# Patient Record
Sex: Male | Born: 1960 | Race: White | Hispanic: No | Marital: Married | Smoking: Current every day smoker
Health system: Southern US, Community
[De-identification: ages and names within clinical notes are randomized; demographics above are authoritative.]

## PROBLEM LIST (undated history)

## (undated) ENCOUNTER — Telehealth

## (undated) ENCOUNTER — Ambulatory Visit: Attending: Gastroenterology | Primary: Gastroenterology

## (undated) DIAGNOSIS — F419 Anxiety disorder, unspecified: Secondary | ICD-10-CM

## (undated) DIAGNOSIS — I10 Essential (primary) hypertension: Secondary | ICD-10-CM

## (undated) DIAGNOSIS — B1910 Unspecified viral hepatitis B without hepatic coma: Secondary | ICD-10-CM

## (undated) DIAGNOSIS — G2581 Restless legs syndrome: Secondary | ICD-10-CM

## (undated) DIAGNOSIS — T8859XA Other complications of anesthesia, initial encounter: Secondary | ICD-10-CM

## (undated) DIAGNOSIS — R0602 Shortness of breath: Secondary | ICD-10-CM

## (undated) DIAGNOSIS — R55 Syncope and collapse: Secondary | ICD-10-CM

## (undated) DIAGNOSIS — J189 Pneumonia, unspecified organism: Secondary | ICD-10-CM

## (undated) DIAGNOSIS — K219 Gastro-esophageal reflux disease without esophagitis: Secondary | ICD-10-CM

## (undated) DIAGNOSIS — B1920 Unspecified viral hepatitis C without hepatic coma: Secondary | ICD-10-CM

## (undated) DIAGNOSIS — T4145XA Adverse effect of unspecified anesthetic, initial encounter: Secondary | ICD-10-CM

## (undated) DIAGNOSIS — T148XXD Other injury of unspecified body region, subsequent encounter: Secondary | ICD-10-CM

## (undated) DIAGNOSIS — R51 Headache: Secondary | ICD-10-CM

## (undated) DIAGNOSIS — M1990 Unspecified osteoarthritis, unspecified site: Secondary | ICD-10-CM

## (undated) DIAGNOSIS — S0571XA Avulsion of right eye, initial encounter: Secondary | ICD-10-CM

## (undated) DIAGNOSIS — K7460 Unspecified cirrhosis of liver: Secondary | ICD-10-CM

## (undated) HISTORY — PX: PERIPHERALLY INSERTED CENTRAL CATHETER INSERTION: SHX2221

## (undated) HISTORY — PX: OTHER SURGICAL HISTORY: SHX169

## (undated) HISTORY — PX: ANKLE SURGERY: SHX546

## (undated) HISTORY — PX: APPENDECTOMY: SHX54

## (undated) HISTORY — PX: EYE SURGERY: SHX253

---

## 1999-08-30 ENCOUNTER — Encounter: Payer: Self-pay | Admitting: Emergency Medicine

## 1999-08-30 ENCOUNTER — Emergency Department (HOSPITAL_COMMUNITY): Admission: EM | Admit: 1999-08-30 | Discharge: 1999-08-30 | Payer: Self-pay | Admitting: Emergency Medicine

## 2008-08-04 ENCOUNTER — Emergency Department (HOSPITAL_COMMUNITY): Admission: EM | Admit: 2008-08-04 | Discharge: 2008-08-04 | Payer: Self-pay | Admitting: Emergency Medicine

## 2008-08-09 ENCOUNTER — Emergency Department (HOSPITAL_COMMUNITY): Admission: EM | Admit: 2008-08-09 | Discharge: 2008-08-09 | Payer: Self-pay | Admitting: Emergency Medicine

## 2008-08-26 ENCOUNTER — Emergency Department (HOSPITAL_COMMUNITY): Admission: EM | Admit: 2008-08-26 | Discharge: 2008-08-26 | Payer: Self-pay | Admitting: Emergency Medicine

## 2008-09-03 ENCOUNTER — Emergency Department (HOSPITAL_COMMUNITY): Admission: EM | Admit: 2008-09-03 | Discharge: 2008-09-03 | Payer: Self-pay | Admitting: Emergency Medicine

## 2008-09-15 ENCOUNTER — Emergency Department (HOSPITAL_COMMUNITY): Admission: EM | Admit: 2008-09-15 | Discharge: 2008-09-15 | Payer: Self-pay | Admitting: Emergency Medicine

## 2008-09-20 ENCOUNTER — Emergency Department (HOSPITAL_COMMUNITY): Admission: EM | Admit: 2008-09-20 | Discharge: 2008-09-20 | Payer: Self-pay | Admitting: Emergency Medicine

## 2008-09-27 ENCOUNTER — Emergency Department (HOSPITAL_COMMUNITY): Admission: EM | Admit: 2008-09-27 | Discharge: 2008-09-28 | Payer: Self-pay | Admitting: Emergency Medicine

## 2008-09-29 ENCOUNTER — Encounter: Payer: Self-pay | Admitting: Internal Medicine

## 2008-10-23 ENCOUNTER — Emergency Department (HOSPITAL_COMMUNITY): Admission: EM | Admit: 2008-10-23 | Discharge: 2008-10-23 | Payer: Self-pay | Admitting: Emergency Medicine

## 2008-11-19 ENCOUNTER — Emergency Department (HOSPITAL_COMMUNITY): Admission: EM | Admit: 2008-11-19 | Discharge: 2008-11-19 | Payer: Self-pay | Admitting: Emergency Medicine

## 2008-12-12 ENCOUNTER — Emergency Department (HOSPITAL_COMMUNITY): Admission: EM | Admit: 2008-12-12 | Discharge: 2008-12-12 | Payer: Self-pay | Admitting: Emergency Medicine

## 2010-06-04 ENCOUNTER — Inpatient Hospital Stay (HOSPITAL_COMMUNITY)
Admission: EM | Admit: 2010-06-04 | Discharge: 2010-06-10 | Payer: Self-pay | Source: Home / Self Care | Admitting: Emergency Medicine

## 2010-06-18 ENCOUNTER — Emergency Department (HOSPITAL_COMMUNITY): Admission: EM | Admit: 2010-06-18 | Discharge: 2010-06-18 | Payer: Self-pay | Admitting: Emergency Medicine

## 2010-07-10 ENCOUNTER — Emergency Department (HOSPITAL_COMMUNITY): Admission: EM | Admit: 2010-07-10 | Discharge: 2010-07-10 | Payer: Self-pay | Admitting: Emergency Medicine

## 2010-07-29 ENCOUNTER — Emergency Department (HOSPITAL_COMMUNITY)
Admission: EM | Admit: 2010-07-29 | Discharge: 2010-07-29 | Payer: Self-pay | Source: Home / Self Care | Admitting: Emergency Medicine

## 2010-10-13 ENCOUNTER — Emergency Department (HOSPITAL_COMMUNITY)
Admission: EM | Admit: 2010-10-13 | Discharge: 2010-10-13 | Payer: Self-pay | Source: Home / Self Care | Admitting: Emergency Medicine

## 2010-10-16 LAB — COMPREHENSIVE METABOLIC PANEL
ALT: 89 U/L — ABNORMAL HIGH (ref 0–53)
AST: 104 U/L — ABNORMAL HIGH (ref 0–37)
Alkaline Phosphatase: 144 U/L — ABNORMAL HIGH (ref 39–117)
CO2: 21 mEq/L (ref 19–32)
Calcium: 9.1 mg/dL (ref 8.4–10.5)
GFR calc Af Amer: >60 mL/min (ref >60)
Glucose, Bld: 95 mg/dL (ref 70–99)
Potassium: 4 mEq/L (ref 3.5–5.1)
Sodium: 136 mEq/L (ref 135–145)
Total Protein: 8 g/dL (ref 6.0–8.3)

## 2010-10-16 LAB — DIFFERENTIAL
Eosinophils Relative: 1 % (ref 0–5)
Lymphocytes Relative: 5 % — ABNORMAL LOW (ref 12–46)
Lymphs Abs: 0.4 10*3/uL — ABNORMAL LOW (ref 0.7–4.0)
Monocytes Absolute: 0.4 10*3/uL (ref 0.1–1.0)
Neutro Abs: 6.4 10*3/uL (ref 1.7–7.7)

## 2010-10-16 LAB — CBC
HCT: 44.1 % (ref 39.0–52.0)
Hemoglobin: 15.4 g/dL (ref 13.0–17.0)
MCHC: 34.9 g/dL (ref 30.0–36.0)
MCV: 89.5 fL (ref 78.0–100.0)
RDW: 14.7 % (ref 11.5–15.5)

## 2010-10-16 LAB — RAPID URINE DRUG SCREEN, HOSP PERFORMED
Amphetamines: NOT DETECTED
Barbiturates: NOT DETECTED
Benzodiazepines: NOT DETECTED
Cocaine: POSITIVE — AB
Opiates: POSITIVE — AB
Tetrahydrocannabinol: NOT DETECTED

## 2010-10-16 LAB — URINALYSIS, ROUTINE W REFLEX MICROSCOPIC
Bilirubin Urine: NEGATIVE
Hgb urine dipstick: NEGATIVE
Specific Gravity, Urine: 1.02 (ref 1.005–1.030)
Urine Glucose, Fasting: NEGATIVE mg/dL

## 2010-10-16 LAB — PROTIME-INR: INR: 1.02 (ref 0.00–1.49)

## 2010-10-16 LAB — AMMONIA: Ammonia: 44 umol/L — ABNORMAL HIGH (ref 11–35)

## 2010-10-16 LAB — ETHANOL: Alcohol, Ethyl (B): <5 mg/dL (ref 0–10)

## 2010-10-20 LAB — CULTURE, BLOOD (ROUTINE X 2)
Culture  Setup Time: 201201210239
Culture: NO GROWTH

## 2010-11-13 ENCOUNTER — Inpatient Hospital Stay (HOSPITAL_COMMUNITY)
Admission: EM | Admit: 2010-11-13 | Discharge: 2010-11-17 | DRG: 513 | Disposition: A | Discharge status: Home Health | Payer: Medicaid Other | Attending: Plastic Surgery | Admitting: Plastic Surgery

## 2010-11-13 ENCOUNTER — Emergency Department (HOSPITAL_COMMUNITY): Payer: Medicaid Other

## 2010-11-13 DIAGNOSIS — F3289 Other specified depressive episodes: Secondary | ICD-10-CM | POA: Diagnosis present

## 2010-11-13 DIAGNOSIS — M65839 Other synovitis and tenosynovitis, unspecified forearm: Principal | ICD-10-CM | POA: Diagnosis present

## 2010-11-13 DIAGNOSIS — Z8619 Personal history of other infectious and parasitic diseases: Secondary | ICD-10-CM

## 2010-11-13 DIAGNOSIS — R112 Nausea with vomiting, unspecified: Secondary | ICD-10-CM | POA: Diagnosis not present

## 2010-11-13 DIAGNOSIS — W268XXA Contact with other sharp object(s), not elsewhere classified, initial encounter: Secondary | ICD-10-CM | POA: Diagnosis present

## 2010-11-13 DIAGNOSIS — G2581 Restless legs syndrome: Secondary | ICD-10-CM | POA: Diagnosis present

## 2010-11-13 DIAGNOSIS — R197 Diarrhea, unspecified: Secondary | ICD-10-CM | POA: Diagnosis not present

## 2010-11-13 DIAGNOSIS — I10 Essential (primary) hypertension: Secondary | ICD-10-CM | POA: Diagnosis present

## 2010-11-13 DIAGNOSIS — K7460 Unspecified cirrhosis of liver: Secondary | ICD-10-CM | POA: Diagnosis present

## 2010-11-13 DIAGNOSIS — F329 Major depressive disorder, single episode, unspecified: Secondary | ICD-10-CM | POA: Diagnosis present

## 2010-11-13 DIAGNOSIS — S61209A Unspecified open wound of unspecified finger without damage to nail, initial encounter: Secondary | ICD-10-CM | POA: Diagnosis present

## 2010-11-13 DIAGNOSIS — M65849 Other synovitis and tenosynovitis, unspecified hand: Principal | ICD-10-CM | POA: Diagnosis present

## 2010-11-13 LAB — BASIC METABOLIC PANEL
BUN: 20 mg/dL (ref 6–23)
Creatinine, Ser: 0.99 mg/dL (ref 0.4–1.5)
GFR calc Af Amer: >60 mL/min (ref >60)
GFR calc non Af Amer: >60 mL/min (ref >60)
Potassium: 3.6 mEq/L (ref 3.5–5.1)

## 2010-11-13 LAB — CBC
MCV: 89.4 fL (ref 78.0–100.0)
Platelets: 124 10*3/uL — ABNORMAL LOW (ref 150–400)
RDW: 14.1 % (ref 11.5–15.5)
WBC: 8.5 10*3/uL (ref 4.0–10.5)

## 2010-11-13 LAB — DIFFERENTIAL
Basophils Absolute: 0 10*3/uL (ref 0.0–0.1)
Basophils Relative: 0 % (ref 0–1)
Eosinophils Absolute: 0.2 10*3/uL (ref 0.0–0.7)
Eosinophils Relative: 2 % (ref 0–5)
Lymphs Abs: 2 10*3/uL (ref 0.7–4.0)
Neutrophils Relative %: 68 % (ref 43–77)

## 2010-11-14 LAB — CBC
HCT: 40.8 % (ref 39.0–52.0)
Platelets: 98 10*3/uL — ABNORMAL LOW (ref 150–400)
RBC: 4.54 MIL/uL (ref 4.22–5.81)
RDW: 14.2 % (ref 11.5–15.5)
WBC: 6.4 10*3/uL (ref 4.0–10.5)

## 2010-11-14 LAB — RAPID URINE DRUG SCREEN, HOSP PERFORMED: Benzodiazepines: POSITIVE — AB

## 2010-11-14 LAB — MRSA PCR SCREENING: MRSA by PCR: NEGATIVE

## 2010-11-15 LAB — CBC
Hemoglobin: 14.3 g/dL (ref 13.0–17.0)
MCV: 90.1 fL (ref 78.0–100.0)
Platelets: 117 10*3/uL — ABNORMAL LOW (ref 150–400)
RBC: 4.74 MIL/uL (ref 4.22–5.81)
WBC: 6 10*3/uL (ref 4.0–10.5)

## 2010-11-17 LAB — CBC
HCT: 45.3 % (ref 39.0–52.0)
MCHC: 33.8 g/dL (ref 30.0–36.0)
MCV: 90.4 fL (ref 78.0–100.0)
Platelets: 142 10*3/uL — ABNORMAL LOW (ref 150–400)
RDW: 13.8 % (ref 11.5–15.5)
WBC: 5.4 10*3/uL (ref 4.0–10.5)

## 2010-11-17 LAB — WOUND CULTURE

## 2010-11-17 NOTE — Consult Note (Signed)
NAMEJEOVANNI, HEURING NO.:  [id number]  MEDICAL RECORD NO.:  [id number]           PATIENT TYPE:  I  LOCATION:  1305                         FACILITY:  [facility]  PHYSICIAN:  Hillery Aldo, M.D.   DATE OF BIRTH:  07-18-1961  DATE OF CONSULTATION:  11/14/2010 DATE OF DISCHARGE:                                CONSULTATION   PERSON REQUESTING CONSULTATION:  Dr. Noelle Penner.  REASON FOR CONSULTATION:  Medical management.  HISTORY OF PRESENT ILLNESS:  The patient is a 50 year old male, with past medical history of cirrhosis and necrotizing fasciitis involving the right forearm, who was admitted by the orthopedic service yesterday secondary to cellulitis and right index finger trauma.  The patient was apparently working at a job site and reached down to pick something up and sustained a small cut, which subsequently began to swell and drained pus.  Subsequently was admitted by Dr. Noelle Penner of the orthopedic service and diagnosed with right index finger flexor tenosynovitis with foreign body.  He is now status post incision and drainage and is stable postoperatively, receiving wound care with whirlpool therapy.  The orthopedic service did ask for Internal Medicine input regarding the management of his chronic medical problems, including cirrhosis.  PAST MEDICAL HISTORY: 1. Necrotizing fasciitis/cellulitis/abscess of the right forearm     requiring three separate incision and drainage procedures.     Cultures grew microaerophilic strep. 2. 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 3. History of hypertension. 4. Gastroesophageal reflux disease. 5. 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 6. 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 7. Appendectomy.  SOCIAL HISTORY:  The patient is married and has worked as a Pensions consultant.  He states that he is attempting to apply for disability.  He smokes a half- a-pack of tobacco  daily.  He denies current alcohol and drug use, but has a history of heavy drug use including cocaine.  Of note, the patient was admitted back in November and had a urine drug screen positive for cocaine at that time.  FAMILY HISTORY:  The patient's mother is alive at 31 and is healthy. The patient's father died at 74 from pneumonia.  He was also alcoholic. He has three siblings who died of a crib death early in life.  He has two sisters who are alive and healthy.  CURRENT MEDICATIONS: 1. Amitriptyline 25 mg p.o. daily. 2. Hydroxyzine 25 mg p.o. q.h.s. 3. Inderal 60 mg p.o. daily. 4. Lactulose 10 g 1 to 2 teaspoons t.i.d. 5. Nexium 40 mg p.o. b.i.d. 6. Ropinirole 3 mg p.o. q.h.s. 7. Spironolactone 50 mg p.o. daily.  PHYSICAL EXAM:  VITAL SIGNS:  Temperature 97.8, blood pressure 140/88, pulse 87, respirations 20, O2 saturation 95% on room air. GENERAL:  Well-developed, well-nourished male who is in no acute distress. HEENT:  Normocephalic, atraumatic.  EOMI.  Right cornea is opaque.  Left pupil reacts to light.  Oropharynx is clear. NECK:  Supple, no thyromegaly, no lymphadenopathy, no jugular venous distention. CHEST:  Lungs clear to auscultation bilaterally with good air movement. HEART:  Regular rate, rhythm.  No murmurs, rubs, gallops. ABDOMEN:  Soft, nontender, nondistended.  Normoactive bowel sounds. EXTREMITIES:  Trace edema bilaterally.  He does have two incisions in the right hand, one to the right index finger and the other in the palm of the hand, that are clean and are being actively addressed by the Wound Care staff. SKIN:  Warm and dry.  No rashes.  Multiple tattoos. NEUROLOGIC:  The patient is alert and oriented x3.  Cranial nerves 2-12 grossly intact.  Nonfocal.  ASSESSMENT/PLAN: 1. Flexor tenosynovitis of the right index finger status post incision     and drainage:  The patient will be continued on antibiotics and     wound care per the orthopedic  service. 2. 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 3. Gastroesophageal reflux disease:  We will resume the patient's     proton pump inhibitor therapy. 4. History of hypertension:  The patient will be resumed on his     spironolactone. 5. History of drug abuse:  Check a urine drug screen, as the patient     is currently denying active use but he does have a cocaine-positive     urine documented back in November.  If positive, would refer to an     outpatient chemical dependency program.  Thank you for this consultation.  We will follow the patient with you.  Time spent on consultation including face-to-face time:  Approximately 1 hour.     Hillery Aldo, M.D.     CR/MEDQ  D:  11/14/2010  T:  11/14/2010  Job:  [id number]  Electronically Signed by Hillery Aldo M.D. on 11/17/2010 03:13:12 PM

## 2010-11-19 LAB — ANAEROBIC CULTURE

## 2010-11-22 NOTE — Discharge Summary (Signed)
NAMEKADIN, CANIPE NO.:  [id number]  MEDICAL RECORD NO.:  [id number]           PATIENT TYPE:  I  LOCATION:  1305                         FACILITY:  [facility]  PHYSICIAN:  Loreta Ave, MD DATE OF BIRTH:  [birth year]  DATE OF ADMISSION:  11/13/2010 DATE OF DISCHARGE:  11/17/2010                              DISCHARGE SUMMARY   PRIMARY ADMITTING DIAGNOSES: 1. Right index finger flexor tenosynovitis. 2. Right index finger foreign body.  ADDITIONAL DISCHARGE DIAGNOSES: 1. Cirrhosis. 2. GERD. 3. Hypertension. 4. Depression.  LABORATORY DATA:  CBC on October 13, 2010; white blood count was 8.5, hemoglobin of 14.9, hematocrit was 43.4, platelets 142,000.  CBC on November 17, 2010; white blood count of 5.4, hemoglobin of 15.3, hematocrit of 45.3, platelets 142,000.  On November 16, 2010, C diff negative.  RADIOLOGY:  Right hand 2-view, right index finger metallic foreign body. Micro wound cultures, right index finger; few staph aureus, blood cultures negative, anaerobic cultures negative.  SURGERY:  On October 14, 2010, I&D, right index finger and palm, and removal of foreign body.  CONSULTS:  Internal medicine consult on October 14, 2010 for medical management.  BRIEF HISTORY:  Mr. Espin is a 50 year old right-hand dominant male who cut his index finger on the volar aspect of the middle phalanx a few days prior to admission on a septic pipe.  He had increasing swelling, pain, and redness of the index finger with symptoms that were suggestive of flexor tenosynovitis of the index finger into the palm.  At that time, he presented to the emergency room and was evaluated by Dr. Noelle Penner for recommendations regarding his right hand due to the suggestive symptoms of the flexor tenosynovitis.  He was taken to the operating room for a debridement of the right hand and removal of foreign body in his right index finger and cultures were also obtained at that  time.  IV antibiotics were initiated with clindamycin and Unasyn.  He tolerated procedure well, was transferred to the recovery room in stable condition and later transferred to the medical-surgical unit.  He was also seen by internal medicine for medical management on November 14, 2010, he remained on clindamycin and Unasyn as well as PCA Dilaudid.  He was also started on whirlpool by physical therapist, which was continued throughout his hospitalization.  On postoperative day #1, his PCA and IV antibiotics were changed to p.o. clindamycin, Augmentin, and oxycodone. His right hand wounds were cleaned, with persistent erythema noted in his right index finger.  This continued to be packed with Iodoform gauze daily.  On postoperative day #2, he was complaining of nausea, vomiting, and diarrhea and stool samples were obtained which revealed no C difficile.  Augmentin was discontinued.  On postoperative day #3, he denied nausea, vomiting, diarrhea, was tolerating a regular diet, was afebrile, and voiding spontaneously, and managing all medications by mouth.  His right hand wound had decreased erythema and decreased swelling and no pus and minimal exudate noted from the wounds.  On postoperative day #3, he was deemed appropriate for discharge home.  DISCHARGE INSTRUCTIONS: 1. DIET:  As tolerated. 2. ACTIVITY:  As tolerated.  He may shower.  No driving while taking     narcotics. 3. FOLLOWUP:  He is to follow up with Dr. Noelle Penner on November 23, 2010 and     call to make an appointment at [phone].  He is also to follow up     with his primary physicians [facility] for followup     after hospitalization. 4. HOME HEALTH:  [facility] will follow for wound care.  DISCHARGE MEDICATIONS: 1. Clindamycin 150 mg p.o. q.8h. 2. Oxycodone 5 to 10 mg immediate release q.3h. p.r.n. pain. 3. Ropinirole 5 mg q.h.s. 4. Hydroxyzine 50 mg 2 tablets q.h.s. 5. Spironolactone 50 mg 1 tablet  b.i.d. 6. Nexium 1 tablet b.i.d. 7. Amitriptyline 25 mg 1 tablet q.h.s. 8. Fluoxetine 20 mg 1 tablet p.o. daily. 9. Lactulose 3 tablespoons p.o. b.i.d.  WOUND CARE INSTRUCTIONS:  He is to pack the right hand wounds with Iodoform and cover with dry gauze once daily. He is instructed to call Dr. Noelle Penner if he experiences any nausea, vomiting, uncontrollable pain, or any concerns he has about his right hand.  Mr. Overholser has met maximum benefit from inpatient therapy and is medically stable to be discharged home.  CONDITION:  At the time of discharge, is stable.     Tonye Becket, NP   ______________________________ Loreta Ave, MD    AC/MEDQ  D:  11/20/2010  T:  11/20/2010  Job:  [id number]  Electronically Signed by Loreta Ave MD on 11/22/2010 08:22:59 PM

## 2010-11-22 NOTE — Op Note (Signed)
NAMEGIORDAN, FORDHAM NO.:  [id number]  MEDICAL RECORD NO.:  [id number]           PATIENT TYPE:  E  LOCATION:  WLED                         FACILITY:  [facility]  PHYSICIAN:  Loreta Ave, MD DATE OF BIRTH:  Aug 15, 1961  DATE OF PROCEDURE:  11/14/2010 DATE OF DISCHARGE:                              OPERATIVE REPORT   PREOPERATIVE DIAGNOSES: 1. Right index finger flexor tenosynovitis. 2. Right index finger foreign body.  POSTOPERATIVE DIAGNOSES: 1. Right index finger flexor tenosynovitis. 2. Right index finger foreign body.  PROCEDURE PERFORMED: 1. Incision and drainage of tendon sheath right index finger and palm. 2. Removal of foreign body right index finger tendon sheath.  TOURNIQUET TIME:  20 minutes at 275 mmHg.  ESTIMATED BLOOD LOSS:  Minimal.  IV FLUIDS:  Per Anesthesia.  URINE OUTPUT:  Not recorded.  SPECIMENS:  Cultures were taken from the index finger and sent for microbiology. CLINICAL INDICATION:  Mark Christensen is a 50 year old right-hand-dominant male who cut his right index finger on the volar aspect of the middle phalanx a few days ago.  He has had increasing pain, swelling, and erythema of the index finger and symptoms suggestive of flexor tenosynovitis of the index finger into the palm.  He understands the risks of the surgery to include but not be limited to bleeding, infection, damage to the nearby structures, stiffness, scarring, and the need for more surgery including potentially amputations.  He desires to proceed.  DESCRIPTION OF OPERATION:  The patient was brought to the operating room, placed in supine position on the operating room table.  After smooth and routine induction of general anesthesia, a surgical time-out was performed.  The right upper extremity had a well-padded pneumatic tourniquet placed on the arm.  The right upper extremity was then prepped with Betadine scrub and paint, draped into a sterile field.   It was elevated for 1 minute with compression of the brachial artery and the antecubital fossa and the tourniquet was then inflated.  Attention was first turned to the level of the palm to evaluate the proximal spread of infection.  A 1.5-cm oblique incision was made with a 15 blade overlying the A1 pulley.  Blunt dissection was carried down to the A1 pulley and this was incised in its entirety with tenotomy scissors. There is no return of fluid at this location.  Next, attention was turned to the index finger.  The wound overlying the middle phalanx which was approximately 2 x 7 mm of dry eschar was excised with a 15 blade.  This was extended towards the radial border of the finger with a 15 blade.  Next,  blunt dissection was carried down to the level of the tendon sheath and a small metallic foreign body was encountered and imbedded in the tendon sheath.  This was removed.  Next, the tendon sheath was incised and there was a return of turbid brown fluid.  This was cultured and sent for microbiology for Gram stain and aerobic and anaerobic cultures.  Next, the index finger and palmar wounds were irrigated with 1 liter of normal saline and a syringe was used  to irrigate the tendon sheath from both directions.  Next,  the wounds were packed with quarter inch Iodoform gauze, and the wounds were covered with 4x4s and Kerlix wrap and the patient was placed in the volar resting splint in the position of function.  Sponge and needle counts reported as correct x2.  The tourniquet was then deflated with prompt return of capillary refill to all fingertips of the right hand.     Loreta Ave, MD     CF/MEDQ  D:  11/14/2010  T:  11/14/2010  Job:  [id number]  Electronically Signed by Loreta Ave MD on 11/22/2010 08:22:56 PM

## 2010-12-05 LAB — DIFFERENTIAL
Basophils Absolute: 0 10*3/uL (ref 0.0–0.1)
Basophils Relative: 0 % (ref 0–1)
Eosinophils Absolute: 0.2 K/uL (ref 0.0–0.7)
Eosinophils Relative: 3 % (ref 0–5)
Lymphocytes Relative: 37 % (ref 12–46)
Lymphs Abs: 1.9 K/uL (ref 0.7–4.0)
Monocytes Absolute: 0.5 10*3/uL (ref 0.1–1.0)
Monocytes Relative: 9 % (ref 3–12)
Neutro Abs: 2.6 10*3/uL (ref 1.7–7.7)
Neutrophils Relative %: 51 % (ref 43–77)

## 2010-12-05 LAB — BASIC METABOLIC PANEL WITH GFR
CO2: 25 meq/L (ref 19–32)
Chloride: 107 meq/L (ref 96–112)
GFR calc Af Amer: >60 mL/min (ref >60)
Sodium: 141 meq/L (ref 135–145)

## 2010-12-05 LAB — CBC
HCT: 45.7 % (ref 39.0–52.0)
Hemoglobin: 15.6 g/dL (ref 13.0–17.0)
MCH: 31.3 pg (ref 26.0–34.0)
MCHC: 34.2 g/dL (ref 30.0–36.0)
MCV: 91.5 fL (ref 78.0–100.0)
Platelets: 147 10*3/uL — ABNORMAL LOW (ref 150–400)
RBC: 4.99 MIL/uL (ref 4.22–5.81)
RDW: 14.1 % (ref 11.5–15.5)
WBC: 5.2 10*3/uL (ref 4.0–10.5)

## 2010-12-05 LAB — BASIC METABOLIC PANEL
BUN: 13 mg/dL (ref 6–23)
Calcium: 9.7 mg/dL (ref 8.4–10.5)
Creatinine, Ser: 0.9 mg/dL (ref 0.4–1.5)
GFR calc non Af Amer: >60 mL/min (ref >60)
Glucose, Bld: 92 mg/dL (ref 70–99)
Potassium: 4.4 mEq/L (ref 3.5–5.1)

## 2010-12-05 LAB — SEDIMENTATION RATE: Sed Rate: 18 mm/hr — ABNORMAL HIGH (ref 0–16)

## 2010-12-06 LAB — DIFFERENTIAL
Basophils Absolute: 0 10*3/uL (ref 0.0–0.1)
Basophils Relative: 1 % (ref 0–1)
Eosinophils Relative: 3 % (ref 0–5)
Monocytes Absolute: 0.4 10*3/uL (ref 0.1–1.0)

## 2010-12-06 LAB — GLUCOSE, CAPILLARY: Glucose-Capillary: 134 mg/dL — ABNORMAL HIGH (ref 70–99)

## 2010-12-06 LAB — URINALYSIS, ROUTINE W REFLEX MICROSCOPIC
Hgb urine dipstick: NEGATIVE
Nitrite: NEGATIVE
Protein, ur: NEGATIVE mg/dL
Specific Gravity, Urine: 1.02 (ref 1.005–1.030)
Urobilinogen, UA: 1 mg/dL (ref 0.0–1.0)

## 2010-12-06 LAB — CBC
HCT: 42.5 % (ref 39.0–52.0)
MCHC: 34.9 g/dL (ref 30.0–36.0)
MCV: 91.5 fL (ref 78.0–100.0)
Platelets: 140 10*3/uL — ABNORMAL LOW (ref 150–400)
RDW: 13.1 % (ref 11.5–15.5)

## 2010-12-06 LAB — AMMONIA: Ammonia: 38 umol/L — ABNORMAL HIGH (ref 11–35)

## 2010-12-06 LAB — BASIC METABOLIC PANEL
BUN: 12 mg/dL (ref 6–23)
Chloride: 112 mEq/L (ref 96–112)
Creatinine, Ser: 0.7 mg/dL (ref 0.4–1.5)
GFR calc non Af Amer: >60 mL/min (ref >60)
Glucose, Bld: 111 mg/dL — ABNORMAL HIGH (ref 70–99)
Potassium: 4.9 mEq/L (ref 3.5–5.1)

## 2010-12-06 LAB — HEPATIC FUNCTION PANEL
Bilirubin, Direct: 0.3 mg/dL (ref 0.0–0.3)
Indirect Bilirubin: 0.6 mg/dL (ref 0.3–0.9)
Total Protein: 8.4 g/dL — ABNORMAL HIGH (ref 6.0–8.3)

## 2010-12-06 LAB — LIPASE, BLOOD: Lipase: 28 U/L (ref 11–59)

## 2010-12-07 LAB — PROTIME-INR
INR: 1.08 (ref 0.00–1.49)
Prothrombin Time: 14.2 seconds (ref 11.6–15.2)

## 2010-12-07 LAB — DIFFERENTIAL
Basophils Relative: 0 % (ref 0–1)
Basophils Relative: 0 % (ref 0–1)
Basophils Relative: 0 % (ref 0–1)
Basophils Relative: 1 % (ref 0–1)
Eosinophils Absolute: 0.1 10*3/uL (ref 0.0–0.7)
Eosinophils Absolute: 0.1 10*3/uL (ref 0.0–0.7)
Eosinophils Absolute: 0.1 10*3/uL (ref 0.0–0.7)
Eosinophils Absolute: 0.2 10*3/uL (ref 0.0–0.7)
Eosinophils Relative: 5 % (ref 0–5)
Lymphocytes Relative: 45 % (ref 12–46)
Lymphs Abs: 2.9 10*3/uL (ref 0.7–4.0)
Monocytes Absolute: 0.6 10*3/uL (ref 0.1–1.0)
Monocytes Absolute: 0.6 10*3/uL (ref 0.1–1.0)
Monocytes Absolute: 0.9 10*3/uL (ref 0.1–1.0)
Monocytes Absolute: 1 10*3/uL (ref 0.1–1.0)
Monocytes Relative: 8 % (ref 3–12)
Monocytes Relative: 9 % (ref 3–12)
Monocytes Relative: 11 % (ref 3–12)
Monocytes Relative: 8 % (ref 3–12)
Neutro Abs: 7.1 10*3/uL (ref 1.7–7.7)
Neutro Abs: 7.3 10*3/uL (ref 1.7–7.7)
Neutrophils Relative %: 41 % — ABNORMAL LOW (ref 43–77)
Neutrophils Relative %: 63 % (ref 43–77)

## 2010-12-07 LAB — CULTURE, BLOOD (ROUTINE X 2): Culture: NO GROWTH

## 2010-12-07 LAB — CBC
HCT: 43.8 % (ref 39.0–52.0)
HCT: 44.2 % (ref 39.0–52.0)
HCT: 45.6 % (ref 39.0–52.0)
Hemoglobin: 13.3 g/dL (ref 13.0–17.0)
Hemoglobin: 15.5 g/dL (ref 13.0–17.0)
Hemoglobin: 15.5 g/dL (ref 13.0–17.0)
MCH: 32.2 pg (ref 26.0–34.0)
MCH: 32.2 pg (ref 26.0–34.0)
MCH: 32.5 pg (ref 26.0–34.0)
MCH: 32.9 pg (ref 26.0–34.0)
MCHC: 34.1 g/dL (ref 30.0–36.0)
MCHC: 34.6 g/dL (ref 30.0–36.0)
MCHC: 35 g/dL (ref 30.0–36.0)
MCV: 93.9 fL (ref 78.0–100.0)
MCV: 94 fL (ref 78.0–100.0)
RBC: 4.2 MIL/uL — ABNORMAL LOW (ref 4.22–5.81)
RBC: 4.67 MIL/uL (ref 4.22–5.81)
RDW: 13.3 % (ref 11.5–15.5)
RDW: 13.4 % (ref 11.5–15.5)
WBC: 6.4 10*3/uL (ref 4.0–10.5)
WBC: 8.1 10*3/uL (ref 4.0–10.5)

## 2010-12-07 LAB — COMPREHENSIVE METABOLIC PANEL
ALT: 36 U/L (ref 0–53)
ALT: 40 U/L (ref 0–53)
AST: 35 U/L (ref 0–37)
Albumin: 3.6 g/dL (ref 3.5–5.2)
Alkaline Phosphatase: 106 U/L (ref 39–117)
BUN: 7 mg/dL (ref 6–23)
BUN: 8 mg/dL (ref 6–23)
CO2: 22 mEq/L (ref 19–32)
CO2: 24 mEq/L (ref 19–32)
Calcium: 8.4 mg/dL (ref 8.4–10.5)
Calcium: 8.7 mg/dL (ref 8.4–10.5)
Creatinine, Ser: 0.74 mg/dL (ref 0.4–1.5)
GFR calc Af Amer: >60 mL/min (ref >60)
GFR calc non Af Amer: >60 mL/min (ref >60)
GFR calc non Af Amer: >60 mL/min (ref >60)
Glucose, Bld: 106 mg/dL — ABNORMAL HIGH (ref 70–99)
Glucose, Bld: 105 mg/dL — ABNORMAL HIGH (ref 70–99)
Potassium: 4.3 mEq/L (ref 3.5–5.1)
Sodium: 135 mEq/L (ref 135–145)
Sodium: 132 mEq/L — ABNORMAL LOW (ref 135–145)
Total Protein: 8.6 g/dL — ABNORMAL HIGH (ref 6.0–8.3)
Total Protein: 8 g/dL (ref 6.0–8.3)

## 2010-12-07 LAB — BENZODIAZEPINE, QUANTITATIVE, URINE
Alprazolam (GC/LC/MS), ur confirm: NEGATIVE NG/ML
Nordiazepam GC/MS Conf: NEGATIVE NG/ML
Temazepam GC/MS Conf: NEGATIVE NG/ML

## 2010-12-07 LAB — URINE DRUGS OF ABUSE SCREEN W ALC, ROUTINE (REF LAB)
Barbiturate Quant, Ur: NEGATIVE
Creatinine,U: 220.2 mg/dL
Marijuana Metabolite: NEGATIVE
Opiate Screen, Urine: POSITIVE — AB

## 2010-12-07 LAB — BASIC METABOLIC PANEL
CO2: 24 mEq/L (ref 19–32)
Calcium: 9 mg/dL (ref 8.4–10.5)
Creatinine, Ser: 0.89 mg/dL (ref 0.4–1.5)
GFR calc Af Amer: >60 mL/min (ref >60)
GFR calc non Af Amer: >60 mL/min (ref >60)
Glucose, Bld: 99 mg/dL (ref 70–99)
Potassium: 4.5 mEq/L (ref 3.5–5.1)
Sodium: 138 mEq/L (ref 135–145)
Sodium: 135 mEq/L (ref 135–145)

## 2010-12-07 LAB — URINALYSIS, ROUTINE W REFLEX MICROSCOPIC
Glucose, UA: NEGATIVE mg/dL
Hgb urine dipstick: NEGATIVE
pH: 7 (ref 5.0–8.0)

## 2010-12-07 LAB — TISSUE CULTURE

## 2010-12-07 LAB — ANAEROBIC CULTURE

## 2010-12-07 LAB — OPIATE, QUANTITATIVE, URINE
Hydrocodone: NEGATIVE NG/ML
Morphine, Confirm: 1679 NG/ML — ABNORMAL HIGH

## 2010-12-07 LAB — WOUND CULTURE

## 2010-12-07 LAB — CULTURE, ROUTINE-ABSCESS

## 2010-12-07 LAB — GRAM STAIN

## 2010-12-07 LAB — LIPASE, BLOOD: Lipase: 24 U/L (ref 11–59)

## 2011-01-09 LAB — COMPREHENSIVE METABOLIC PANEL
BUN: 17 mg/dL (ref 6–23)
CO2: 23 mEq/L (ref 19–32)
Chloride: 108 mEq/L (ref 96–112)
Creatinine, Ser: 0.96 mg/dL (ref 0.4–1.5)
GFR calc non Af Amer: >60 mL/min (ref >60)
Glucose, Bld: 102 mg/dL — ABNORMAL HIGH (ref 70–99)
Total Bilirubin: 0.2 mg/dL — ABNORMAL LOW (ref 0.3–1.2)

## 2011-01-09 LAB — LIPASE, BLOOD: Lipase: 24 U/L (ref 11–59)

## 2011-01-09 LAB — POCT I-STAT, CHEM 8
BUN: 18 mg/dL (ref 6–23)
Calcium, Ion: 1.28 mmol/L (ref 1.12–1.32)
Chloride: 110 meq/L (ref 96–112)
Creatinine, Ser: 0.9 mg/dL (ref 0.4–1.5)
Glucose, Bld: 114 mg/dL — ABNORMAL HIGH (ref 70–99)
HCT: 40 % (ref 39.0–52.0)
Hemoglobin: 13.6 g/dL (ref 13.0–17.0)
Potassium: 3.8 mEq/L (ref 3.5–5.1)
Sodium: 143 mEq/L (ref 135–145)
TCO2: 24 mmol/L (ref 0–100)

## 2011-02-11 ENCOUNTER — Emergency Department (HOSPITAL_COMMUNITY)
Admission: EM | Admit: 2011-02-11 | Discharge: 2011-02-11 | Disposition: A | Discharge status: Home / Self Care | Payer: Medicaid Other | Attending: Emergency Medicine | Admitting: Emergency Medicine

## 2011-02-11 ENCOUNTER — Emergency Department (HOSPITAL_COMMUNITY): Payer: Medicaid Other

## 2011-02-11 DIAGNOSIS — I10 Essential (primary) hypertension: Secondary | ICD-10-CM | POA: Insufficient documentation

## 2011-02-11 DIAGNOSIS — K5289 Other specified noninfective gastroenteritis and colitis: Secondary | ICD-10-CM | POA: Insufficient documentation

## 2011-02-11 DIAGNOSIS — H5440 Blindness, one eye, unspecified eye: Secondary | ICD-10-CM | POA: Insufficient documentation

## 2011-02-11 DIAGNOSIS — R079 Chest pain, unspecified: Secondary | ICD-10-CM | POA: Insufficient documentation

## 2011-02-11 DIAGNOSIS — Z8619 Personal history of other infectious and parasitic diseases: Secondary | ICD-10-CM | POA: Insufficient documentation

## 2011-02-11 LAB — DIFFERENTIAL
Basophils Absolute: 0 10*3/uL (ref 0.0–0.1)
Eosinophils Relative: 0 % (ref 0–5)
Lymphocytes Relative: 22 % (ref 12–46)

## 2011-02-11 LAB — CBC
HCT: 42.8 % (ref 39.0–52.0)
Platelets: 68 10*3/uL — ABNORMAL LOW (ref 150–400)
RDW: 14.2 % (ref 11.5–15.5)
WBC: 3.7 10*3/uL — ABNORMAL LOW (ref 4.0–10.5)

## 2011-02-11 LAB — COMPREHENSIVE METABOLIC PANEL
Alkaline Phosphatase: 121 U/L — ABNORMAL HIGH (ref 39–117)
BUN: 14 mg/dL (ref 6–23)
CO2: 22 mEq/L (ref 19–32)
Chloride: 98 mEq/L (ref 96–112)
Glucose, Bld: 106 mg/dL — ABNORMAL HIGH (ref 70–99)
Potassium: 3.7 mEq/L (ref 3.5–5.1)
Total Bilirubin: 0.6 mg/dL (ref 0.3–1.2)

## 2011-02-11 LAB — POCT CARDIAC MARKERS
CKMB, poc: <1 ng/mL — ABNORMAL LOW (ref 1.0–8.0)
Myoglobin, poc: 54.4 ng/mL (ref 12–200)

## 2011-02-11 LAB — LIPASE, BLOOD: Lipase: 18 U/L (ref 11–59)

## 2011-02-11 LAB — URINALYSIS, ROUTINE W REFLEX MICROSCOPIC
Hgb urine dipstick: NEGATIVE
Protein, ur: NEGATIVE mg/dL
Urobilinogen, UA: 2 mg/dL — ABNORMAL HIGH (ref 0.0–1.0)

## 2011-06-29 LAB — URINE MICROSCOPIC-ADD ON

## 2011-06-29 LAB — URINALYSIS, ROUTINE W REFLEX MICROSCOPIC
Glucose, UA: NEGATIVE mg/dL
Hgb urine dipstick: NEGATIVE
Ketones, ur: 15 mg/dL — AB
Nitrite: NEGATIVE
Protein, ur: NEGATIVE mg/dL
Specific Gravity, Urine: >1.046 — ABNORMAL HIGH (ref 1.005–1.030)
pH: 6 (ref 5.0–8.0)

## 2011-06-29 LAB — DIFFERENTIAL
Basophils Relative: 0 % (ref 0–1)
Basophils Relative: 1 % (ref 0–1)
Lymphocytes Relative: 29 % (ref 12–46)
Lymphs Abs: 1.5 10*3/uL (ref 0.7–4.0)
Lymphs Abs: 1.9 10*3/uL (ref 0.7–4.0)
Monocytes Absolute: 0.7 10*3/uL (ref 0.1–1.0)
Monocytes Absolute: 0.7 10*3/uL (ref 0.1–1.0)
Monocytes Relative: 12 % (ref 3–12)
Monocytes Relative: 14 % — ABNORMAL HIGH (ref 3–12)
Neutro Abs: 2.7 10*3/uL (ref 1.7–7.7)
Neutro Abs: 3.5 10*3/uL (ref 1.7–7.7)

## 2011-06-29 LAB — COMPREHENSIVE METABOLIC PANEL
ALT: 67 U/L — ABNORMAL HIGH (ref 0–53)
AST: 44 U/L — ABNORMAL HIGH (ref 0–37)
Albumin: 3.4 g/dL — ABNORMAL LOW (ref 3.5–5.2)
Alkaline Phosphatase: 108 U/L (ref 39–117)
BUN: 16 mg/dL (ref 6–23)
CO2: 20 mEq/L (ref 19–32)
Calcium: 8.8 mg/dL (ref 8.4–10.5)
Calcium: 9.4 mg/dL (ref 8.4–10.5)
Chloride: 108 mEq/L (ref 96–112)
Creatinine, Ser: 0.78 mg/dL (ref 0.4–1.5)
GFR calc Af Amer: >60 mL/min (ref >60)
GFR calc non Af Amer: >60 mL/min (ref >60)
Glucose, Bld: 99 mg/dL (ref 70–99)
Potassium: 3.7 mEq/L (ref 3.5–5.1)
Sodium: 136 mEq/L (ref 135–145)
Total Bilirubin: 0.8 mg/dL (ref 0.3–1.2)
Total Protein: 6.5 g/dL (ref 6.0–8.3)

## 2011-06-29 LAB — CBC
HCT: 38.4 % — ABNORMAL LOW (ref 39.0–52.0)
Hemoglobin: 14.1 g/dL (ref 13.0–17.0)
MCHC: 33.5 g/dL (ref 30.0–36.0)
MCHC: 34.4 g/dL (ref 30.0–36.0)
MCV: 93.9 fL (ref 78.0–100.0)
Platelets: 105 10*3/uL — ABNORMAL LOW (ref 150–400)
RBC: 4.47 MIL/uL (ref 4.22–5.81)
RDW: 13.1 % (ref 11.5–15.5)
WBC: 5.1 10*3/uL (ref 4.0–10.5)

## 2011-06-29 LAB — APTT: aPTT: 36 seconds (ref 24–37)

## 2011-06-29 LAB — URINE CULTURE

## 2011-06-29 LAB — PROTIME-INR: INR: 1.1 (ref 0.00–1.49)

## 2011-11-08 ENCOUNTER — Emergency Department (HOSPITAL_COMMUNITY)
Admission: EM | Admit: 2011-11-08 | Discharge: 2011-11-09 | Disposition: A | Discharge status: Home / Self Care | Payer: Self-pay | Attending: Emergency Medicine | Admitting: Emergency Medicine

## 2011-11-08 ENCOUNTER — Encounter (HOSPITAL_COMMUNITY): Payer: Self-pay | Admitting: Emergency Medicine

## 2011-11-08 DIAGNOSIS — R609 Edema, unspecified: Secondary | ICD-10-CM | POA: Insufficient documentation

## 2011-11-08 DIAGNOSIS — R109 Unspecified abdominal pain: Secondary | ICD-10-CM | POA: Insufficient documentation

## 2011-11-08 DIAGNOSIS — R0602 Shortness of breath: Secondary | ICD-10-CM | POA: Insufficient documentation

## 2011-11-08 DIAGNOSIS — K7460 Unspecified cirrhosis of liver: Secondary | ICD-10-CM | POA: Insufficient documentation

## 2011-11-08 DIAGNOSIS — L738 Other specified follicular disorders: Secondary | ICD-10-CM | POA: Insufficient documentation

## 2011-11-08 DIAGNOSIS — F17200 Nicotine dependence, unspecified, uncomplicated: Secondary | ICD-10-CM | POA: Insufficient documentation

## 2011-11-08 DIAGNOSIS — Z8619 Personal history of other infectious and parasitic diseases: Secondary | ICD-10-CM | POA: Insufficient documentation

## 2011-11-08 DIAGNOSIS — L739 Follicular disorder, unspecified: Secondary | ICD-10-CM

## 2011-11-08 DIAGNOSIS — I10 Essential (primary) hypertension: Secondary | ICD-10-CM | POA: Insufficient documentation

## 2011-11-08 HISTORY — DX: Unspecified viral hepatitis B without hepatic coma: B19.10

## 2011-11-08 HISTORY — DX: Essential (primary) hypertension: I10

## 2011-11-08 HISTORY — DX: Unspecified cirrhosis of liver: K74.60

## 2011-11-08 NOTE — ED Notes (Signed)
Pt alert, nad, c/o swelling to lower ext, ascites, onset chronic in nature, hx hep b/c, resp even unlabored, skin pwd

## 2011-11-09 ENCOUNTER — Emergency Department (HOSPITAL_COMMUNITY): Payer: Self-pay

## 2011-11-09 LAB — BASIC METABOLIC PANEL
BUN: 20 mg/dL (ref 6–23)
CO2: 23 mEq/L (ref 19–32)
Chloride: 103 mEq/L (ref 96–112)
Glucose, Bld: 97 mg/dL (ref 70–99)
Potassium: 4 mEq/L (ref 3.5–5.1)
Sodium: 137 mEq/L (ref 135–145)

## 2011-11-09 LAB — HEPATIC FUNCTION PANEL
ALT: 23 U/L (ref 0–53)
AST: 28 U/L (ref 0–37)
Albumin: 3.6 g/dL (ref 3.5–5.2)
Bilirubin, Direct: 0.1 mg/dL (ref 0.0–0.3)
Total Bilirubin: 0.4 mg/dL (ref 0.3–1.2)

## 2011-11-09 LAB — PROTIME-INR: INR: 1.09 (ref 0.00–1.49)

## 2011-11-09 LAB — URINALYSIS, ROUTINE W REFLEX MICROSCOPIC
Glucose, UA: NEGATIVE mg/dL
Hgb urine dipstick: NEGATIVE
Ketones, ur: NEGATIVE mg/dL
Leukocytes, UA: NEGATIVE
Protein, ur: NEGATIVE mg/dL
pH: 6.5 (ref 5.0–8.0)

## 2011-11-09 LAB — DIFFERENTIAL
Lymphocytes Relative: 34 % (ref 12–46)
Lymphs Abs: 2.2 10*3/uL (ref 0.7–4.0)
Monocytes Relative: 11 % (ref 3–12)
Neutro Abs: 3.3 10*3/uL (ref 1.7–7.7)
Neutrophils Relative %: 50 % (ref 43–77)

## 2011-11-09 LAB — CBC
Hemoglobin: 15 g/dL (ref 13.0–17.0)
MCH: 31.1 pg (ref 26.0–34.0)
RBC: 4.82 MIL/uL (ref 4.22–5.81)
WBC: 6.7 10*3/uL (ref 4.0–10.5)

## 2011-11-09 LAB — APTT: aPTT: 39 seconds — ABNORMAL HIGH (ref 24–37)

## 2011-11-09 MED ORDER — FUROSEMIDE 20 MG PO TABS: 20.0000 mg | ORAL_TABLET | Freq: Two times a day (BID) | ORAL | Status: DC

## 2011-11-09 MED ORDER — SULFAMETHOXAZOLE-TRIMETHOPRIM 800-160 MG PO TABS: 1.0000 | ORAL_TABLET | Freq: Two times a day (BID) | ORAL | Status: AC

## 2011-11-09 MED ORDER — HYDROMORPHONE HCL PF 1 MG/ML IJ SOLN
1.0000 mg | Freq: Once | INTRAMUSCULAR | Status: AC
  Administered 2011-11-09: 1 mg via INTRAVENOUS
  Filled 2011-11-09: qty 1

## 2011-11-09 MED ORDER — ONDANSETRON HCL 4 MG/2ML IJ SOLN
4.0000 mg | Freq: Once | INTRAMUSCULAR | Status: AC
  Administered 2011-11-09: 4 mg via INTRAVENOUS
  Filled 2011-11-09: qty 2

## 2011-11-09 MED ORDER — SODIUM CHLORIDE 0.9 % IV SOLN
Freq: Once | INTRAVENOUS | Status: AC
  Administered 2011-11-09: 01:00:00 via INTRAVENOUS

## 2011-11-09 MED ORDER — IOHEXOL 300 MG/ML  SOLN
100.0000 mL | Freq: Once | INTRAMUSCULAR | Status: AC | PRN
  Administered 2011-11-09: 100 mL via INTRAVENOUS

## 2011-11-09 MED ORDER — OXYCODONE-ACETAMINOPHEN 5-325 MG PO TABS
1.0000 | ORAL_TABLET | Freq: Four times a day (QID) | ORAL | Status: AC | PRN
Start: 2011-11-09 — End: 2011-11-19

## 2011-11-09 NOTE — Discharge Instructions (Signed)

## 2011-11-09 NOTE — ED Provider Notes (Signed)
History     CSN: [id number]  Arrival date & time 11/08/11  2211   First MD Initiated Contact with Patient 11/09/11 0000      Chief Complaint  Patient presents with  . Abdominal Pain  . Edema    (Consider location/radiation/quality/duration/timing/severity/associated sxs/prior treatment) HPI Comments: Patient has history of cirrhosis due to alcohol (quit).  Also has esophageal varices.  Presents tonight with abd pain, having difficulty eating, vomiting.    Patient is a 51 y.o. male presenting with abdominal pain. The history is provided by the patient.  Abdominal Pain The primary symptoms of the illness include abdominal pain, fatigue and shortness of breath. The primary symptoms of the illness do not include fever, nausea, vomiting or diarrhea. The current episode started yesterday. The problem has been rapidly worsening.  Symptoms associated with the illness do not include chills or constipation.    Past Medical History  Diagnosis Date  . Hepatitis B   . Hepatic cirrhosis   . Hypertension     Past Surgical History  Procedure Date  . Appendectomy   . Eye surgery     No family history on file.  History  Substance Use Topics  . Smoking status: Current Everyday Smoker -- 1.0 packs/day    Types: Cigarettes  . Smokeless tobacco: Not on file  . Alcohol Use: No      Review of Systems  Constitutional: Positive for activity change, appetite change, fatigue and unexpected weight change. Negative for fever and chills.  HENT: Negative for neck pain.   Respiratory: Positive for shortness of breath.   Cardiovascular: Positive for leg swelling. Negative for chest pain.       Distension  Gastrointestinal: Positive for abdominal pain. Negative for nausea, vomiting, diarrhea and constipation.  All other systems reviewed and are negative.    Allergies  Review of patient's allergies indicates no known allergies.  Home Medications  No current outpatient prescriptions on  file.  BP 146/77  Pulse 93  Temp(Src) 98.8 F (37.1 C) (Oral)  Resp 20  Wt 260 lb (117.935 kg)  SpO2 98%  Physical Exam  Nursing note and vitals reviewed. Constitutional: He is oriented to person, place, and time. He appears well-developed and well-nourished. No distress.  HENT:  Head: Normocephalic and atraumatic.  Neck: Normal range of motion. Neck supple.  Cardiovascular: Normal rate and regular rhythm.   Pulmonary/Chest: Effort normal and breath sounds normal.  Abdominal:       Firm abdomen with distension, ttp in the right upper quadrant.  No rebound or guarding.  Positive fluid wave.  Musculoskeletal: Normal range of motion. He exhibits edema.       Trace ble edema.  Neurological: He is alert and oriented to person, place, and time.  Skin: Skin is warm and dry. He is not diaphoretic.    ED Course  Procedures (including critical care time)   Labs Reviewed  CBC  DIFFERENTIAL  BASIC METABOLIC PANEL  HEPATIC FUNCTION PANEL  LIPASE, BLOOD  URINALYSIS, ROUTINE W REFLEX MICROSCOPIC  APTT  PROTIME-INR   No results found.   No diagnosis found.    MDM  The workup is unremarkable.  The labs and ct scan show no surgical emergencies.  He feels swollen and does have some slight ble edema.  I will prescribe a low dose of lasix, percocet.  He also has several boils under the left axilla which I will treat with warm soaks and bactrim.  Geoffery Lyons, MD 11/09/11 [phone]

## 2011-12-06 ENCOUNTER — Encounter (HOSPITAL_COMMUNITY): Payer: Self-pay | Admitting: Emergency Medicine

## 2011-12-06 ENCOUNTER — Emergency Department (HOSPITAL_COMMUNITY)
Admission: EM | Admit: 2011-12-06 | Discharge: 2011-12-07 | Disposition: A | Discharge status: Home / Self Care | Payer: Medicaid Other | Attending: Emergency Medicine | Admitting: Emergency Medicine

## 2011-12-06 DIAGNOSIS — F1020 Alcohol dependence, uncomplicated: Secondary | ICD-10-CM | POA: Insufficient documentation

## 2011-12-06 DIAGNOSIS — F17200 Nicotine dependence, unspecified, uncomplicated: Secondary | ICD-10-CM | POA: Insufficient documentation

## 2011-12-06 DIAGNOSIS — I10 Essential (primary) hypertension: Secondary | ICD-10-CM | POA: Insufficient documentation

## 2011-12-06 DIAGNOSIS — B1920 Unspecified viral hepatitis C without hepatic coma: Secondary | ICD-10-CM | POA: Insufficient documentation

## 2011-12-06 DIAGNOSIS — M25519 Pain in unspecified shoulder: Secondary | ICD-10-CM | POA: Insufficient documentation

## 2011-12-06 DIAGNOSIS — K7030 Alcoholic cirrhosis of liver without ascites: Secondary | ICD-10-CM | POA: Insufficient documentation

## 2011-12-06 DIAGNOSIS — Z8619 Personal history of other infectious and parasitic diseases: Secondary | ICD-10-CM | POA: Insufficient documentation

## 2011-12-06 DIAGNOSIS — R109 Unspecified abdominal pain: Secondary | ICD-10-CM | POA: Insufficient documentation

## 2011-12-06 HISTORY — DX: Unspecified viral hepatitis C without hepatic coma: B19.20

## 2011-12-06 LAB — COMPREHENSIVE METABOLIC PANEL
Albumin: 4 g/dL (ref 3.5–5.2)
BUN: 16 mg/dL (ref 6–23)
Calcium: 9.7 mg/dL (ref 8.4–10.5)
Creatinine, Ser: 0.97 mg/dL (ref 0.50–1.35)
Total Bilirubin: 0.3 mg/dL (ref 0.3–1.2)
Total Protein: 8.4 g/dL — ABNORMAL HIGH (ref 6.0–8.3)

## 2011-12-06 LAB — CBC
HCT: 46.6 % (ref 39.0–52.0)
RBC: 5.16 MIL/uL (ref 4.22–5.81)
RDW: 13.9 % (ref 11.5–15.5)
WBC: 4.5 10*3/uL (ref 4.0–10.5)

## 2011-12-06 MED ORDER — FENTANYL CITRATE 0.05 MG/ML IJ SOLN
50.0000 ug | Freq: Once | INTRAMUSCULAR | Status: AC
Start: 2011-12-06 — End: 2011-12-06
  Administered 2011-12-06: 50 ug via INTRAVENOUS
  Filled 2011-12-06: qty 2

## 2011-12-06 MED ORDER — SODIUM CHLORIDE 0.9 % IV SOLN
INTRAVENOUS | Status: DC
  Administered 2011-12-06: via INTRAVENOUS

## 2011-12-06 MED ORDER — ONDANSETRON HCL 4 MG/2ML IJ SOLN
4.0000 mg | Freq: Once | INTRAMUSCULAR | Status: AC
  Administered 2011-12-06: 4 mg via INTRAVENOUS
  Filled 2011-12-06: qty 2

## 2011-12-06 NOTE — ED Provider Notes (Signed)
History     CSN: [id number]  Arrival date & time 12/06/11  2139   First MD Initiated Contact with Patient 12/06/11 2301      Chief Complaint  Patient presents with  . Abdominal Pain    (Consider location/radiation/quality/duration/timing/severity/associated sxs/prior treatment) HPI This is a 51 year old white male with a long-standing history of alcoholic cirrhosis complicated by hepatitis B and hepatitis C infection. He has been out of his regular medications for the past month to insurance problems. He is here with right flank pain, nausea and vomiting but started this morning. He has not been able to keep anything on his stomach. The pain is moderate to severe. It is worse with movement or palpation. He has not had diarrhea and in fact his stools have been harder than usual. He also complains of increasing distention of his abdomen over the past 6 months. This has caused him to be short of breath particularly with exertion. He is also complaining of pain in his right shoulder upper arm when he moves his arm. The arm had multiple surgeries on it for a bacterial infection about a year ago.  Past Medical History  Diagnosis Date  . Hepatitis B   . Hepatic cirrhosis   . Hypertension   . Hepatitis C     Past Surgical History  Procedure Date  . Appendectomy   . Eye surgery     No family history on file.  History  Substance Use Topics  . Smoking status: Current Everyday Smoker -- 1.0 packs/day    Types: Cigarettes  . Smokeless tobacco: Not on file  . Alcohol Use: No      Review of Systems  All other systems reviewed and are negative.    Allergies  Morphine and related and Tylenol  Home Medications   Current Outpatient Rx  Name Route Sig Dispense Refill  . AMITRIPTYLINE HCL 25 MG PO TABS Oral Take 25 mg by mouth at bedtime.    . FLUOXETINE HCL 40 MG PO CAPS Oral Take 40 mg by mouth daily.    . FUROSEMIDE 20 MG PO TABS Oral Take 1 tablet (20 mg total) by mouth 2  (two) times daily. 15 tablet 0  . LACTULOSE 10 GM/15ML PO SOLN Oral Take 20 g by mouth 2 (two) times daily.    Marland Kitchen OMEPRAZOLE 40 MG PO CPDR Oral Take 40 mg by mouth daily.    . OXYCODONE HCL 5 MG PO TABS Oral Take 5 mg by mouth every 4 (four) hours as needed. For pain.    Marland Kitchen ROPINIROLE HCL 2 MG PO TABS Oral Take 2 mg by mouth at bedtime.    . SPIRONOLACTONE 50 MG PO TABS Oral Take 50 mg by mouth daily.    . VENLAFAXINE HCL 75 MG PO TABS Oral Take 75 mg by mouth 2 (two) times daily.      BP 132/78  Pulse 67  Temp(Src) 97.8 F (36.6 C) (Oral)  Resp 20  SpO2 97%  Physical Exam General: Well-developed, well-nourished male in no acute distress; appearance consistent with age of record HENT: normocephalic, atraumatic Eyes: Left pupil round reactive to light; right pupil absent with blindness of the right eye; extraocular muscles intact Neck: supple Heart: regular rate and rhythm Lungs: Very faint expiratory wheezes Abdomen: soft; obese; no fluid wave palpated; mild to moderate right upper quadrant tenderness with hepatomegaly; bowel sounds present GU: Right CVA tenderness Extremities: No deformity; full range of motion; pulses normal Neurologic: Awake, alert and  oriented; motor function intact in all extremities and symmetric; no facial droop Skin: Warm and dry; no jaundice    ED Course  Procedures (including critical care time)     MDM   Nursing notes and vitals signs, including pulse oximetry, reviewed.  Summary of this visit's results, reviewed by myself:  Labs:  Results for orders placed during the hospital encounter of 12/06/11  COMPREHENSIVE METABOLIC PANEL      Component Value Range   Sodium 138  135 - 145 (mEq/L)   Potassium 4.1  3.5 - 5.1 (mEq/L)   Chloride 104  96 - 112 (mEq/L)   CO2 26  19 - 32 (mEq/L)   Glucose, Bld 92  70 - 99 (mg/dL)   BUN 16  6 - 23 (mg/dL)   Creatinine, Ser 9.60  0.50 - 1.35 (mg/dL)   Calcium 9.7  8.4 - 45.4 (mg/dL)   Total Protein 8.4  (*) 6.0 - 8.3 (g/dL)   Albumin 4.0  3.5 - 5.2 (g/dL)   AST 28  0 - 37 (U/L)   ALT 24  0 - 53 (U/L)   Alkaline Phosphatase 77  39 - 117 (U/L)   Total Bilirubin 0.3  0.3 - 1.2 (mg/dL)   GFR calc non Af Amer >90  >90 (mL/min)   GFR calc Af Amer >90  >90 (mL/min)  URINALYSIS, ROUTINE W REFLEX MICROSCOPIC      Component Value Range   Color, Urine YELLOW  YELLOW    APPearance CLEAR  CLEAR    Specific Gravity, Urine 1.019  1.005 - 1.030    pH 6.0  5.0 - 8.0    Glucose, UA NEGATIVE  NEGATIVE (mg/dL)   Hgb urine dipstick NEGATIVE  NEGATIVE    Bilirubin Urine NEGATIVE  NEGATIVE    Ketones, ur NEGATIVE  NEGATIVE (mg/dL)   Protein, ur NEGATIVE  NEGATIVE (mg/dL)   Urobilinogen, UA 0.2  0.0 - 1.0 (mg/dL)   Nitrite NEGATIVE  NEGATIVE    Leukocytes, UA NEGATIVE  NEGATIVE   CBC      Component Value Range   WBC 4.5  4.0 - 10.5 (K/uL)   RBC 5.16  4.22 - 5.81 (MIL/uL)   Hemoglobin 16.2  13.0 - 17.0 (g/dL)   HCT 09.8  11.9 - 14.7 (%)   MCV 90.3  78.0 - 100.0 (fL)   MCH 31.4  26.0 - 34.0 (pg)   MCHC 34.8  30.0 - 36.0 (g/dL)   RDW 82.9  56.2 - 13.0 (%)   Platelets 92 (*) 150 - 400 (K/uL)  DIFFERENTIAL      Component Value Range   Neutrophils Relative 46  43 - 77 (%)   Lymphocytes Relative 38  12 - 46 (%)   Monocytes Relative 12  3 - 12 (%)   Eosinophils Relative 4  0 - 5 (%)   Basophils Relative 0  0 - 1 (%)   Neutro Abs 2.1  1.7 - 7.7 (K/uL)   Lymphs Abs 1.7  0.7 - 4.0 (K/uL)   Monocytes Absolute 0.5  0.1 - 1.0 (K/uL)   Eosinophils Absolute 0.2  0.0 - 0.7 (K/uL)   Basophils Absolute 0.0  0.0 - 0.1 (K/uL)   Smear Review PLATELET COUNT CONFIRMED BY SMEAR    AMMONIA      Component Value Range   Ammonia 48  11 - 60 (umol/L)    Imaging Studies: Ct Abdomen Pelvis W Contrast  12/07/2011  *RADIOLOGY REPORT*  Clinical Data: Right-sided abdominal pain  CT ABDOMEN AND PELVIS WITH CONTRAST  Technique:  Multidetector CT imaging of the abdomen and pelvis was performed following the standard protocol  during bolus administration of intravenous contrast.  Contrast: OMNIPAQUE IOHEXOL 300 MG/ML IJ SOLN  Comparison: 11/09/2011  Findings: Limited images through the lung bases demonstrate no significant appreciable abnormality. The heart size is within normal limits. No pleural or pericardial effusion. Small para esophageal varices.  Cirrhotic liver morphology.  Cannot exclude hepatic cellular carcinoma given the timing of contrast bolus however no lesion is identified.  Splenomegaly.  Unremarkable pancreas and biliary system.  Unremarkable adrenal glands.  Nonspecific perinephric fat stranding is similar to prior.  Too small to further characterize hypodensities within each kidney.  No hydronephrosis or hydroureter.  No bowel obstruction.  No CT evidence for colitis.  Appendix not confidently identified however no right lower quadrant inflammation.  Thin-walled bladder. Small right hydrocele is partially imaged.  No lymphadenopathy.  There is scattered atherosclerotic calcification of the aorta and its branches. No aneurysmal dilatation.  No acute osseous abnormality identified.  IMPRESSION: Cirrhotic liver morphology.  Splenomegaly. Small paraesophageal varices.  Too small further characterize hypodensity within each kidney.  No acute process identified.  Original Report Authenticated By: Waneta Martins, M.D.   5:06 AM Patient advised of CT findings. He states that he usually takes oxycodone for pain but is currently out. He requests a refill.          Hanley Seamen, MD 12/07/11 [phone]

## 2011-12-06 NOTE — ED Notes (Signed)
Pt c/o n/v for several days, worse now. Pt also c/o R flank pain. States he has been having sharp pains to his R arm. Pt BIB EMS. States he last vomited in ambulance.

## 2011-12-06 NOTE — ED Notes (Signed)
Patient brought in from home by EMS with c/o progressive RUQ ABD pain that became worse this AM accompanied with N/V/SOB/DOE. Hx of cirrhosis.

## 2011-12-07 ENCOUNTER — Emergency Department (HOSPITAL_COMMUNITY): Payer: Medicaid Other

## 2011-12-07 LAB — DIFFERENTIAL
Basophils Absolute: 0 10*3/uL (ref 0.0–0.1)
Lymphocytes Relative: 38 % (ref 12–46)
Monocytes Relative: 12 % (ref 3–12)
Neutro Abs: 2.1 10*3/uL (ref 1.7–7.7)

## 2011-12-07 LAB — URINALYSIS, ROUTINE W REFLEX MICROSCOPIC
Glucose, UA: NEGATIVE mg/dL
Leukocytes, UA: NEGATIVE
Protein, ur: NEGATIVE mg/dL
Specific Gravity, Urine: 1.019 (ref 1.005–1.030)
pH: 6 (ref 5.0–8.0)

## 2011-12-07 MED ORDER — LIDOCAINE HCL 2 % IJ SOLN
INTRAMUSCULAR | Status: AC
  Filled 2011-12-07: qty 1

## 2011-12-07 MED ORDER — HYDROMORPHONE HCL PF 1 MG/ML IJ SOLN
1.0000 mg | Freq: Once | INTRAMUSCULAR | Status: AC
  Administered 2011-12-07: 1 mg via INTRAVENOUS
  Filled 2011-12-07: qty 1

## 2011-12-07 MED ORDER — IOHEXOL 300 MG/ML  SOLN
100.0000 mL | Freq: Once | INTRAMUSCULAR | Status: AC | PRN
  Administered 2011-12-07: 100 mL via INTRAVENOUS

## 2011-12-07 MED ORDER — OXYCODONE HCL 5 MG PO TABS: 5.0000 mg | ORAL_TABLET | ORAL | Status: DC | PRN

## 2011-12-07 MED ORDER — ONDANSETRON HCL 4 MG/2ML IJ SOLN
4.0000 mg | Freq: Once | INTRAMUSCULAR | Status: AC
  Administered 2011-12-07: 4 mg via INTRAVENOUS
  Filled 2011-12-07: qty 2

## 2011-12-07 MED ORDER — DIPHENHYDRAMINE HCL 50 MG/ML IJ SOLN
25.0000 mg | Freq: Once | INTRAMUSCULAR | Status: AC
  Administered 2011-12-07: 25 mg via INTRAVENOUS
  Filled 2011-12-07: qty 1

## 2011-12-07 MED ORDER — PROMETHAZINE HCL 25 MG PO TABS: 25.0000 mg | ORAL_TABLET | Freq: Four times a day (QID) | ORAL | Status: DC | PRN

## 2011-12-07 MED ORDER — NAPROXEN 500 MG PO TABS
500.0000 mg | ORAL_TABLET | Freq: Once | ORAL | Status: AC
  Administered 2011-12-07: 500 mg via ORAL
  Filled 2011-12-07: qty 1

## 2011-12-07 NOTE — ED Notes (Signed)
Patient reports abdominal pain decreased, but now has "bad headache". Dr. Read Drivers notified; orders received.

## 2011-12-07 NOTE — ED Notes (Signed)
Patient transported to CT 

## 2012-01-17 ENCOUNTER — Encounter (HOSPITAL_COMMUNITY): Payer: Self-pay | Admitting: *Deleted

## 2012-01-17 ENCOUNTER — Emergency Department (HOSPITAL_COMMUNITY)
Admission: EM | Admit: 2012-01-17 | Discharge: 2012-01-17 | Disposition: A | Discharge status: Home / Self Care | Payer: Medicaid Other | Attending: Emergency Medicine | Admitting: Emergency Medicine

## 2012-01-17 DIAGNOSIS — L03211 Cellulitis of face: Secondary | ICD-10-CM | POA: Insufficient documentation

## 2012-01-17 DIAGNOSIS — Z79899 Other long term (current) drug therapy: Secondary | ICD-10-CM | POA: Insufficient documentation

## 2012-01-17 DIAGNOSIS — L0201 Cutaneous abscess of face: Secondary | ICD-10-CM | POA: Insufficient documentation

## 2012-01-17 DIAGNOSIS — I10 Essential (primary) hypertension: Secondary | ICD-10-CM | POA: Insufficient documentation

## 2012-01-17 MED ORDER — CEPHALEXIN 500 MG PO CAPS
500.0000 mg | ORAL_CAPSULE | Freq: Once | ORAL | Status: AC
  Administered 2012-01-17: 500 mg via ORAL
  Filled 2012-01-17: qty 1

## 2012-01-17 MED ORDER — SULFAMETHOXAZOLE-TRIMETHOPRIM 800-160 MG PO TABS: 1.0000 | ORAL_TABLET | Freq: Three times a day (TID) | ORAL | Status: AC

## 2012-01-17 MED ORDER — OXYCODONE HCL 5 MG PO TABS: 5.0000 mg | ORAL_TABLET | ORAL | Status: DC | PRN

## 2012-01-17 MED ORDER — CEPHALEXIN 500 MG PO CAPS: 500.0000 mg | ORAL_CAPSULE | Freq: Four times a day (QID) | ORAL | Status: AC

## 2012-01-17 MED ORDER — SULFAMETHOXAZOLE-TMP DS 800-160 MG PO TABS
1.0000 | ORAL_TABLET | Freq: Once | ORAL | Status: AC
  Administered 2012-01-17: 1 via ORAL
  Filled 2012-01-17: qty 1

## 2012-01-17 MED ORDER — LIDOCAINE-EPINEPHRINE 2 %-1:100000 IJ SOLN
1.7000 mL | Freq: Once | INTRAMUSCULAR | Status: AC
  Administered 2012-01-17: 1.7 mL

## 2012-01-17 MED ORDER — OXYCODONE HCL 5 MG PO TABS
5.0000 mg | ORAL_TABLET | Freq: Once | ORAL | Status: AC
  Administered 2012-01-17: 5 mg via ORAL
  Filled 2012-01-17: qty 1

## 2012-01-17 NOTE — ED Notes (Signed)
Pt states he has an abscess on his right eye. 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 Pt states they keep coming

## 2012-01-17 NOTE — Discharge Instructions (Signed)

## 2012-01-17 NOTE — ED Provider Notes (Signed)
History     CSN: [id number]  Arrival date & time 01/17/12  0944   First MD Initiated Contact with Patient 01/17/12 [phone]      Chief Complaint  Patient presents with  . Abscess    (Consider location/radiation/quality/duration/timing/severity/associated sxs/prior treatment) Patient is a 51 y.o. male presenting with abscess. The history is provided by the patient.  Abscess  This is a recurrent problem. Pertinent negatives include no fever.   patient states he has an abscess above his right eye. His previous abscesses in other parts of his body. His history of MRSA. This one began a few days ago. He has history of cirrhosis and hepatitis B and C. No fevers. No trauma. He has not had an abscess in the site previously. No eye pain. No trouble seeing.  Past Medical History  Diagnosis Date  . Hepatitis B   . Hepatic cirrhosis   . Hypertension   . Hepatitis C     Past Surgical History  Procedure Date  . Appendectomy   . Eye surgery     No family history on file.  History  Substance Use Topics  . Smoking status: Current Everyday Smoker -- 1.0 packs/day    Types: Cigarettes  . Smokeless tobacco: Not on file  . Alcohol Use: No      Review of Systems  Constitutional: Negative for fever and chills.  Eyes: Positive for pain. Negative for redness.  Skin: Positive for wound.    Allergies  Morphine and related and Tylenol  Home Medications   Current Outpatient Rx  Name Route Sig Dispense Refill  . AMITRIPTYLINE HCL 25 MG PO TABS Oral Take 25 mg by mouth at bedtime.    . FLUOXETINE HCL 40 MG PO CAPS Oral Take 40 mg by mouth daily.    . FUROSEMIDE 20 MG PO TABS Oral Take 1 tablet (20 mg total) by mouth 2 (two) times daily. 15 tablet 0  . LACTULOSE 10 GM/15ML PO SOLN Oral Take 20 g by mouth 2 (two) times daily.    Marland Kitchen OMEPRAZOLE 40 MG PO CPDR Oral Take 40 mg by mouth daily.    . OXYCODONE HCL 5 MG PO TABS Oral Take 1 tablet (5 mg total) by mouth every 4 (four) hours as needed.  For pain. 30 tablet 0  . ROPINIROLE HCL 2 MG PO TABS Oral Take 2 mg by mouth at bedtime.    . SPIRONOLACTONE 50 MG PO TABS Oral Take 50 mg by mouth daily.    . VENLAFAXINE HCL 75 MG PO TABS Oral Take 75 mg by mouth 2 (two) times daily.    . CEPHALEXIN 500 MG PO CAPS Oral Take 1 capsule (500 mg total) by mouth 4 (four) times daily. 20 capsule 0  . OXYCODONE HCL 5 MG PO TABS Oral Take 1 tablet (5 mg total) by mouth every 4 (four) hours as needed for pain. 6 tablet 0  . SULFAMETHOXAZOLE-TRIMETHOPRIM 800-160 MG PO TABS Oral Take 1 tablet by mouth 3 (three) times daily. 15 tablet 0    BP 159/109  Pulse 82  Temp(Src) 97.6 F (36.4 C) (Oral)  Resp 22  Ht 6\' 4"  (1.93 m)  Wt 250 lb (113.399 kg)  BMI 30.43 kg/m2  SpO2 100%  Physical Exam  Constitutional: He appears well-developed.  Eyes: EOM are normal. Pupils are equal, round, and reactive to light.  Skin:       Approximately 1.5 cm tender indurated area to right eyebrow. There is a central  scab. The mass is mobile.    ED Course  INCISION AND DRAINAGE Date/Time: 01/17/2012 11:30 AM Performed by: Benjiman Core R. Authorized by: Billee Cashing Consent: Verbal consent obtained. Risks and benefits: risks, benefits and alternatives were discussed Consent given by: patient Patient understanding: patient does not state understanding of the procedure being performed Patient consent: the patient's understanding of the procedure does not match consent given Procedure consent: procedure consent does not match procedure scheduled Relevant documents: relevant documents not present or verified Test results: test results not available Site marked: the operative site was not marked Imaging studies: imaging studies not available Required items: required blood products, implants, devices, and special equipment available Patient identity confirmed: verbally with patient and arm band Time out: Immediately prior to procedure a "time out" was  called to verify the correct patient, procedure, equipment, support staff and site/side marked as required. Type: abscess Body area: head/neck Location details: face Anesthesia: local infiltration Local anesthetic: lidocaine 2% with epinephrine Anesthetic total: 1 ml Risk factor: underlying major nerve Scalpel size: 11 Incision type: single straight Complexity: simple Drainage: purulent Drainage amount: moderate Wound treatment: wound left open Packing material: none Patient tolerance: Patient tolerated the procedure well with no immediate complications.   (including critical care time)  Labs Reviewed - No data to display No results found.   1. Facial abscess       MDM  Abscess to right face. No apparent ocular involvement. Wound is opened was some drainage. Antibiotics were also given. He'll followup in a couple days for reevaluation        Harrold Donath R. Rubin Payor, MD 01/17/12 [phone]

## 2012-05-06 ENCOUNTER — Ambulatory Visit
Admission: RE | Admit: 2012-05-06 | Discharge: 2012-05-06 | Disposition: A | Discharge status: Home / Self Care | Payer: Medicaid Other | Source: Ambulatory Visit | Attending: Nephrology | Admitting: Nephrology

## 2012-05-06 ENCOUNTER — Other Ambulatory Visit: Payer: Self-pay | Admitting: Nephrology

## 2012-05-06 DIAGNOSIS — R05 Cough: Secondary | ICD-10-CM

## 2012-06-09 ENCOUNTER — Encounter (HOSPITAL_COMMUNITY): Payer: Self-pay | Admitting: *Deleted

## 2012-06-09 ENCOUNTER — Emergency Department (HOSPITAL_COMMUNITY)
Admission: EM | Admit: 2012-06-09 | Discharge: 2012-06-09 | Disposition: A | Discharge status: Home / Self Care | Payer: Medicaid Other | Attending: Emergency Medicine | Admitting: Emergency Medicine

## 2012-06-09 DIAGNOSIS — R109 Unspecified abdominal pain: Secondary | ICD-10-CM

## 2012-06-09 DIAGNOSIS — B1920 Unspecified viral hepatitis C without hepatic coma: Secondary | ICD-10-CM | POA: Insufficient documentation

## 2012-06-09 DIAGNOSIS — B1910 Unspecified viral hepatitis B without hepatic coma: Secondary | ICD-10-CM | POA: Insufficient documentation

## 2012-06-09 DIAGNOSIS — F17200 Nicotine dependence, unspecified, uncomplicated: Secondary | ICD-10-CM | POA: Insufficient documentation

## 2012-06-09 DIAGNOSIS — R197 Diarrhea, unspecified: Secondary | ICD-10-CM | POA: Insufficient documentation

## 2012-06-09 DIAGNOSIS — F1410 Cocaine abuse, uncomplicated: Secondary | ICD-10-CM | POA: Insufficient documentation

## 2012-06-09 DIAGNOSIS — Z79899 Other long term (current) drug therapy: Secondary | ICD-10-CM | POA: Insufficient documentation

## 2012-06-09 DIAGNOSIS — I10 Essential (primary) hypertension: Secondary | ICD-10-CM | POA: Insufficient documentation

## 2012-06-09 DIAGNOSIS — R1011 Right upper quadrant pain: Secondary | ICD-10-CM | POA: Insufficient documentation

## 2012-06-09 LAB — URINALYSIS, ROUTINE W REFLEX MICROSCOPIC
Hgb urine dipstick: NEGATIVE
Nitrite: NEGATIVE
Urobilinogen, UA: 4 mg/dL — ABNORMAL HIGH (ref 0.0–1.0)

## 2012-06-09 LAB — COMPREHENSIVE METABOLIC PANEL
ALT: 83 U/L — ABNORMAL HIGH (ref 0–53)
AST: 78 U/L — ABNORMAL HIGH (ref 0–37)
Albumin: 3.5 g/dL (ref 3.5–5.2)
Alkaline Phosphatase: 97 U/L (ref 39–117)
Chloride: 101 mEq/L (ref 96–112)
Potassium: 3.9 mEq/L (ref 3.5–5.1)
Total Bilirubin: 0.7 mg/dL (ref 0.3–1.2)

## 2012-06-09 LAB — URINALYSIS, MICROSCOPIC ONLY
Glucose, UA: NEGATIVE mg/dL
Ketones, ur: NEGATIVE mg/dL
pH: 6.5 (ref 5.0–8.0)

## 2012-06-09 LAB — CBC WITH DIFFERENTIAL/PLATELET
Basophils Absolute: 0 10*3/uL (ref 0.0–0.1)
Basophils Relative: 1 % (ref 0–1)
Eosinophils Absolute: 0.1 10*3/uL (ref 0.0–0.7)
HCT: 44.4 % (ref 39.0–52.0)
Hemoglobin: 15.5 g/dL (ref 13.0–17.0)
Lymphocytes Relative: 27 % (ref 12–46)
MCH: 31.3 pg (ref 26.0–34.0)
MCHC: 34.9 g/dL (ref 30.0–36.0)
Monocytes Absolute: 0.6 10*3/uL (ref 0.1–1.0)
Neutro Abs: 2 10*3/uL (ref 1.7–7.7)
Neutrophils Relative %: 52 % (ref 43–77)
Platelets: 60 10*3/uL — ABNORMAL LOW (ref 150–400)
RBC: 5.05 MIL/uL (ref 4.22–5.81)
RDW: 13.6 % (ref 11.5–15.5)
WBC: 3.7 10*3/uL — ABNORMAL LOW (ref 4.0–10.5)

## 2012-06-09 LAB — RAPID URINE DRUG SCREEN, HOSP PERFORMED
Barbiturates: NOT DETECTED
Cocaine: POSITIVE — AB
Tetrahydrocannabinol: NOT DETECTED

## 2012-06-09 LAB — URINE MICROSCOPIC-ADD ON

## 2012-06-09 MED ORDER — FENTANYL CITRATE 0.05 MG/ML IJ SOLN
50.0000 ug | Freq: Once | INTRAMUSCULAR | Status: AC
  Administered 2012-06-09: 50 ug via INTRAVENOUS
  Filled 2012-06-09: qty 2

## 2012-06-09 MED ORDER — HYDROCODONE-ACETAMINOPHEN 5-325 MG PO TABS: 1.0000 | ORAL_TABLET | ORAL | Status: DC | PRN

## 2012-06-09 MED ORDER — BACITRACIN ZINC 500 UNIT/GM EX OINT
TOPICAL_OINTMENT | CUTANEOUS | Status: AC
  Administered 2012-06-09: 16:00:00
  Filled 2012-06-09: qty 0.9

## 2012-06-09 MED ORDER — OXYCODONE HCL 5 MG PO CAPS: 5.0000 mg | ORAL_CAPSULE | ORAL | Status: DC | PRN

## 2012-06-09 MED ORDER — HYDROMORPHONE HCL PF 1 MG/ML IJ SOLN
1.0000 mg | Freq: Once | INTRAMUSCULAR | Status: AC
  Administered 2012-06-09: 1 mg via INTRAVENOUS
  Filled 2012-06-09: qty 1

## 2012-06-09 MED ORDER — ONDANSETRON HCL 4 MG/2ML IJ SOLN
4.0000 mg | Freq: Once | INTRAMUSCULAR | Status: AC
  Administered 2012-06-09: 4 mg via INTRAVENOUS
  Filled 2012-06-09: qty 2
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
## 2012-06-09 NOTE — ED Provider Notes (Signed)
History     CSN: [id number]  Arrival date & time 06/09/12  1017   First MD Initiated Contact with Patient 06/09/12 1226      Chief Complaint  Patient presents with  . Emesis  . Hand Burn    (Consider location/radiation/quality/duration/timing/severity/associated sxs/prior treatment) HPI Comments: Mark Christensen is a 51 y.o. Male with abdominal pain, chest pain, leg pain, nausea, vomiting, and diarrhea for one week. He has been out of his oxycodone for 6 months by report. He sees a primary care doctor, who is reportedly referring him for care at a hepatitis clinic, and a pain management clinic. He denies fever, chills, cough, back, pain, or dizziness.  Patient is a 51 y.o. male presenting with vomiting. The history is provided by the patient.  Emesis     Past Medical History  Diagnosis Date  . Hepatitis B   . Hepatic cirrhosis   . Hypertension   . Hepatitis C     Past Surgical History  Procedure Date  . Appendectomy   . Eye surgery     No family history on file.  History  Substance Use Topics  . Smoking status: Current Every Day Smoker -- 1.0 packs/day    Types: Cigarettes  . Smokeless tobacco: Not on file  . Alcohol Use: No      Review of Systems  Gastrointestinal: Positive for vomiting.  All other systems reviewed and are negative.    Allergies  Morphine and related and Tylenol  Home Medications   Current Outpatient Rx  Name Route Sig Dispense Refill  . AMITRIPTYLINE HCL 25 MG PO TABS Oral Take 25 mg by mouth at bedtime.    . FLUOXETINE HCL 40 MG PO CAPS Oral Take 40 mg by mouth daily.    . FUROSEMIDE 20 MG PO TABS Oral Take 1 tablet (20 mg total) by mouth 2 (two) times daily. 15 tablet 0  . LACTULOSE 10 GM/15ML PO SOLN Oral Take 20 g by mouth 2 (two) times daily.    Marland Kitchen OMEPRAZOLE 40 MG PO CPDR Oral Take 40 mg by mouth daily.    . OXYCODONE HCL 5 MG PO TABS Oral Take 1 tablet (5 mg total) by mouth every 4 (four) hours as needed. For pain. 30 tablet 0    . ROPINIROLE HCL 2 MG PO TABS Oral Take 2 mg by mouth at bedtime.    . SPIRONOLACTONE 50 MG PO TABS Oral Take 50 mg by mouth daily.    . VENLAFAXINE HCL 75 MG PO TABS Oral Take 75 mg by mouth 2 (two) times daily.    Marland Kitchen HYDROCODONE-ACETAMINOPHEN 5-325 MG PO TABS Oral Take 1 tablet by mouth every 4 (four) hours as needed for pain. 10 tablet 0  . OXYCODONE HCL 5 MG PO CAPS Oral Take 1 capsule (5 mg total) by mouth every 4 (four) hours as needed. 10 capsule 0    BP 133/97  Pulse 82  Temp 97.9 F (36.6 C) (Oral)  Resp 16  SpO2 97%  Physical Exam  Nursing note and vitals reviewed. Constitutional: He is oriented to person, place, and time. He appears well-developed and well-nourished.  HENT:  Head: Normocephalic and atraumatic.  Right Ear: External ear normal.  Left Ear: External ear normal.  Eyes: Conjunctivae normal and EOM are normal. Pupils are equal, round, and reactive to light.  Neck: Normal range of motion and phonation normal. Neck supple.  Cardiovascular: Normal rate, regular rhythm, normal heart sounds and intact distal pulses.  Pulmonary/Chest: Effort normal and breath sounds normal. He exhibits no bony tenderness.  Abdominal: Soft. Normal appearance. There is tenderness (Mild right upper quadrant).  Musculoskeletal: Normal range of motion. He exhibits no edema and no tenderness.  Neurological: He is alert and oriented to person, place, and time. He has normal strength. No cranial nerve deficit or sensory deficit. He exhibits normal muscle tone. Coordination normal.  Skin: Skin is warm, dry and intact.  Psychiatric: He has a normal mood and affect. His behavior is normal. Judgment and thought content normal.    ED Course  Procedures (including critical care time)  Labs Reviewed  CBC WITH DIFFERENTIAL - Abnormal; Notable for the following:    WBC 3.7 (*)  SPECIMEN CLOTTED   Platelets 60 (*)     All other components within normal limits  COMPREHENSIVE METABOLIC PANEL -  Abnormal; Notable for the following:    Sodium 134 (*)     AST 78 (*)     ALT 83 (*)     All other components within normal limits  URINALYSIS, WITH MICROSCOPIC - Abnormal; Notable for the following:    Color, Urine AMBER (*)  BIOCHEMICALS MAY BE AFFECTED BY COLOR   Bilirubin Urine SMALL (*)     Urobilinogen, UA 4.0 (*)     Leukocytes, UA TRACE (*)     Bacteria, UA FEW (*)     Squamous Epithelial / LPF FEW (*)     All other components within normal limits  URINALYSIS, ROUTINE W REFLEX MICROSCOPIC - Abnormal; Notable for the following:    Color, Urine AMBER (*)  BIOCHEMICALS MAY BE AFFECTED BY COLOR   Bilirubin Urine SMALL (*)     Ketones, ur TRACE (*)     Urobilinogen, UA 4.0 (*)     Leukocytes, UA TRACE (*)     All other components within normal limits  URINE RAPID DRUG SCREEN (HOSP PERFORMED) - Abnormal; Notable for the following:    Cocaine POSITIVE (*)     All other components within normal limits  URINE MICROSCOPIC-ADD ON - Abnormal; Notable for the following:    Squamous Epithelial / LPF FEW (*)     Bacteria, UA FEW (*)     All other components within normal limits  CBC WITH DIFFERENTIAL - Abnormal; Notable for the following:    WBC 3.7 (*)     Platelets 74 (*)     Monocytes Relative 17 (*)     All other components within normal limits  LIPASE, BLOOD   No results found.   1. Cocaine abuse   2. Abdominal pain of unknown etiology   3. Diarrhea       MDM  Nonspecific abdominal pain, with diarrhea. Doubt colitis, obstruction, ischemic bowel disease, bacterial infection. He stable for discharge.   Plan: Home Medications- Oxycodone 5 mg #10; Home Treatments- STOP cocaine; Recommended follow up- PCP of choice prn        Flint Melter, MD 06/09/12 1606

## 2012-06-09 NOTE — ED Notes (Signed)
Pt reports pain has come back. Will notify MD

## 2012-06-28 ENCOUNTER — Encounter (HOSPITAL_COMMUNITY): Payer: Self-pay | Admitting: *Deleted

## 2012-06-28 DIAGNOSIS — Z79899 Other long term (current) drug therapy: Secondary | ICD-10-CM | POA: Insufficient documentation

## 2012-06-28 DIAGNOSIS — IMO0002 Reserved for concepts with insufficient information to code with codable children: Secondary | ICD-10-CM | POA: Insufficient documentation

## 2012-06-28 DIAGNOSIS — I10 Essential (primary) hypertension: Secondary | ICD-10-CM | POA: Insufficient documentation

## 2012-06-28 DIAGNOSIS — Z9089 Acquired absence of other organs: Secondary | ICD-10-CM | POA: Insufficient documentation

## 2012-06-28 DIAGNOSIS — L02219 Cutaneous abscess of trunk, unspecified: Secondary | ICD-10-CM | POA: Insufficient documentation

## 2012-06-28 NOTE — ED Notes (Signed)
The pt is complaining of pain in his rt forearm and his rt lower abd.  He was seen and admitted at [facility] and had the abcesses in both those areas opened and drained.  He was diagnosed with mersa.  He is c/o pain and he thinks he needs further treatment

## 2012-06-29 ENCOUNTER — Emergency Department (HOSPITAL_COMMUNITY): Payer: Medicaid Other

## 2012-06-29 ENCOUNTER — Emergency Department (HOSPITAL_COMMUNITY)
Admission: EM | Admit: 2012-06-29 | Discharge: 2012-06-29 | Disposition: A | Discharge status: Home / Self Care | Payer: Medicaid Other | Attending: Orthopedic Surgery | Admitting: Orthopedic Surgery

## 2012-06-29 ENCOUNTER — Encounter (HOSPITAL_COMMUNITY): Payer: Self-pay | Admitting: Emergency Medicine

## 2012-06-29 DIAGNOSIS — L0390 Cellulitis, unspecified: Secondary | ICD-10-CM

## 2012-06-29 DIAGNOSIS — T148XXA Other injury of unspecified body region, initial encounter: Secondary | ICD-10-CM

## 2012-06-29 LAB — POCT I-STAT, CHEM 8
Chloride: 106 mEq/L (ref 96–112)
Creatinine, Ser: 1.1 mg/dL (ref 0.50–1.35)
HCT: 44 % (ref 39.0–52.0)
Hemoglobin: 15 g/dL (ref 13.0–17.0)
Potassium: 3.8 mEq/L (ref 3.5–5.1)
Sodium: 141 mEq/L (ref 135–145)

## 2012-06-29 LAB — CBC WITH DIFFERENTIAL/PLATELET
Basophils Absolute: 0 10*3/uL (ref 0.0–0.1)
Basophils Relative: 1 % (ref 0–1)
Eosinophils Absolute: 0.2 10*3/uL (ref 0.0–0.7)
Eosinophils Relative: 4 % (ref 0–5)
MCH: 31.8 pg (ref 26.0–34.0)
MCV: 88.6 fL (ref 78.0–100.0)
Neutrophils Relative %: 49 % (ref 43–77)
Platelets: 133 10*3/uL — ABNORMAL LOW (ref 150–400)
RDW: 13.9 % (ref 11.5–15.5)

## 2012-06-29 LAB — LACTIC ACID, PLASMA: Lactic Acid, Venous: 1.2 mmol/L (ref 0.5–2.2)

## 2012-06-29 LAB — URINALYSIS, ROUTINE W REFLEX MICROSCOPIC
Glucose, UA: NEGATIVE mg/dL
Ketones, ur: NEGATIVE mg/dL
Leukocytes, UA: NEGATIVE
Protein, ur: NEGATIVE mg/dL
Urobilinogen, UA: 1 mg/dL (ref 0.0–1.0)

## 2012-06-29 MED ORDER — OXYCODONE HCL 5 MG PO TABS: 5.0000 mg | ORAL_TABLET | ORAL | Status: DC | PRN

## 2012-06-29 MED ORDER — FENTANYL CITRATE 0.05 MG/ML IJ SOLN
100.0000 ug | Freq: Once | INTRAMUSCULAR | Status: AC
  Administered 2012-06-29: 100 ug via INTRAVENOUS
  Filled 2012-06-29: qty 2

## 2012-06-29 MED ORDER — ONDANSETRON HCL 4 MG/2ML IJ SOLN
4.0000 mg | Freq: Once | INTRAMUSCULAR | Status: AC
  Administered 2012-06-29: 4 mg via INTRAVENOUS
  Filled 2012-06-29: qty 2

## 2012-06-29 MED ORDER — FENTANYL CITRATE 0.05 MG/ML IJ SOLN
50.0000 ug | Freq: Once | INTRAMUSCULAR | Status: AC
  Administered 2012-06-29: 50 ug via INTRAVENOUS
  Filled 2012-06-29: qty 2

## 2012-06-29 MED ORDER — FENTANYL CITRATE 0.05 MG/ML IJ SOLN
50.0000 ug | Freq: Once | INTRAMUSCULAR | Status: DC
  Filled 2012-06-29: qty 2

## 2012-06-29 MED ORDER — VANCOMYCIN HCL IN DEXTROSE 1-5 GM/200ML-% IV SOLN
1000.0000 mg | Freq: Once | INTRAVENOUS | Status: AC
  Administered 2012-06-29: 1000 mg via INTRAVENOUS
  Filled 2012-06-29: qty 200

## 2012-06-29 MED ORDER — PIPERACILLIN-TAZOBACTAM 3.375 G IVPB 30 MIN
3.3750 g | Freq: Once | INTRAVENOUS | Status: AC
  Administered 2012-06-29: 3.375 g via INTRAVENOUS
  Filled 2012-06-29: qty 50

## 2012-06-29 MED ORDER — FENTANYL CITRATE 0.05 MG/ML IJ SOLN: 50.0000 ug | Freq: Once | INTRAMUSCULAR | Status: DC

## 2012-06-29 MED ORDER — HYDROMORPHONE HCL PF 1 MG/ML IJ SOLN
1.0000 mg | Freq: Once | INTRAMUSCULAR | Status: AC
  Administered 2012-06-29: 1 mg via INTRAVENOUS
  Filled 2012-06-29: qty 1

## 2012-06-29 MED ORDER — FENTANYL CITRATE 0.05 MG/ML IJ SOLN
100.0000 ug | Freq: Once | INTRAMUSCULAR | Status: AC
  Administered 2012-06-29: 100 ug via INTRAVENOUS

## 2012-06-29 NOTE — ED Provider Notes (Addendum)
History     CSN: [id number]  Arrival date & time 06/28/12  2148   First MD Initiated Contact with Patient 06/29/12 0102      Chief Complaint  Patient presents with  . Laveda Norman infection     (Consider location/radiation/quality/duration/timing/severity/associated sxs/prior treatment) Patient is a 51 y.o. male presenting with abscess. The history is provided by the patient.  Abscess  This is a new problem. The current episode started more than one week ago. The onset is undetermined. The problem occurs continuously. The problem has been gradually worsening. Affected Location: right volar foream and left lower abdomen. The problem is severe. The abscess is characterized by painfulness, draining and swelling. It is unknown what he was exposed to. Pertinent negatives include no anorexia and no fever. His past medical history does not include skin abscesses in family. Recently, medical care has been given at another facility (had OR I/D by Dr. Adolphus Birchwood 9 days ago). Services received include medications given.  Now worsening drainage since d/c.  Has not followed up with the surgeon.  Streaking up into the right antecubital fossa  Past Medical History  Diagnosis Date  . Hepatitis B   . Hepatic cirrhosis   . Hypertension   . Hepatitis C     Past Surgical History  Procedure Date  . Appendectomy   . Eye surgery     History reviewed. No pertinent family history.  History  Substance Use Topics  . Smoking status: Current Every Day Smoker -- 1.0 packs/day    Types: Cigarettes  . Smokeless tobacco: Not on file  . Alcohol Use: No      Review of Systems  Constitutional: Negative for fever.  Respiratory: Negative for shortness of breath.   Cardiovascular: Negative for chest pain.  Gastrointestinal: Negative for anorexia.  Skin: Positive for color change and wound.  All other systems reviewed and are negative.    Allergies  Morphine and related; Tramadol; and Tylenol  Home  Medications   Current Outpatient Rx  Name Route Sig Dispense Refill  . AMITRIPTYLINE HCL 25 MG PO TABS Oral Take 25 mg by mouth at bedtime.    . FLUOXETINE HCL 40 MG PO CAPS Oral Take 40 mg by mouth daily.    . FUROSEMIDE 20 MG PO TABS Oral Take 1 tablet (20 mg total) by mouth 2 (two) times daily. 15 tablet 0  . LACTULOSE 10 GM/15ML PO SOLN Oral Take 20 g by mouth 2 (two) times daily.    Marland Kitchen OMEPRAZOLE 40 MG PO CPDR Oral Take 40 mg by mouth daily.    . OXYCODONE HCL 5 MG PO TABS Oral Take 1 tablet (5 mg total) by mouth every 4 (four) hours as needed. For pain. 30 tablet 0  . ROPINIROLE HCL 2 MG PO TABS Oral Take 2 mg by mouth at bedtime.    . SPIRONOLACTONE 50 MG PO TABS Oral Take 50 mg by mouth daily.    . VENLAFAXINE HCL 75 MG PO TABS Oral Take 75 mg by mouth 2 (two) times daily.      BP 139/95  Pulse 92  Temp 97.9 F (36.6 C) (Oral)  Resp 24  SpO2 98%  Physical Exam  Constitutional: He is oriented to person, place, and time. He appears well-developed and well-nourished. No distress.  HENT:  Head: Normocephalic and atraumatic.  Mouth/Throat: Oropharynx is clear and moist.  Eyes: Conjunctivae normal are normal. Pupils are equal, round, and reactive to light.  Neck: Normal range of motion.  Neck supple.  Cardiovascular: Normal rate and regular rhythm.   Pulmonary/Chest: Effort normal and breath sounds normal. He has no wheezes. He has no rales.  Abdominal: Soft. Bowel sounds are normal. There is no tenderness. There is no rebound and no guarding.       1 inch LLQ open wound with scant surrounding redness  Musculoskeletal: Normal range of motion.       Arms: Neurological: He is alert and oriented to person, place, and time.  Skin: Skin is warm and dry. There is erythema.  Psychiatric: He has a normal mood and affect.    ED Course  Procedures (including critical care time)  Labs Reviewed  CBC WITH DIFFERENTIAL - Abnormal; Notable for the following:    Platelets 133 (*)      All other components within normal limits  POCT I-STAT, CHEM 8  CULTURE, BLOOD (ROUTINE X 2)  CULTURE, BLOOD (ROUTINE X 2)  LACTIC ACID, PLASMA  URINALYSIS, ROUTINE W REFLEX MICROSCOPIC  URINE CULTURE   Dg Forearm Right  06/29/2012  *RADIOLOGY REPORT*  Clinical Data:  wound infection.  Remote fracture.  RIGHT FOREARM - 2 VIEW  Comparison: 01/19/2011  Findings: There is a soft tissue defect in the anteromedial forearm.  No radiodense foreign body.  Old healed distal ulnar shaft fracture.  No acute bony abnormality.  IMPRESSION:  1.  Soft tissue defect without other acute abnormality.   Original Report Authenticated By: Osa Craver, M.D.      No diagnosis found.    MDM  Dr. Donnald Garre of [facility] on call for Dr. Adolphus Birchwood refused transfer of patient to [facility] stating he did not feel it was a post op complication    Case d/w Dr. Melvyn Novas. Npo will see in am for debridement, he will see the patient in the am    Pinkie Manger K Sanda Dejoy-Rasch, MD 06/29/12 1610  Adisyn Ruscitti K Lavergne Hiltunen-Rasch, MD 06/29/12 [id number]  Kang Ishida K Karthik Whittinghill-Rasch, MD 06/29/12 [phone]

## 2012-06-29 NOTE — ED Notes (Signed)
The patient advised he refuses to go back to [facility].  MD advised.

## 2012-06-29 NOTE — Progress Notes (Signed)
10:00 AM Pt was seen by Dr. Melvyn Novas, who advised that pt would need to do wet-to-dry dressings to his wounds twice a day, continue to take his antibiotics, and see him in his office on Tuesday, July 01, 2012, 2 days from now.

## 2012-06-29 NOTE — ED Notes (Signed)
Patient discharged home with instructions using the teach back method. Patient verbalizes an understanding.

## 2012-06-29 NOTE — Consult Note (Signed)
Full note dictated Right arm with open draining wound approx 6 cm No fluctuance does not need to be opened and debrided Wound is healing by seconday intention and drainage normal Needs wound care Will ask PT to see for hydrotherapy for arm and abdomen Wet to dry dressings twice a day Oral pain meds per ED Can f/u with me in office on Tuesday

## 2012-06-30 LAB — URINE CULTURE: Colony Count: 2000

## 2012-06-30 NOTE — Consult Note (Signed)
NAMEMarland Kitchen  NADIA, TORR NO.:  [id number]  MEDICAL RECORD NO.:  [id number]  LOCATION:  CD11C                        FACILITY:  [facility]  PHYSICIAN:  Madelynn Done, MD  DATE OF BIRTH:  April 08, 1961  DATE OF CONSULTATION:  06/29/2012 DATE OF DISCHARGE:  06/29/2012                                CONSULTATION   REQUESTING PHYSICIAN:  April Palumbo-Rasch, MD  REASON FOR CONSULTATION: Concern for right arm infection.  BRIEF HISTORY:  The patient's history is documented in the ER note.  The patient had incision and drainage of right forearm abscess and left abdominal abscess done at [facility] within the last 10 days.  The patient presented to the ER with pain and discomfort to the right arm. I was asked to see the right arm.  The emergency department urged to contact the staff at [facility] and they declined transfer.  That is documented in the chart.  Past medical history, past surgical history, medications, allergies, review of systems as documented in the ER chart.  PHYSICAL EXAMINATION:  On examination, he is a somewhat unhealthy- appearing gentleman.  Height and weight are listed in the computer. Good hand coordination in his right hand.  Normal mood.  He is alert and oriented to person, place, and time, in no acute distress.  On examination of right upper extremity, the patient does have __________ approximately 6 cm x 3 cm __________ proximal third region of the forearm.  He does have granulation tissue.  He does not have any gross purulence with fluctuance.  __________ abscess or worsening infection.  He has a previously healed incision over his antecubital region.  He was able to extend his thumb, extend his digits.  His fingertips are warm, well perfused.  Good capillary refill.  Wound on his abdomen is circular, approximately 2 x 3 cm open wound draining no fluctuance.  IMPRESSION:  Right forearm abscess, abdominal abscess status post incision and  drainage.  PLAN:  Today, the plan is __________ has 2 surgical wounds that do not appear to be infected and appeared to be __________ appearance of what surgical wounds that are left open __________ assistance with wound care, try to have the physical therapy see him today in the emergency department for hydrotherapy and wound care, open dry dressing twice a day.  He need to do this over the next 2 days __________ seen in my office for a one-time visit to make sure he is at the appropriate level of wound care.  He will need to follow up with the surgeons that made the incisions on him with appropriate postoperative care __________ initially directing him so that he has the plan __________ going into the ER with pain but __________ to administer pain medication or continually follow him for the wounds that were created by another surgeon.  This can be done and they can direct this care but then unfortunately with his health insurance, wound care is difficult thing to find in the [facility].  All questions were answered and encouraged today.  Again, I will see him for the 1 time visit to make sure that his care __________ in the right direction  __________ the incisions on his __________.     Madelynn Done, MD     FWO/MEDQ  D:  06/29/2012  T:  06/29/2012  Job:  [id number]

## 2012-07-05 LAB — CULTURE, BLOOD (ROUTINE X 2): Culture: NO GROWTH

## 2012-07-31 ENCOUNTER — Other Ambulatory Visit: Payer: Self-pay | Admitting: Gastroenterology

## 2012-07-31 DIAGNOSIS — R109 Unspecified abdominal pain: Secondary | ICD-10-CM

## 2012-08-04 ENCOUNTER — Encounter (HOSPITAL_COMMUNITY): Payer: Self-pay | Admitting: *Deleted

## 2012-08-05 ENCOUNTER — Other Ambulatory Visit: Payer: Self-pay | Admitting: Gastroenterology

## 2012-08-05 DIAGNOSIS — Z139 Encounter for screening, unspecified: Secondary | ICD-10-CM

## 2012-08-07 ENCOUNTER — Ambulatory Visit
Admission: RE | Admit: 2012-08-07 | Discharge: 2012-08-07 | Disposition: A | Discharge status: Home / Self Care | Payer: Medicaid Other | Source: Ambulatory Visit | Attending: Gastroenterology | Admitting: Gastroenterology

## 2012-08-07 DIAGNOSIS — Z139 Encounter for screening, unspecified: Secondary | ICD-10-CM

## 2012-08-07 DIAGNOSIS — R109 Unspecified abdominal pain: Secondary | ICD-10-CM

## 2012-08-07 MED ORDER — GADOBENATE DIMEGLUMINE 529 MG/ML IV SOLN
20.0000 mL | Freq: Once | INTRAVENOUS | Status: AC | PRN
  Administered 2012-08-07: 20 mL via INTRAVENOUS

## 2012-08-11 ENCOUNTER — Encounter (HOSPITAL_COMMUNITY): Payer: Self-pay | Admitting: *Deleted

## 2012-08-11 HISTORY — PX: ARM DEBRIDEMENT: SHX890

## 2012-08-11 NOTE — Progress Notes (Signed)
08-11-12 Instructed.Teach back method used. Spoke with spouse, voices under standing. W. Kennon Portela

## 2012-08-12 ENCOUNTER — Encounter (HOSPITAL_COMMUNITY): Payer: Self-pay | Admitting: Pharmacy Technician

## 2012-08-12 HISTORY — PX: MULTIPLE TOOTH EXTRACTIONS: SHX2053

## 2012-08-13 ENCOUNTER — Encounter (HOSPITAL_COMMUNITY)
Admission: RE | Disposition: A | Discharge status: Home / Self Care | Payer: Self-pay | Source: Ambulatory Visit | Attending: Gastroenterology

## 2012-08-13 ENCOUNTER — Ambulatory Visit (HOSPITAL_COMMUNITY)
Admission: RE | Admit: 2012-08-13 | Discharge: 2012-08-13 | Disposition: A | Discharge status: Home / Self Care | Payer: Medicaid Other | Source: Ambulatory Visit | Attending: Gastroenterology | Admitting: Gastroenterology

## 2012-08-13 ENCOUNTER — Encounter (HOSPITAL_COMMUNITY): Payer: Self-pay | Admitting: Anesthesiology

## 2012-08-13 ENCOUNTER — Encounter (HOSPITAL_COMMUNITY): Payer: Self-pay

## 2012-08-13 ENCOUNTER — Ambulatory Visit (HOSPITAL_COMMUNITY): Payer: Medicaid Other | Admitting: Anesthesiology

## 2012-08-13 DIAGNOSIS — Z1211 Encounter for screening for malignant neoplasm of colon: Secondary | ICD-10-CM | POA: Insufficient documentation

## 2012-08-13 DIAGNOSIS — R1011 Right upper quadrant pain: Secondary | ICD-10-CM | POA: Insufficient documentation

## 2012-08-13 DIAGNOSIS — I10 Essential (primary) hypertension: Secondary | ICD-10-CM | POA: Insufficient documentation

## 2012-08-13 DIAGNOSIS — D126 Benign neoplasm of colon, unspecified: Secondary | ICD-10-CM | POA: Insufficient documentation

## 2012-08-13 DIAGNOSIS — F17200 Nicotine dependence, unspecified, uncomplicated: Secondary | ICD-10-CM | POA: Insufficient documentation

## 2012-08-13 DIAGNOSIS — K620 Anal polyp: Secondary | ICD-10-CM | POA: Insufficient documentation

## 2012-08-13 DIAGNOSIS — B1920 Unspecified viral hepatitis C without hepatic coma: Secondary | ICD-10-CM | POA: Insufficient documentation

## 2012-08-13 DIAGNOSIS — K7460 Unspecified cirrhosis of liver: Secondary | ICD-10-CM | POA: Insufficient documentation

## 2012-08-13 DIAGNOSIS — B1910 Unspecified viral hepatitis B without hepatic coma: Secondary | ICD-10-CM | POA: Insufficient documentation

## 2012-08-13 DIAGNOSIS — K219 Gastro-esophageal reflux disease without esophagitis: Secondary | ICD-10-CM | POA: Insufficient documentation

## 2012-08-13 DIAGNOSIS — I8500 Esophageal varices without bleeding: Secondary | ICD-10-CM | POA: Insufficient documentation

## 2012-08-13 HISTORY — DX: Headache: R51

## 2012-08-13 HISTORY — DX: Adverse effect of unspecified anesthetic, initial encounter: T41.45XA

## 2012-08-13 HISTORY — DX: Anxiety disorder, unspecified: F41.9

## 2012-08-13 HISTORY — DX: Restless legs syndrome: G25.81

## 2012-08-13 HISTORY — DX: Shortness of breath: R06.02

## 2012-08-13 HISTORY — DX: Unspecified osteoarthritis, unspecified site: M19.90

## 2012-08-13 HISTORY — DX: Other complications of anesthesia, initial encounter: T88.59XA

## 2012-08-13 HISTORY — PX: COLONOSCOPY WITH PROPOFOL: SHX5780

## 2012-08-13 HISTORY — DX: Gastro-esophageal reflux disease without esophagitis: K21.9

## 2012-08-13 SURGERY — COLONOSCOPY WITH PROPOFOL
Anesthesia: Monitor Anesthesia Care

## 2012-08-13 MED ORDER — PROPOFOL INFUSION 10 MG/ML OPTIME
INTRAVENOUS | Status: DC | PRN
  Administered 2012-08-13: 200 ug/kg/min via INTRAVENOUS

## 2012-08-13 MED ORDER — LACTATED RINGERS IV SOLN
INTRAVENOUS | Status: DC | PRN
  Administered 2012-08-13: 12:00:00 via INTRAVENOUS

## 2012-08-13 MED ORDER — SODIUM CHLORIDE 0.9 % IV SOLN: INTRAVENOUS | Status: DC

## 2012-08-13 MED ORDER — MIDAZOLAM HCL 5 MG/5ML IJ SOLN
INTRAMUSCULAR | Status: DC | PRN
  Administered 2012-08-13 (×2): 1 mg via INTRAVENOUS

## 2012-08-13 SURGICAL SUPPLY — 24 items

## 2012-08-13 NOTE — Preoperative (Signed)
Beta Blockers   Reason not to administer Beta Blockers:Not Applicable 

## 2012-08-13 NOTE — Anesthesia Preprocedure Evaluation (Signed)
Anesthesia Evaluation    Airway Mallampati: II TM Distance: >3 FB Neck ROM: Full   Comment: Dental work done yesterday. Pt shows modereate to severe facial swelling, primarily over upper cheeckbones. Mouth opening is adequate and intraoral swelling is abscent Dental No notable dental hx.    Pulmonary Current Smoker,  breath sounds clear to auscultation  Pulmonary exam normal       Cardiovascular hypertension, Pt. on medications Rhythm:Regular Rate:Normal     Neuro/Psych    GI/Hepatic GERD-  ,(+) Cirrhosis -  Esophageal Varices     , Hepatitis -, B and C  Endo/Other    Renal/GU      Musculoskeletal   Abdominal   Peds  Hematology   Anesthesia Other Findings   Reproductive/Obstetrics                           Anesthesia Physical Anesthesia Plan  ASA: III  Anesthesia Plan: MAC   Post-op Pain Management:    Induction: Intravenous  Airway Management Planned: Simple Face Mask  Additional Equipment:   Intra-op Plan:   Post-operative Plan:   Informed Consent: I have reviewed the patients History and Physical, chart, labs and discussed the procedure including the risks, benefits and alternatives for the proposed anesthesia with the patient or authorized representative who has indicated his/her understanding and acceptance.   Dental advisory given  Plan Discussed with:   Anesthesia Plan Comments:         Anesthesia Quick Evaluation

## 2012-08-13 NOTE — Transfer of Care (Signed)
Immediate Anesthesia Transfer of Care Note  Patient: Mark Christensen  Procedure(s) Performed: Procedure(s) (LRB) with comments: COLONOSCOPY WITH PROPOFOL (N/A)  Patient Location: PACU and Endoscopy Unit  Anesthesia Type:MAC  Level of Consciousness: patient cooperative  Airway & Oxygen Therapy: Patient Spontanous Breathing and Patient connected to face mask oxygen  Post-op Assessment: Report given to PACU RN and Post -op Vital signs reviewed and stable  Post vital signs: Reviewed and stable  Complications: No apparent anesthesia complications

## 2012-08-13 NOTE — Anesthesia Postprocedure Evaluation (Signed)
  Anesthesia Post-op Note  Patient: Mark Christensen  Procedure(s) Performed: Procedure(s) (LRB): COLONOSCOPY WITH PROPOFOL (N/A)  Patient Location: PACU  Anesthesia Type: MAC  Level of Consciousness: awake and alert   Airway and Oxygen Therapy: Patient Spontanous Breathing  Post-op Pain: mild  Post-op Assessment: Post-op Vital signs reviewed, Patient's Cardiovascular Status Stable, Respiratory Function Stable, Patent Airway and No signs of Nausea or vomiting  Post-op Vital Signs: stable  Complications: No apparent anesthesia complications

## 2012-08-13 NOTE — Op Note (Signed)
[facility] [address] [place], [address]   COLONOSCOPY PROCEDURE REPORT  PATIENT: Mark, Christensen  MR#: [id number] BIRTHDATE: 1961-08-10 , 51  yrs. old GENDER: Male ENDOSCOPIST: Willis Modena, MD REFERRED [id number] [name], M.D. PROCEDURE DATE:  08/13/2012 PROCEDURE:   Colonoscopy with snare polypectomy ASA CLASS:   Class III INDICATIONS:Average risk patient for colon cancer. MEDICATIONS: MAC sedation, administered by CRNA  DESCRIPTION OF PROCEDURE:   After the risks benefits and alternatives of the procedure were thoroughly explained, informed consent was obtained.  A digital rectal exam revealed no abnormalities of the rectum.   The Pentax Ped Colon [id number] endoscope was introduced through the anus and advanced to the cecum, which was identified by both the appendix and ileocecal valve. No adverse events experienced.   The quality of the prep was fair.  The instrument was then slowly withdrawn as the colon was fully examined.    Findings:  Normal digital rectal exam.  Prep quality was diffusely fair, with opaque semi-solid liquid obscuring some views throughout the colon; diminutive or subtle sessile polyps could have been missed.  5mm ileocecal valve polyp and 5mm sigmoid polyp, removed with cold snare (bottle 1).  Several small hyperplastic-appearing polyps seen in rectum, extensively sampled with cold snare (bottle 2).  No other polyps, masses, vascular ectasias, or inflammatory changes were seen.  No diverticula evident.       Retroflexed views revealed no abnormalities.  Withdrawal time was 23 minutes.  The scope was withdrawn and the procedure completed.  ENDOSCOPIC IMPRESSION:     As above.  RECOMMENDATIONS:     1.  Watch for potential complications of procedure. 2.  Await polypectomy results. 3.  Avoid ASA/NSAIDs x 3 days. 4.  Repeat colonoscopy in 5 years. 5.  Will need endoscopy in a few weeks (after swelling from his recent oral  surgery subsides) to evaluate his upper GI tract (RUQ pain) symptoms.  eSigned:  Willis Modena, MD 08/13/2012 12:35 PM   cc:

## 2012-08-13 NOTE — H&P (Signed)
Patient interval history reviewed.  Patient examined again.  There has been no change from documented H/P dated 07/31/12 (scanned into chart from our office) except as documented above.  Assessment:  1.  Right upper quadrant pain. 2.  Average-risk colon cancer screening.  Plan:  1.  Colonoscopy today. 2.  Will defer upper endoscopy at this time, given patient's recent oral surgery with significant oral swelling. 3.  Risks (bleeding, infection, bowel perforation that could require surgery, sedation-related changes in cardiopulmonary systems), benefits (identification and possible treatment of source of symptoms, exclusion of certain causes of symptoms), and alternatives (watchful waiting, radiographic imaging studies, empiric medical treatment) of colonoscopy were explained to patient/wife in detail and he wishes to proceed.

## 2012-08-14 ENCOUNTER — Encounter (HOSPITAL_COMMUNITY): Payer: Self-pay | Admitting: Gastroenterology

## 2012-08-27 ENCOUNTER — Emergency Department (HOSPITAL_COMMUNITY)
Admission: EM | Admit: 2012-08-27 | Discharge: 2012-08-27 | Disposition: A | Discharge status: Home / Self Care | Payer: Medicaid Other | Attending: Emergency Medicine | Admitting: Emergency Medicine

## 2012-08-27 ENCOUNTER — Encounter (HOSPITAL_COMMUNITY): Payer: Self-pay | Admitting: *Deleted

## 2012-08-27 DIAGNOSIS — Z8619 Personal history of other infectious and parasitic diseases: Secondary | ICD-10-CM | POA: Insufficient documentation

## 2012-08-27 DIAGNOSIS — M129 Arthropathy, unspecified: Secondary | ICD-10-CM | POA: Insufficient documentation

## 2012-08-27 DIAGNOSIS — I10 Essential (primary) hypertension: Secondary | ICD-10-CM | POA: Insufficient documentation

## 2012-08-27 DIAGNOSIS — Z79899 Other long term (current) drug therapy: Secondary | ICD-10-CM | POA: Insufficient documentation

## 2012-08-27 DIAGNOSIS — K7460 Unspecified cirrhosis of liver: Secondary | ICD-10-CM | POA: Insufficient documentation

## 2012-08-27 DIAGNOSIS — R109 Unspecified abdominal pain: Secondary | ICD-10-CM | POA: Insufficient documentation

## 2012-08-27 DIAGNOSIS — F17200 Nicotine dependence, unspecified, uncomplicated: Secondary | ICD-10-CM | POA: Insufficient documentation

## 2012-08-27 DIAGNOSIS — K219 Gastro-esophageal reflux disease without esophagitis: Secondary | ICD-10-CM | POA: Insufficient documentation

## 2012-08-27 DIAGNOSIS — G2581 Restless legs syndrome: Secondary | ICD-10-CM | POA: Insufficient documentation

## 2012-08-27 LAB — COMPREHENSIVE METABOLIC PANEL
CO2: 24 mEq/L (ref 19–32)
Calcium: 9.6 mg/dL (ref 8.4–10.5)
Chloride: 102 mEq/L (ref 96–112)
Creatinine, Ser: 0.75 mg/dL (ref 0.50–1.35)
GFR calc Af Amer: >90 mL/min (ref >90)
GFR calc non Af Amer: >90 mL/min (ref >90)
Glucose, Bld: 96 mg/dL (ref 70–99)
Total Bilirubin: 0.4 mg/dL (ref 0.3–1.2)

## 2012-08-27 LAB — CBC WITH DIFFERENTIAL/PLATELET
Basophils Relative: 1 % (ref 0–1)
Eosinophils Relative: 3 % (ref 0–5)
HCT: 42.8 % (ref 39.0–52.0)
Hemoglobin: 15.2 g/dL (ref 13.0–17.0)
Lymphs Abs: 1.3 10*3/uL (ref 0.7–4.0)
MCH: 31.3 pg (ref 26.0–34.0)
MCV: 88.2 fL (ref 78.0–100.0)
Monocytes Absolute: 0.5 10*3/uL (ref 0.1–1.0)
Monocytes Relative: 12 % (ref 3–12)
Neutro Abs: 2.1 10*3/uL (ref 1.7–7.7)
RBC: 4.85 MIL/uL (ref 4.22–5.81)
WBC: 4 10*3/uL (ref 4.0–10.5)

## 2012-08-27 MED ORDER — HYDROMORPHONE HCL PF 1 MG/ML IJ SOLN
0.5000 mg | Freq: Once | INTRAMUSCULAR | Status: AC
  Administered 2012-08-27: 0.5 mg via INTRAVENOUS
  Filled 2012-08-27: qty 1

## 2012-08-27 MED ORDER — OXYCODONE HCL 5 MG PO TABS
10.0000 mg | ORAL_TABLET | Freq: Once | ORAL | Status: AC
  Administered 2012-08-27: 10 mg via ORAL
  Filled 2012-08-27: qty 2

## 2012-08-27 MED ORDER — OXYCODONE HCL 5 MG PO TABS: 10.0000 mg | ORAL_TABLET | Freq: Four times a day (QID) | ORAL | Status: DC | PRN

## 2012-08-27 MED ORDER — ONDANSETRON HCL 4 MG/2ML IJ SOLN
4.0000 mg | Freq: Once | INTRAMUSCULAR | Status: AC
  Administered 2012-08-27: 4 mg via INTRAVENOUS
  Filled 2012-08-27: qty 2

## 2012-08-27 MED ORDER — SODIUM CHLORIDE 0.9 % IV BOLUS (SEPSIS)
1000.0000 mL | Freq: Once | INTRAVENOUS | Status: AC
  Administered 2012-08-27: 1000 mL via INTRAVENOUS

## 2012-08-27 MED ORDER — OXYCODONE HCL 10 MG PO TABS: 10.0000 mg | ORAL_TABLET | Freq: Once | ORAL | Status: DC

## 2012-08-27 NOTE — ED Notes (Signed)
NP at bedside.

## 2012-08-27 NOTE — ED Notes (Signed)
Per EMS report; pt from home: Pt c/o of abd pain, n/v.  Pt does have a hx of chronic abd pain and diarrhea but increased abd pain tonight along n/v x 2 days.  Pt denies blood in stool or emesis.  Pt reports having surgery on 10/19 to remove polyps and on 10/17 to remove teeth. Hx of hep b, hep c, flesh eating bacteria

## 2012-08-27 NOTE — ED Provider Notes (Signed)
Date: 08/27/2012  Rate: 77  Rhythm: normal sinus rhythm  QRS Axis: normal  Intervals: normal  ST/T Wave abnormalities: nonspecific ST changes  Conduction Disutrbances:none  Narrative Interpretation:   Old EKG Reviewed: none available  Medical screening examination/treatment/procedure(s) were performed by non-physician practitioner and as supervising physician I was immediately available for consultation/collaboration.    Sunnie Nielsen, MD 08/27/12 [phone]

## 2012-08-27 NOTE — ED Provider Notes (Addendum)
History     CSN: [id number]  Arrival date & time 08/27/12  0057   First MD Initiated Contact with Patient 08/27/12 0143      No chief complaint on file.   (Consider location/radiation/quality/duration/timing/severity/associated sxs/prior treatment) HPI Comments: pt from home: Pt c/o of abd pain, n/v.  Pt does have a hx of chronic abd pain and diarrhea but increased abd pain tonight along n/v x 2 days.  Pt denies blood in stool or emesis.  Pt reports having surgery on 10/19 to remove polyps and on 10/17 to remove teeth. Hx of hep b, hep c, flesh eating bacteria  The history is provided by the patient.    Past Medical History  Diagnosis Date  . Hepatitis B   . Hepatic cirrhosis   . Hepatitis C   . Hypertension     NO LONGER TAKING B/P MEDS--STATES DISCONTINUED BY HIS DOCTOR  . Anxiety   . Shortness of breath   . GERD (gastroesophageal reflux disease)   . Headache     HX OF MIGRAINES  . Arthritis   . Restless leg syndrome   . Complication of anesthesia     ENDO/COLONOSCOPY ATTEMPTED IN THE PAST-UNABLE TO ADEQUATELY SEDATE PT-TOLD WOULD NEED ANESTHESIA FOR FUTURE PROCEDURES    Past Surgical History  Procedure Date  . Appendectomy   . 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 Esophageal banding     FOR TX OF GI BLEED ABOUT 2008  . Arm debridement 08-11-12    right forearm wound with debridement 2 months ago/ right abdomen also-healing well  . Multiple tooth extractions 08/12/2012  . Colonoscopy with propofol 08/13/2012    Procedure: COLONOSCOPY WITH PROPOFOL;  Surgeon: Willis Modena, MD;  Location: [facility];  Service: Endoscopy;  Laterality: N/A;    No family history on file.  History  Substance Use Topics  . Smoking status: Current Every Day Smoker -- 0.5 packs/day for 30 years    Types: Cigarettes  . Smokeless tobacco: Never Used  . Alcohol Use: No     Comment: RARELY USES ALCOHOL      Review of Systems  Constitutional: Negative  for fever.  HENT: Negative.   Eyes: Negative.   Respiratory: Negative for cough.   Cardiovascular: Negative for chest pain.  Gastrointestinal: Positive for nausea, vomiting, abdominal pain, diarrhea and abdominal distention. Negative for blood in stool.  Genitourinary: Negative.   Musculoskeletal: Negative.   Skin: Negative for color change.  Neurological: Negative.   Hematological: Negative.   Psychiatric/Behavioral: Negative.     Allergies  Tylenol; Morphine and related; and Tramadol  Home Medications   Current Outpatient Rx  Name  Route  Sig  Dispense  Refill  . AMITRIPTYLINE HCL 25 MG PO TABS   Oral   Take 25 mg by mouth at bedtime.         . OMEPRAZOLE 20 MG PO CPDR   Oral   Take 20 mg by mouth daily.         . OXYCODONE-ACETAMINOPHEN 10-325 MG PO TABS   Oral   Take 1 tablet by mouth every 4 (four) hours as needed. Pain         . ROPINIROLE HCL 3 MG PO TABS   Oral   Take 3 mg by mouth at bedtime.         . VENLAFAXINE HCL 75 MG PO TABS   Oral   Take 75 mg by mouth  2 (two) times daily.         . OXYCODONE HCL 5 MG PO TABS   Oral   Take 1 tablet (5 mg total) by mouth every 4 (four) hours as needed for pain.   6 tablet   0   . OXYCODONE HCL 10 MG PO TABS   Oral   Take 1 tablet (10 mg total) by mouth once.   20 tablet   0     BP 153/99  Pulse 79  Temp 98.1 F (36.7 C) (Oral)  Resp 23  Ht 6\' 4"  (1.93 m)  Wt 260 lb (117.935 kg)  BMI 31.65 kg/m2  SpO2 97%  Physical Exam  Constitutional: He is oriented to person, place, and time. He appears well-developed and well-nourished.  HENT:  Head: Normocephalic.  Neck: Normal range of motion.  Cardiovascular: Normal rate.   Pulmonary/Chest: Effort normal.  Abdominal: Soft. He exhibits no distension. There is tenderness. There is no rebound.  Musculoskeletal: Normal range of motion.  Neurological: He is alert and oriented to person, place, and time.  Skin: Skin is warm.    ED Course   Procedures (including critical care time)  Labs Reviewed  CBC WITH DIFFERENTIAL - Abnormal; Notable for the following:    Platelets 112 (*)     All other components within normal limits  COMPREHENSIVE METABOLIC PANEL - Abnormal; Notable for the following:    Potassium 6.3 (*)     Albumin 3.3 (*)     AST 64 (*)     All other components within normal limits  POTASSIUM   No results found.   1. Abdominal pain   2. Liver cirrhosis       MDM  5:48 AM been reexamined.  More comfortable and no longer having any nausea or vomiting.  He is able to drink by mouth fluids.  Abdomen is soft.  Bowel sounds are positive.  At this time.  He, states, that he no longer has oxycodone at home, as he uses last tablet today to he, states he has an appointment with Dr. Leretha Dykes.  Next week        Arman Filter, NP 08/27/12 4782  Arman Filter, NP 10/07/12 2329

## 2012-08-27 NOTE — ED Notes (Signed)
Bed:WA24<BR> Expected date:<BR> Expected time:<BR> Means of arrival:<BR> Comments:<BR> EMS

## 2012-09-08 ENCOUNTER — Emergency Department (HOSPITAL_COMMUNITY)
Admission: EM | Admit: 2012-09-08 | Discharge: 2012-09-08 | Disposition: A | Discharge status: Home / Self Care | Payer: Medicaid Other | Attending: Emergency Medicine | Admitting: Emergency Medicine

## 2012-09-08 ENCOUNTER — Encounter (HOSPITAL_COMMUNITY): Payer: Self-pay | Admitting: *Deleted

## 2012-09-08 DIAGNOSIS — R5381 Other malaise: Secondary | ICD-10-CM | POA: Insufficient documentation

## 2012-09-08 DIAGNOSIS — G2581 Restless legs syndrome: Secondary | ICD-10-CM | POA: Insufficient documentation

## 2012-09-08 DIAGNOSIS — M129 Arthropathy, unspecified: Secondary | ICD-10-CM | POA: Insufficient documentation

## 2012-09-08 DIAGNOSIS — F17200 Nicotine dependence, unspecified, uncomplicated: Secondary | ICD-10-CM | POA: Insufficient documentation

## 2012-09-08 DIAGNOSIS — Z8719 Personal history of other diseases of the digestive system: Secondary | ICD-10-CM | POA: Insufficient documentation

## 2012-09-08 DIAGNOSIS — R109 Unspecified abdominal pain: Secondary | ICD-10-CM | POA: Insufficient documentation

## 2012-09-08 DIAGNOSIS — K921 Melena: Secondary | ICD-10-CM | POA: Insufficient documentation

## 2012-09-08 DIAGNOSIS — Z79899 Other long term (current) drug therapy: Secondary | ICD-10-CM | POA: Insufficient documentation

## 2012-09-08 DIAGNOSIS — K219 Gastro-esophageal reflux disease without esophagitis: Secondary | ICD-10-CM | POA: Insufficient documentation

## 2012-09-08 DIAGNOSIS — R5383 Other fatigue: Secondary | ICD-10-CM | POA: Insufficient documentation

## 2012-09-08 DIAGNOSIS — R110 Nausea: Secondary | ICD-10-CM | POA: Insufficient documentation

## 2012-09-08 DIAGNOSIS — K625 Hemorrhage of anus and rectum: Secondary | ICD-10-CM | POA: Insufficient documentation

## 2012-09-08 DIAGNOSIS — G43909 Migraine, unspecified, not intractable, without status migrainosus: Secondary | ICD-10-CM | POA: Insufficient documentation

## 2012-09-08 DIAGNOSIS — I10 Essential (primary) hypertension: Secondary | ICD-10-CM | POA: Insufficient documentation

## 2012-09-08 DIAGNOSIS — F411 Generalized anxiety disorder: Secondary | ICD-10-CM | POA: Insufficient documentation

## 2012-09-08 LAB — CBC WITH DIFFERENTIAL/PLATELET
Basophils Absolute: 0 10*3/uL (ref 0.0–0.1)
Eosinophils Absolute: 0.2 10*3/uL (ref 0.0–0.7)
Eosinophils Relative: 5 % (ref 0–5)
Lymphocytes Relative: 41 % (ref 12–46)
Lymphs Abs: 1.4 10*3/uL (ref 0.7–4.0)
MCH: 30.8 pg (ref 26.0–34.0)
MCV: 89.5 fL (ref 78.0–100.0)
Neutrophils Relative %: 43 % (ref 43–77)
Platelets: 93 10*3/uL — ABNORMAL LOW (ref 150–400)
RBC: 4.58 MIL/uL (ref 4.22–5.81)
RDW: 14.1 % (ref 11.5–15.5)
WBC: 3.3 10*3/uL — ABNORMAL LOW (ref 4.0–10.5)

## 2012-09-08 LAB — TYPE AND SCREEN
ABO/RH(D): O POS
Antibody Screen: NEGATIVE

## 2012-09-08 LAB — URINALYSIS, ROUTINE W REFLEX MICROSCOPIC
Bilirubin Urine: NEGATIVE
Glucose, UA: NEGATIVE mg/dL
Hgb urine dipstick: NEGATIVE
Specific Gravity, Urine: 1.015 (ref 1.005–1.030)
pH: 7 (ref 5.0–8.0)

## 2012-09-08 LAB — COMPREHENSIVE METABOLIC PANEL
ALT: 89 U/L — ABNORMAL HIGH (ref 0–53)
AST: 72 U/L — ABNORMAL HIGH (ref 0–37)
Alkaline Phosphatase: 145 U/L — ABNORMAL HIGH (ref 39–117)
Calcium: 9.6 mg/dL (ref 8.4–10.5)
Glucose, Bld: 98 mg/dL (ref 70–99)
Potassium: 3.9 mEq/L (ref 3.5–5.1)
Sodium: 136 mEq/L (ref 135–145)
Total Protein: 7.8 g/dL (ref 6.0–8.3)

## 2012-09-08 LAB — ABO/RH: ABO/RH(D): O POS

## 2012-09-08 LAB — OCCULT BLOOD, POC DEVICE: Fecal Occult Bld: POSITIVE — AB

## 2012-09-08 MED ORDER — OXYCODONE HCL 5 MG PO TABS: 10.0000 mg | ORAL_TABLET | Freq: Four times a day (QID) | ORAL | Status: DC | PRN

## 2012-09-08 MED ORDER — HYDROMORPHONE HCL PF 1 MG/ML IJ SOLN
1.0000 mg | Freq: Once | INTRAMUSCULAR | Status: AC
  Administered 2012-09-08: 1 mg via INTRAMUSCULAR
  Filled 2012-09-08: qty 1

## 2012-09-08 NOTE — ED Notes (Signed)
Pt dressed into gown and ready to see provider

## 2012-09-08 NOTE — ED Provider Notes (Signed)
History     CSN: [id number]  Arrival date & time 09/08/12  1302   First MD Initiated Contact with Patient 09/08/12 1502      Chief Complaint  Patient presents with  . Rectal Bleeding    (Consider location/radiation/quality/duration/timing/severity/associated sxs/prior treatment) HPI  The patient presents after a single episode of rectal bleeding.  He notes that he has a long history of abdominal pain, attributed to his cirrhosis, hepatitis.  He states that over the past days he continues to have.  The pain is right sided, nonradiating, sore.  There is associated nausea.  There is no vomiting.  Today, just prior to arrival the patient had an episode of bleeding with defecation.  There was no associated rectal pain. Notably, the patient had a colonoscopy one month ago.  Since that event he has had daily diarrhea, but no prior blood in stool.  He denies any current lightheadedness, syncope, dyspnea.  Past Medical History  Diagnosis Date  . Hepatitis B   . Hepatic cirrhosis   . Hepatitis C   . Hypertension     NO LONGER TAKING B/P MEDS--STATES DISCONTINUED BY HIS DOCTOR  . Anxiety   . Shortness of breath   . GERD (gastroesophageal reflux disease)   . Headache     HX OF MIGRAINES  . Arthritis   . Restless leg syndrome   . Complication of anesthesia     ENDO/COLONOSCOPY ATTEMPTED IN THE PAST-UNABLE TO ADEQUATELY SEDATE PT-TOLD WOULD NEED ANESTHESIA FOR FUTURE PROCEDURES    Past Surgical History  Procedure Date  . Appendectomy   . 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 Esophageal banding     FOR TX OF GI BLEED ABOUT 2008  . Arm debridement 08-11-12    right forearm wound with debridement 2 months ago/ right abdomen also-healing well  . Multiple tooth extractions 08/12/2012  . Colonoscopy with propofol 08/13/2012    Procedure: COLONOSCOPY WITH PROPOFOL;  Surgeon: Willis Modena, MD;  Location: [facility];  Service: Endoscopy;  Laterality: N/A;     No family history on file.  History  Substance Use Topics  . Smoking status: Current Every Day Smoker -- 0.5 packs/day for 30 years    Types: Cigarettes  . Smokeless tobacco: Never Used  . Alcohol Use: No     Comment: RARELY USES ALCOHOL      Review of Systems  Constitutional:       Per HPI, otherwise negative  HENT:       Per HPI, otherwise negative  Eyes: Negative.   Respiratory:       Per HPI, otherwise negative  Cardiovascular:       Per HPI, otherwise negative  Gastrointestinal: Positive for nausea, abdominal pain and blood in stool. Negative for vomiting and constipation.  Genitourinary: Negative.   Musculoskeletal:       Per HPI, otherwise negative  Skin: Negative.   Neurological: Positive for weakness. Negative for dizziness, syncope, light-headedness and headaches.    Allergies  Tylenol; Morphine and related; and Tramadol  Home Medications   Current Outpatient Rx  Name  Route  Sig  Dispense  Refill  . AMITRIPTYLINE HCL 25 MG PO TABS   Oral   Take 25 mg by mouth at bedtime.         . OMEPRAZOLE 20 MG PO CPDR   Oral   Take 20 mg by mouth daily.         Marland Kitchen  OXYCODONE HCL 5 MG PO TABS   Oral   Take 10 mg by mouth every 6 (six) hours as needed. For pain         . OXYCODONE-ACETAMINOPHEN 10-325 MG PO TABS   Oral   Take 1 tablet by mouth every 4 (four) hours as needed. Pain         . ROPINIROLE HCL 3 MG PO TABS   Oral   Take 3 mg by mouth at bedtime.         . OXYCODONE HCL 5 MG PO TABS   Oral   Take 5 mg by mouth every 4 (four) hours as needed. For pain           BP 125/87  Pulse 91  Temp 98.4 F (36.9 C) (Oral)  Resp 16  SpO2 99%  Physical Exam  Nursing note and vitals reviewed. Constitutional: He is oriented to person, place, and time. He appears well-developed. No distress.  HENT:  Head: Normocephalic and atraumatic.  Eyes: Conjunctivae normal and EOM are normal.  Cardiovascular: Normal rate and regular rhythm.    Pulmonary/Chest: Effort normal. No stridor. No respiratory distress.  Abdominal: He exhibits no distension.       Large abdomen, minimal ttp, no guarding.  Genitourinary: Rectal exam shows external hemorrhoid and fissure. Guaiac positive stool.       Trace red blood near posterior rectum, small fissure appreciable.  Brown stool  Musculoskeletal: He exhibits no edema.  Neurological: He is alert and oriented to person, place, and time.  Skin: Skin is warm and dry.  Psychiatric: He has a normal mood and affect.    ED Course  Procedures (including critical care time)  Labs Reviewed  CBC WITH DIFFERENTIAL - Abnormal; Notable for the following:    WBC 3.3 (*)     Platelets 93 (*)  PLATELET COUNT CONFIRMED BY SMEAR   Neutro Abs 1.4 (*)     All other components within normal limits  COMPREHENSIVE METABOLIC PANEL - Abnormal; Notable for the following:    Albumin 3.4 (*)     AST 72 (*)     ALT 89 (*)     Alkaline Phosphatase 145 (*)     All other components within normal limits  LIPASE, BLOOD  TYPE AND SCREEN  URINALYSIS, ROUTINE W REFLEX MICROSCOPIC  ABO/RH   No results found.   No diagnosis found.  Pulse ox 99% room air normal Cardiac 85 sinus rhythm normal   4:58 PM Patient's vital signs remained unremarkable.  He was made aware of all results.  With a normal hemoglobin, normal vital signs, and his capacity to followup with his GI doctor, as well as his primary care physician, the patient is appropriate for continued close outpatient management this event may be secondary to his recent colonoscopy or a slightly excoriated fissure-like area on his rectal exam.  MDM  This male with cirrhosis, hepatitis, now presents after syncopal episode with rectal bleeding.  On exam he is in no distress.  She has unremarkable vital signs, is mentating well, with an abdomen that is soft.  Given these findings are some suspicion for acute ongoing pathology, though with anyone with liver  disease, variceal bleeding is a consideration.  Absent syncope, lightheadedness, hypotension, distress, and with an appropriate hemoglobin, the patient is appropriate for close outpatient followup and further evaluation, with his gastroenterologist.  He was made aware of this, discharged with explicit return precautions, analgesics.    Gerhard Munch, MD  09/08/12 1700 

## 2012-09-08 NOTE — ED Notes (Signed)
Patient said he had a colonoscopy done three weeks ago to remove some polyps.  Patient said he's been having abdominal pain since this morning and had an episode of frank bleeding from his rectum.  Patient said he has had diarrhea for three weeks and this morning he thought it was diarrhea and when he turned to look at the commode it was all blood.

## 2012-09-08 NOTE — ED Notes (Signed)
Pt is here with rectal bleeding that started today.  Pt states did not feel good yesterday and was having abdominal pain.  Pt still having abdominal.  Pt reports nausea and no vomiting.

## 2012-09-24 DIAGNOSIS — J189 Pneumonia, unspecified organism: Secondary | ICD-10-CM

## 2012-09-24 HISTORY — DX: Pneumonia, unspecified organism: J18.9

## 2012-10-07 NOTE — ED Provider Notes (Signed)
Medical screening examination/treatment/procedure(s) were performed by non-physician practitioner and as supervising physician I was immediately available for consultation/collaboration.  Sunnie Nielsen, MD 10/07/12 2352

## 2012-10-26 ENCOUNTER — Emergency Department (HOSPITAL_COMMUNITY): Payer: Medicaid Other

## 2012-10-26 ENCOUNTER — Emergency Department (HOSPITAL_COMMUNITY)
Admission: EM | Admit: 2012-10-26 | Discharge: 2012-10-26 | Disposition: A | Discharge status: Home / Self Care | Payer: Medicaid Other | Attending: Emergency Medicine | Admitting: Emergency Medicine

## 2012-10-26 ENCOUNTER — Encounter (HOSPITAL_COMMUNITY): Payer: Self-pay | Admitting: Emergency Medicine

## 2012-10-26 DIAGNOSIS — J189 Pneumonia, unspecified organism: Secondary | ICD-10-CM

## 2012-10-26 DIAGNOSIS — Z8739 Personal history of other diseases of the musculoskeletal system and connective tissue: Secondary | ICD-10-CM | POA: Insufficient documentation

## 2012-10-26 DIAGNOSIS — R0609 Other forms of dyspnea: Secondary | ICD-10-CM | POA: Insufficient documentation

## 2012-10-26 DIAGNOSIS — J3489 Other specified disorders of nose and nasal sinuses: Secondary | ICD-10-CM | POA: Insufficient documentation

## 2012-10-26 DIAGNOSIS — F411 Generalized anxiety disorder: Secondary | ICD-10-CM | POA: Insufficient documentation

## 2012-10-26 DIAGNOSIS — Z79899 Other long term (current) drug therapy: Secondary | ICD-10-CM | POA: Insufficient documentation

## 2012-10-26 DIAGNOSIS — R05 Cough: Secondary | ICD-10-CM | POA: Insufficient documentation

## 2012-10-26 DIAGNOSIS — I10 Essential (primary) hypertension: Secondary | ICD-10-CM | POA: Insufficient documentation

## 2012-10-26 DIAGNOSIS — R0989 Other specified symptoms and signs involving the circulatory and respiratory systems: Secondary | ICD-10-CM | POA: Insufficient documentation

## 2012-10-26 DIAGNOSIS — R059 Cough, unspecified: Secondary | ICD-10-CM | POA: Insufficient documentation

## 2012-10-26 DIAGNOSIS — R112 Nausea with vomiting, unspecified: Secondary | ICD-10-CM | POA: Insufficient documentation

## 2012-10-26 DIAGNOSIS — Z8619 Personal history of other infectious and parasitic diseases: Secondary | ICD-10-CM | POA: Insufficient documentation

## 2012-10-26 DIAGNOSIS — F17200 Nicotine dependence, unspecified, uncomplicated: Secondary | ICD-10-CM | POA: Insufficient documentation

## 2012-10-26 DIAGNOSIS — R509 Fever, unspecified: Secondary | ICD-10-CM | POA: Insufficient documentation

## 2012-10-26 DIAGNOSIS — Z8709 Personal history of other diseases of the respiratory system: Secondary | ICD-10-CM | POA: Insufficient documentation

## 2012-10-26 DIAGNOSIS — G43909 Migraine, unspecified, not intractable, without status migrainosus: Secondary | ICD-10-CM | POA: Insufficient documentation

## 2012-10-26 DIAGNOSIS — R51 Headache: Secondary | ICD-10-CM | POA: Insufficient documentation

## 2012-10-26 DIAGNOSIS — G2581 Restless legs syndrome: Secondary | ICD-10-CM | POA: Insufficient documentation

## 2012-10-26 DIAGNOSIS — IMO0001 Reserved for inherently not codable concepts without codable children: Secondary | ICD-10-CM | POA: Insufficient documentation

## 2012-10-26 DIAGNOSIS — R079 Chest pain, unspecified: Secondary | ICD-10-CM | POA: Insufficient documentation

## 2012-10-26 DIAGNOSIS — Z8719 Personal history of other diseases of the digestive system: Secondary | ICD-10-CM | POA: Insufficient documentation

## 2012-10-26 DIAGNOSIS — K219 Gastro-esophageal reflux disease without esophagitis: Secondary | ICD-10-CM | POA: Insufficient documentation

## 2012-10-26 LAB — COMPREHENSIVE METABOLIC PANEL
ALT: 56 U/L — ABNORMAL HIGH (ref 0–53)
Alkaline Phosphatase: 97 U/L (ref 39–117)
CO2: 23 mEq/L (ref 19–32)
Chloride: 96 mEq/L (ref 96–112)
GFR calc Af Amer: >90 mL/min (ref >90)
GFR calc non Af Amer: >90 mL/min (ref >90)
Glucose, Bld: 111 mg/dL — ABNORMAL HIGH (ref 70–99)
Potassium: 3.8 mEq/L (ref 3.5–5.1)
Sodium: 131 mEq/L — ABNORMAL LOW (ref 135–145)

## 2012-10-26 LAB — CBC WITH DIFFERENTIAL/PLATELET
Lymphocytes Relative: 7 % — ABNORMAL LOW (ref 12–46)
Lymphs Abs: 0.7 10*3/uL (ref 0.7–4.0)
Neutrophils Relative %: 86 % — ABNORMAL HIGH (ref 43–77)
Platelets: 86 10*3/uL — ABNORMAL LOW (ref 150–400)
RBC: 4.48 MIL/uL (ref 4.22–5.81)
WBC: 8.9 10*3/uL (ref 4.0–10.5)

## 2012-10-26 MED ORDER — LEVOFLOXACIN 750 MG PO TABS: ORAL_TABLET | ORAL | Status: DC

## 2012-10-26 MED ORDER — LEVOFLOXACIN 500 MG PO TABS
750.0000 mg | ORAL_TABLET | Freq: Once | ORAL | Status: AC
  Administered 2012-10-26: 750 mg via ORAL
  Filled 2012-10-26: qty 1

## 2012-10-26 MED ORDER — IPRATROPIUM BROMIDE 0.02 % IN SOLN
0.5000 mg | Freq: Once | RESPIRATORY_TRACT | Status: AC
  Administered 2012-10-26: 0.5 mg via RESPIRATORY_TRACT
  Filled 2012-10-26: qty 2.5

## 2012-10-26 MED ORDER — SODIUM CHLORIDE 0.9 % IV BOLUS (SEPSIS)
1000.0000 mL | Freq: Once | INTRAVENOUS | Status: AC
  Administered 2012-10-26: 1000 mL via INTRAVENOUS

## 2012-10-26 MED ORDER — ONDANSETRON HCL 4 MG/2ML IJ SOLN
4.0000 mg | INTRAMUSCULAR | Status: AC
  Administered 2012-10-26: 4 mg via INTRAVENOUS
  Filled 2012-10-26: qty 2

## 2012-10-26 MED ORDER — HYDROMORPHONE HCL PF 1 MG/ML IJ SOLN
1.0000 mg | Freq: Once | INTRAMUSCULAR | Status: AC
  Administered 2012-10-26: 1 mg via INTRAVENOUS
  Filled 2012-10-26: qty 1

## 2012-10-26 MED ORDER — ALBUTEROL SULFATE (5 MG/ML) 0.5% IN NEBU
2.5000 mg | INHALATION_SOLUTION | Freq: Once | RESPIRATORY_TRACT | Status: AC
  Administered 2012-10-26: 2.5 mg via RESPIRATORY_TRACT
  Filled 2012-10-26: qty 0.5

## 2012-10-26 NOTE — ED Provider Notes (Signed)
Medical screening examination/treatment/procedure(s) were performed by non-physician practitioner and as supervising physician I was immediately available for consultation/collaboration.   Suzi Roots, MD 10/26/12 [phone]

## 2012-10-26 NOTE — ED Provider Notes (Signed)
History     CSN: [id number]  Arrival date & time 10/26/12  1007   First MD Initiated Contact with Patient 10/26/12 1009      Chief Complaint  Patient presents with  . Weakness    started last night  . Emesis    started last night    (Consider location/radiation/quality/duration/timing/severity/associated sxs/prior treatment) HPI Comments: Patient with PMH of hepatitis, hepatic cirrhosis, and HTN presents complaining of myalgias, fever, and URI symptoms since yesterday. Patient states symptoms worsened around midnight at which time he woke up and had an episode of non-bloody, non-bilious emesis. Patient has had approx 6 episodes of emesis since last night. States wife took his temperature last night which was 102. Admits to headaches, rhinorrhea, congestion, cough, nausea, and some difficulty breathing. Admits to sick contacts, specifically his boss who is "in hospital for pneumonia". Is a 0.5ppd smoker since age 22. Patient also has a hx of alcoholism but states he has not had a drink in 5 years. Admits to having his flu shot and pneumonia shot this year. Denies vision changes, abdominal pain, diarrhea, urinary symptoms, and blood in his stool.  Patient is a 52 y.o. male presenting with weakness and vomiting. The history is provided by the patient. No language interpreter was used.  Weakness The primary symptoms include headaches, fever, nausea and vomiting. Primary symptoms do not include syncope or loss of consciousness. The symptoms began yesterday. The symptoms are unchanged.  The headache is associated with weakness.  Additional symptoms include weakness.  Emesis  Associated symptoms include cough, a fever, headaches and myalgias. Pertinent negatives include no abdominal pain and no diarrhea.    Past Medical History  Diagnosis Date  . Hepatitis B   . Hepatic cirrhosis   . Hepatitis C   . Hypertension     NO LONGER TAKING B/P MEDS--STATES DISCONTINUED BY HIS DOCTOR  . Anxiety    . Shortness of breath   . GERD (gastroesophageal reflux disease)   . Headache     HX OF MIGRAINES  . Arthritis   . Restless leg syndrome   . Complication of anesthesia     ENDO/COLONOSCOPY ATTEMPTED IN THE PAST-UNABLE TO ADEQUATELY SEDATE PT-TOLD WOULD NEED ANESTHESIA FOR FUTURE PROCEDURES    Past Surgical History  Procedure Date  . Appendectomy   . 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 Esophageal banding     FOR TX OF GI BLEED ABOUT 2008  . Arm debridement 08-11-12    right forearm wound with debridement 2 months ago/ right abdomen also-healing well  . Multiple tooth extractions 08/12/2012  . Colonoscopy with propofol 08/13/2012    Procedure: COLONOSCOPY WITH PROPOFOL;  Surgeon: Willis Modena, MD;  Location: [facility];  Service: Endoscopy;  Laterality: N/A;    No family history on file.  History  Substance Use Topics  . Smoking status: Current Every Day Smoker -- 0.5 packs/day for 30 years    Types: Cigarettes  . Smokeless tobacco: Never Used  . Alcohol Use: No     Comment: RARELY USES ALCOHOL      Review of Systems  Constitutional: Positive for fever.  HENT: Positive for congestion and rhinorrhea.   Respiratory: Positive for cough and chest tightness. Negative for shortness of breath.   Cardiovascular: Negative for chest pain and syncope.  Gastrointestinal: Positive for nausea and vomiting. Negative for abdominal pain, diarrhea and blood in stool.  Genitourinary: Negative for dysuria, hematuria and  difficulty urinating.  Musculoskeletal: Positive for myalgias.  Neurological: Positive for weakness and headaches. Negative for loss of consciousness.    Allergies  Tylenol; Morphine and related; and Tramadol  Home Medications   Current Outpatient Rx  Name  Route  Sig  Dispense  Refill  . AMITRIPTYLINE HCL 25 MG PO TABS   Oral   Take 25 mg by mouth at bedtime.         . OMEPRAZOLE 20 MG PO CPDR   Oral   Take 20 mg by mouth  daily.         . OXYCODONE HCL 5 MG PO CAPS   Oral   Take 10 mg by mouth every 6 (six) hours as needed. Pain         . ROPINIROLE HCL 3 MG PO TABS   Oral   Take 3 mg by mouth at bedtime.         Marland Kitchen LEVOFLOXACIN 750 MG PO TABS      Take 1 pill every day starting 10/27/2012 for 4 days   4 tablet   0   . OXYCODONE HCL 5 MG PO TABS   Oral   Take 5 mg by mouth every 4 (four) hours as needed. For pain           BP 116/67  Pulse 74  Temp 98.7 F (37.1 C) (Oral)  Resp 18  SpO2 96%  Physical Exam  Nursing note and vitals reviewed. Constitutional: He appears well-developed and well-nourished. No distress.  HENT:  Head: Normocephalic and atraumatic.  Eyes:       Left eye PERRL; legally blind in R eye  Neck: Normal range of motion.  Cardiovascular: Regular rhythm and normal heart sounds.        Mildly tachycardic  Pulmonary/Chest: Effort normal.       +adventitious sounds; patient daily smoker  Abdominal: Soft. Bowel sounds are normal. He exhibits no mass. There is tenderness. There is no guarding.       Tenderness on palpation in epigastric region  Musculoskeletal: Normal range of motion. He exhibits no edema.  Lymphadenopathy:    He has cervical adenopathy.  Neurological: He is alert.  Skin: Skin is warm.  Psychiatric: He has a normal mood and affect. His behavior is normal.    ED Course  Procedures (including critical care time)  Labs Reviewed  CBC WITH DIFFERENTIAL - Abnormal; Notable for the following:    Platelets 86 (*)     Neutrophils Relative 86 (*)     Lymphocytes Relative 7 (*)     All other components within normal limits  COMPREHENSIVE METABOLIC PANEL - Abnormal; Notable for the following:    Sodium 131 (*)     Glucose, Bld 111 (*)     Albumin 3.3 (*)     AST 39 (*)     ALT 56 (*)     All other components within normal limits  LIPASE, BLOOD   Dg Chest 2 View  10/26/2012  *RADIOLOGY REPORT*  Clinical Data: Fever, shortness of breath.  CHEST -  2 VIEW  Comparison: 05/06/2012  Findings: Small bilateral pleural effusions.  Patchy atelectasis versus early infiltrates posteriorly in both lower lobes, left greater than right.  Heart size remains normal.  No overt interstitial edema.  Regional bones unremarkable.  IMPRESSION:  Small effusions with adjacent atelectasis/infiltrate posteriorly in the lower lobes.   Original Report Authenticated By: D. Andria Rhein, MD      1. Community  acquired pneumonia       MDM  Patient presents for myalgias, fever, URI symptoms and emesis since yesterday that became worse at midnight. Extensive PMH of hepatitis, hepatic cirrhosis, and HTN and admits to sick contacts with pneumonia. Have ordered CBC, CMP, and CXR to evaluate associated breathing difficulty. Lipase also ordered as patient has hx of alcohol abuse and exhibited epigastric tenderness on exam; denies having a drink in 5 years.  CXR shows small effusions with adjacent atelectasis/infiltrate posteriorly in the lower lobes. Possible with sick contacts that patient has touch of pneumonia. Has also been put on 2L for difficulty breathing; will give neb treatment to attempt to improve shortness of breath. Will then check pulse ox while walking the patient. Patient states he was admitted to the hospital at [facility] within the last 90 days; unable to definitively determine with past notes. Will treat with levaquin for possible pneumonia and give first dose in ED today.  Patient says breathing has improved since receiving the breathing treatment; is less short of breath. O2 sat on RA at 97%. Will check sats while ambulating in hallway. If sats remain stable will discharge home with instruction to continue abx, follow up with PCP, Dr. Jamey Ripa, and to return to ED if symptoms worsen.  Antony Madura, PA-C 10/26/12 1323

## 2012-10-26 NOTE — ED Notes (Signed)
[id number]<JY> Expected date:<BR> Expected time:<BR> Means of arrival:<BR> Comments:<BR> Generalized weakness

## 2012-10-26 NOTE — ED Notes (Signed)
Was working on car yesterday when pt started feeling bad, aches all over. Vomited through night multiple times.

## 2012-11-04 ENCOUNTER — Other Ambulatory Visit: Payer: Self-pay | Admitting: Gastroenterology

## 2012-11-04 DIAGNOSIS — R109 Unspecified abdominal pain: Secondary | ICD-10-CM

## 2012-11-06 ENCOUNTER — Encounter (HOSPITAL_COMMUNITY): Payer: Self-pay | Admitting: *Deleted

## 2012-11-06 ENCOUNTER — Emergency Department (HOSPITAL_COMMUNITY): Payer: Medicaid Other

## 2012-11-06 ENCOUNTER — Emergency Department (HOSPITAL_COMMUNITY)
Admission: EM | Admit: 2012-11-06 | Discharge: 2012-11-07 | Disposition: A | Discharge status: Home / Self Care | Payer: Medicaid Other | Attending: Emergency Medicine | Admitting: Emergency Medicine

## 2012-11-06 DIAGNOSIS — F17200 Nicotine dependence, unspecified, uncomplicated: Secondary | ICD-10-CM | POA: Insufficient documentation

## 2012-11-06 DIAGNOSIS — I10 Essential (primary) hypertension: Secondary | ICD-10-CM | POA: Insufficient documentation

## 2012-11-06 DIAGNOSIS — R05 Cough: Secondary | ICD-10-CM | POA: Insufficient documentation

## 2012-11-06 DIAGNOSIS — Z8679 Personal history of other diseases of the circulatory system: Secondary | ICD-10-CM | POA: Insufficient documentation

## 2012-11-06 DIAGNOSIS — R0602 Shortness of breath: Secondary | ICD-10-CM | POA: Insufficient documentation

## 2012-11-06 DIAGNOSIS — J3489 Other specified disorders of nose and nasal sinuses: Secondary | ICD-10-CM | POA: Insufficient documentation

## 2012-11-06 DIAGNOSIS — R6883 Chills (without fever): Secondary | ICD-10-CM | POA: Insufficient documentation

## 2012-11-06 DIAGNOSIS — K219 Gastro-esophageal reflux disease without esophagitis: Secondary | ICD-10-CM | POA: Insufficient documentation

## 2012-11-06 DIAGNOSIS — K7460 Unspecified cirrhosis of liver: Secondary | ICD-10-CM | POA: Insufficient documentation

## 2012-11-06 DIAGNOSIS — B1910 Unspecified viral hepatitis B without hepatic coma: Secondary | ICD-10-CM | POA: Insufficient documentation

## 2012-11-06 DIAGNOSIS — Z8659 Personal history of other mental and behavioral disorders: Secondary | ICD-10-CM | POA: Insufficient documentation

## 2012-11-06 DIAGNOSIS — Z8739 Personal history of other diseases of the musculoskeletal system and connective tissue: Secondary | ICD-10-CM | POA: Insufficient documentation

## 2012-11-06 DIAGNOSIS — R059 Cough, unspecified: Secondary | ICD-10-CM | POA: Insufficient documentation

## 2012-11-06 DIAGNOSIS — B1710 Acute hepatitis C without hepatic coma: Secondary | ICD-10-CM | POA: Insufficient documentation

## 2012-11-06 DIAGNOSIS — Z79899 Other long term (current) drug therapy: Secondary | ICD-10-CM | POA: Insufficient documentation

## 2012-11-06 LAB — URINALYSIS, ROUTINE W REFLEX MICROSCOPIC
Bilirubin Urine: NEGATIVE
Leukocytes, UA: NEGATIVE
Nitrite: NEGATIVE
Specific Gravity, Urine: 1.015 (ref 1.005–1.030)
pH: 6 (ref 5.0–8.0)

## 2012-11-06 LAB — COMPREHENSIVE METABOLIC PANEL
Albumin: 3.3 g/dL — ABNORMAL LOW (ref 3.5–5.2)
BUN: 12 mg/dL (ref 6–23)
Chloride: 103 mEq/L (ref 96–112)
Creatinine, Ser: 0.74 mg/dL (ref 0.50–1.35)
Total Bilirubin: 0.9 mg/dL (ref 0.3–1.2)
Total Protein: 8.2 g/dL (ref 6.0–8.3)

## 2012-11-06 LAB — URINE MICROSCOPIC-ADD ON

## 2012-11-06 LAB — CBC
MCV: 86.9 fL (ref 78.0–100.0)
Platelets: 95 10*3/uL — ABNORMAL LOW (ref 150–400)
RBC: 5.03 MIL/uL (ref 4.22–5.81)
WBC: 12.3 10*3/uL — ABNORMAL HIGH (ref 4.0–10.5)

## 2012-11-06 MED ORDER — IBUPROFEN 400 MG PO TABS
600.0000 mg | ORAL_TABLET | Freq: Once | ORAL | Status: AC
  Administered 2012-11-06: 600 mg via ORAL
  Filled 2012-11-06: qty 2

## 2012-11-06 MED ORDER — ALBUTEROL SULFATE HFA 108 (90 BASE) MCG/ACT IN AERS: 1.0000 | INHALATION_SPRAY | RESPIRATORY_TRACT | Status: DC | PRN

## 2012-11-06 MED ORDER — ALBUTEROL SULFATE HFA 108 (90 BASE) MCG/ACT IN AERS
6.0000 | INHALATION_SPRAY | Freq: Once | RESPIRATORY_TRACT | Status: AC
  Administered 2012-11-06: 2 via RESPIRATORY_TRACT
  Filled 2012-11-06: qty 6.7

## 2012-11-06 MED ORDER — SODIUM CHLORIDE 0.9 % IV BOLUS (SEPSIS)
500.0000 mL | Freq: Once | INTRAVENOUS | Status: AC
  Administered 2012-11-06: 500 mL via INTRAVENOUS

## 2012-11-06 NOTE — ED Provider Notes (Signed)
I have supervised the resident on the management of this patient and agree with the note above. I personally interviewed and examined the patient and my addendum is below.   Mark Christensen is a 52 y.o. male recent pneumonia here with SOB. SOB x 2 days, also has dysuria. Has low grade temp. Will get labs and CXR to r/o pneumonia. Will get UA to r/o UTI. Patient well appearing, not hypoxic or hypotensive.   11:48 PM CXR nl, UA nl, WBC 12. Well appearing, felt better after nebs. Stable for d/c.   Results for orders placed during the hospital encounter of 11/06/12  COMPREHENSIVE METABOLIC PANEL      Result Value Range   Sodium 137  135 - 145 mEq/L   Potassium 3.7  3.5 - 5.1 mEq/L   Chloride 103  96 - 112 mEq/L   CO2 23  19 - 32 mEq/L   Glucose, Bld 101 (*) 70 - 99 mg/dL   BUN 12  6 - 23 mg/dL   Creatinine, Ser 6.96  0.50 - 1.35 mg/dL   Calcium 9.3  8.4 - 29.5 mg/dL   Total Protein 8.2  6.0 - 8.3 g/dL   Albumin 3.3 (*) 3.5 - 5.2 g/dL   AST 78 (*) 0 - 37 U/L   ALT 73 (*) 0 - 53 U/L   Alkaline Phosphatase 142 (*) 39 - 117 U/L   Total Bilirubin 0.9  0.3 - 1.2 mg/dL   GFR calc non Af Amer >90  >90 mL/min   GFR calc Af Amer >90  >90 mL/min  CBC      Result Value Range   WBC 12.3 (*) 4.0 - 10.5 K/uL   RBC 5.03  4.22 - 5.81 MIL/uL   Hemoglobin 15.1  13.0 - 17.0 g/dL   HCT 28.4  13.2 - 44.0 %   MCV 86.9  78.0 - 100.0 fL   MCH 30.0  26.0 - 34.0 pg   MCHC 34.6  30.0 - 36.0 g/dL   RDW 10.2  72.5 - 36.6 %   Platelets 95 (*) 150 - 400 K/uL  URINALYSIS, ROUTINE W REFLEX MICROSCOPIC      Result Value Range   Color, Urine AMBER (*) YELLOW   APPearance CLEAR  CLEAR   Specific Gravity, Urine 1.015  1.005 - 1.030   pH 6.0  5.0 - 8.0   Glucose, UA NEGATIVE  NEGATIVE mg/dL   Hgb urine dipstick TRACE (*) NEGATIVE   Bilirubin Urine NEGATIVE  NEGATIVE   Ketones, ur NEGATIVE  NEGATIVE mg/dL   Protein, ur NEGATIVE  NEGATIVE mg/dL   Urobilinogen, UA 1.0  0.0 - 1.0 mg/dL   Nitrite NEGATIVE  NEGATIVE    Leukocytes, UA NEGATIVE  NEGATIVE  LIPASE, BLOOD      Result Value Range   Lipase 13  11 - 59 U/L  URINE MICROSCOPIC-ADD ON      Result Value Range   WBC, UA 0-2  <3 WBC/hpf   RBC / HPF 3-6  <3 RBC/hpf   Bacteria, UA RARE  RARE   Urine-Other MUCOUS PRESENT     Dg Chest 2 View  11/06/2012  *RADIOLOGY REPORT*  Clinical Data: Sob; cough; fever; smoker; htn  CHEST - 2 VIEW  Comparison: 10/26/2012  Findings: Cardiac and mediastinal contours appear normal.  The lungs appear clear.  No pleural effusion is identified.  IMPRESSION:  No significant abnormality identified.   Original Report Authenticated By: Gaylyn Rong, M.D.    Dg Chest 2  View  10/26/2012  *RADIOLOGY REPORT*  Clinical Data: Fever, shortness of breath.  CHEST - 2 VIEW  Comparison: 05/06/2012  Findings: Small bilateral pleural effusions.  Patchy atelectasis versus early infiltrates posteriorly in both lower lobes, left greater than right.  Heart size remains normal.  No overt interstitial edema.  Regional bones unremarkable.  IMPRESSION:  Small effusions with adjacent atelectasis/infiltrate posteriorly in the lower lobes.   Original Report Authenticated By: D. Andria Rhein, MD       Richardean Canal, MD 11/06/12 [phone]

## 2012-11-06 NOTE — ED Provider Notes (Signed)
History     CSN: [id number]  Arrival date & time 11/06/12  2207   First MD Initiated Contact with Patient 11/06/12 2212      Chief Complaint  Patient presents with  . Shortness of Breath    (Consider location/radiation/quality/duration/timing/severity/associated sxs/prior treatment) Patient is a 52 y.o. male presenting with general illness. The history is provided by the patient. No language interpreter was used.  Illness  The current episode started 5 to 7 days ago. The onset was gradual. The problem occurs frequently. The problem has been gradually worsening. The problem is moderate. Nothing relieves the symptoms. Nothing aggravates the symptoms. Associated symptoms include a fever, abdominal pain (chronic), vomiting (chronic), congestion and cough. Pertinent negatives include no diarrhea, no nausea, no headaches, no rhinorrhea, no sore throat, no stridor, no neck pain and no rash. He has been less active. He has been drinking less than usual and eating less than usual. Urine output has been normal. Recently, medical care has been given at this facility. Services received include medications given (levaquin given 2/2 for pna).    Past Medical History  Diagnosis Date  . Hepatic cirrhosis   . Hypertension     NO LONGER TAKING B/P MEDS--STATES DISCONTINUED BY HIS DOCTOR  . Anxiety   . Shortness of breath   . GERD (gastroesophageal reflux disease)   . Headache     HX OF MIGRAINES  . Arthritis   . Restless leg syndrome   . Complication of anesthesia     ENDO/COLONOSCOPY ATTEMPTED IN THE PAST-UNABLE TO ADEQUATELY SEDATE PT-TOLD WOULD NEED ANESTHESIA FOR FUTURE PROCEDURES  . Hepatitis B   . Hepatitis C     Past Surgical History  Procedure Laterality Date  . Appendectomy    . 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 Esophageal banding      FOR TX OF GI BLEED ABOUT 2008  . Arm debridement  08-11-12    right forearm wound with debridement 2 months ago/  right abdomen also-healing well  . Multiple tooth extractions  08/12/2012  . Colonoscopy with propofol  08/13/2012    Procedure: COLONOSCOPY WITH PROPOFOL;  Surgeon: Willis Modena, MD;  Location: [facility];  Service: Endoscopy;  Laterality: N/A;    No family history on file.  History  Substance Use Topics  . Smoking status: Current Every Day Smoker -- 0.50 packs/day for 30 years    Types: Cigarettes  . Smokeless tobacco: Never Used  . Alcohol Use: No     Comment: RARELY USES ALCOHOL      Review of Systems  Constitutional: Positive for fever, chills, activity change, appetite change and fatigue. Negative for diaphoresis.  HENT: Positive for congestion. Negative for sore throat, facial swelling, rhinorrhea, drooling, neck pain and voice change.   Respiratory: Positive for cough. Negative for shortness of breath and stridor.   Gastrointestinal: Positive for vomiting (chronic) and abdominal pain (chronic). Negative for nausea, diarrhea and abdominal distention.  Endocrine: Negative for polydipsia and polyuria.  Genitourinary: Negative for dysuria, urgency, frequency and decreased urine volume.  Musculoskeletal: Negative for back pain and gait problem.  Skin: Negative for color change, rash and wound.  Neurological: Negative for facial asymmetry, weakness, numbness and headaches.  Hematological: Does not bruise/bleed easily.  Psychiatric/Behavioral: Negative for confusion and agitation.    Allergies  Tylenol; Morphine and related; and Tramadol  Home Medications   Current Outpatient Rx  Name  Route  Sig  Dispense  Refill  . amitriptyline (ELAVIL) 25 MG tablet   Oral   Take 25 mg by mouth at bedtime.         Marland Kitchen omeprazole (PRILOSEC) 20 MG capsule   Oral   Take 20 mg by mouth daily.         Marland Kitchen rOPINIRole (REQUIP) 3 MG tablet   Oral   Take 3 mg by mouth at bedtime.         Marland Kitchen albuterol (PROVENTIL HFA;VENTOLIN HFA) 108 (90 BASE) MCG/ACT inhaler   Inhalation   Inhale  1-2 puffs into the lungs every 4 (four) hours as needed for wheezing.   1 Inhaler   0   . levofloxacin (LEVAQUIN) 750 MG tablet      Take 1 pill every day starting 10/27/2012 for 4 days   4 tablet   0     BP 126/73  Pulse 100  Temp(Src) 97.9 F (36.6 C)  Resp 16  SpO2 93%  Physical Exam  Constitutional: He is oriented to person, place, and time. He appears well-developed and well-nourished. No distress.  HENT:  Head: Normocephalic and atraumatic.  Mouth/Throat: No oropharyngeal exudate.  Eyes: Pupils are equal, round, and reactive to light.  Neck: Normal range of motion. Neck supple.  Cardiovascular: Normal rate, regular rhythm and normal heart sounds.  Exam reveals no gallop and no friction rub.   No murmur heard. Pulmonary/Chest: Effort normal. No respiratory distress. He has wheezes. He has no rales.  Abdominal: Soft. Bowel sounds are normal. He exhibits no distension and no mass. There is tenderness (chronic, unchanged) in the right upper quadrant. There is no rebound and no guarding.  Musculoskeletal: Normal range of motion. He exhibits no edema and no tenderness.  Neurological: He is alert and oriented to person, place, and time.  Skin: Skin is warm and dry.  Psychiatric: He has a normal mood and affect.    ED Course  Procedures (including critical care time)  Labs Reviewed  COMPREHENSIVE METABOLIC PANEL - Abnormal; Notable for the following:    Glucose, Bld 101 (*)    Albumin 3.3 (*)    AST 78 (*)    ALT 73 (*)    Alkaline Phosphatase 142 (*)    All other components within normal limits  CBC - Abnormal; Notable for the following:    WBC 12.3 (*)    Platelets 95 (*)    All other components within normal limits  URINALYSIS, ROUTINE W REFLEX MICROSCOPIC - Abnormal; Notable for the following:    Color, Urine AMBER (*)    Hgb urine dipstick TRACE (*)    All other components within normal limits  URINE CULTURE  LIPASE, BLOOD  URINE MICROSCOPIC-ADD ON   Dg  Chest 2 View  11/06/2012  *RADIOLOGY REPORT*  Clinical Data: Sob; cough; fever; smoker; htn  CHEST - 2 VIEW  Comparison: 10/26/2012  Findings: Cardiac and mediastinal contours appear normal.  The lungs appear clear.  No pleural effusion is identified.  IMPRESSION:  No significant abnormality identified.   Original Report Authenticated By: Gaylyn Rong, M.D.    EKG: normal EKG, normal sinus rhythm, unchanged from previous tracings.   1. Shortness of breath   2. Chills       MDM  11:49 PM Pt is a 52 y.o. male with pertinent PMHX of hep C, cirrhosis,  HTN, anxiety, GERD, diagnosed with 2/2 placed on levaquin who presents with worsening of his symptoms today with return of chills &  episode of diaphoresis as well as reported T of 101.  VSS, afebrile, pt in NAD upon arrival, slight wheezing heard at bases.  Abdomen soft, mild RUQ ttp that is chronic per pt.  Ddx includes pna, COPD exacerbation, UTI. Have ordered CXR, CBC, CMP, lipase, UA.    11:49 PM CXR clear w/o opacities or effusion that was seen prior.  Mild leukocytosis.   11:49 PM Pt feeling somewhat improved after albuterol MDI.  Urine not infected. Mild AST/ALT/ALK phos as seen previously.  I believe pt safe to be d/c home.  Will recommend continued albuterol use as this may be component of COPD given long-standing tobacco abuse.  Return precautions given for new or worsening symptoms.  Pt will f/u with PCP.   1. Shortness of breath   2. Chills     Labs and imaging considered in decision making, reviewed by myself.  Imaging interpreted by radiology. Pt care discussed with my attending, Dr. Silverio Lay.         Toy Cookey, MD 11/07/12 [phone]

## 2012-11-06 NOTE — ED Notes (Addendum)
Recovering from pna z 2 weeks ago. Called ems for sob. Last x 2 days decrease urination, diaphoresis, poor appetitie, n/v, fevers. Having stomach issues, i.e. Stomach cancer. Scheduled to have biopsy soon. Took 400 mg of ibuprofen this afternoon for fever.

## 2012-11-07 ENCOUNTER — Encounter (HOSPITAL_COMMUNITY): Payer: Self-pay | Admitting: *Deleted

## 2012-11-07 MED ORDER — OXYCODONE HCL 5 MG PO TABS
5.0000 mg | ORAL_TABLET | Freq: Once | ORAL | Status: AC
  Administered 2012-11-07: 5 mg via ORAL
  Filled 2012-11-07: qty 1

## 2012-11-07 NOTE — Pre-Procedure Instructions (Addendum)
Your procedure is scheduled on:Wednesday, November 26, 2012 Report to [name] Admitting [id number] Call this number if you have problems morning of your [phone]  Follow all bowel prep instructions per your doctor's orders.  Do not eat or drink anything after midnight the night before your procedure. You may brush your teeth, rinse out your mouth, but no water, no food, no chewing gum, no mints, no candies, no chewing tobacco.     Take these medicines the morning of your procedure with A SIP OF WATER:Prilosec   Please make arrangements for a responsible person to drive you home after the procedure. You cannot go home by cab/taxi. We recommend you have someone with you at home the first 24 hours after your procedure. Driver for procedure is wife Mark Christensen [phone]  LEAVE ALL VALUABLES, JEWELRY, BILLFOLD AT HOME.  NO DENTURES, CONTACT LENSES ALLOWED IN THE ENDOSCOPY ROOM.   YOU MAY WEAR DEODORANT, PLEASE REMOVE ALL JEWELRY, WATCHES RINGS, BODY PIERCINGS AND LEAVE AT HOME.   WOMEN: NO MAKE-UP, LOTIONS PERFUMES

## 2012-11-08 ENCOUNTER — Other Ambulatory Visit: Payer: Self-pay

## 2012-11-08 LAB — URINE CULTURE

## 2012-11-11 ENCOUNTER — Ambulatory Visit
Admission: RE | Admit: 2012-11-11 | Discharge: 2012-11-11 | Disposition: A | Discharge status: Home / Self Care | Payer: Medicaid Other | Source: Ambulatory Visit | Attending: Gastroenterology | Admitting: Gastroenterology

## 2012-11-11 DIAGNOSIS — R109 Unspecified abdominal pain: Secondary | ICD-10-CM

## 2012-11-17 ENCOUNTER — Encounter (HOSPITAL_COMMUNITY): Payer: Self-pay | Admitting: Emergency Medicine

## 2012-11-17 ENCOUNTER — Inpatient Hospital Stay (HOSPITAL_COMMUNITY)
Admission: EM | Admit: 2012-11-17 | Discharge: 2012-11-20 | DRG: 432 | Disposition: A | Discharge status: Home / Self Care | Payer: Medicaid Other | Attending: Internal Medicine | Admitting: Internal Medicine

## 2012-11-17 DIAGNOSIS — Z79899 Other long term (current) drug therapy: Secondary | ICD-10-CM

## 2012-11-17 DIAGNOSIS — K920 Hematemesis: Secondary | ICD-10-CM | POA: Diagnosis present

## 2012-11-17 DIAGNOSIS — K319 Disease of stomach and duodenum, unspecified: Secondary | ICD-10-CM | POA: Diagnosis present

## 2012-11-17 DIAGNOSIS — Z860100 Personal history of colon polyps, unspecified: Secondary | ICD-10-CM

## 2012-11-17 DIAGNOSIS — I998 Other disorder of circulatory system: Secondary | ICD-10-CM | POA: Diagnosis present

## 2012-11-17 DIAGNOSIS — I10 Essential (primary) hypertension: Secondary | ICD-10-CM

## 2012-11-17 DIAGNOSIS — B1910 Unspecified viral hepatitis B without hepatic coma: Secondary | ICD-10-CM

## 2012-11-17 DIAGNOSIS — F411 Generalized anxiety disorder: Secondary | ICD-10-CM | POA: Diagnosis present

## 2012-11-17 DIAGNOSIS — G2581 Restless legs syndrome: Secondary | ICD-10-CM | POA: Diagnosis present

## 2012-11-17 DIAGNOSIS — F1020 Alcohol dependence, uncomplicated: Secondary | ICD-10-CM | POA: Diagnosis present

## 2012-11-17 DIAGNOSIS — M19049 Primary osteoarthritis, unspecified hand: Secondary | ICD-10-CM | POA: Diagnosis present

## 2012-11-17 DIAGNOSIS — Y849 Medical procedure, unspecified as the cause of abnormal reaction of the patient, or of later complication, without mention of misadventure at the time of the procedure: Secondary | ICD-10-CM | POA: Diagnosis present

## 2012-11-17 DIAGNOSIS — B181 Chronic viral hepatitis B without delta-agent: Secondary | ICD-10-CM | POA: Diagnosis present

## 2012-11-17 DIAGNOSIS — K219 Gastro-esophageal reflux disease without esophagitis: Secondary | ICD-10-CM | POA: Diagnosis present

## 2012-11-17 DIAGNOSIS — B1920 Unspecified viral hepatitis C without hepatic coma: Secondary | ICD-10-CM

## 2012-11-17 DIAGNOSIS — K7460 Unspecified cirrhosis of liver: Principal | ICD-10-CM

## 2012-11-17 DIAGNOSIS — F17200 Nicotine dependence, unspecified, uncomplicated: Secondary | ICD-10-CM | POA: Diagnosis present

## 2012-11-17 DIAGNOSIS — R109 Unspecified abdominal pain: Secondary | ICD-10-CM

## 2012-11-17 DIAGNOSIS — R0789 Other chest pain: Secondary | ICD-10-CM | POA: Diagnosis not present

## 2012-11-17 DIAGNOSIS — I808 Phlebitis and thrombophlebitis of other sites: Secondary | ICD-10-CM | POA: Diagnosis present

## 2012-11-17 DIAGNOSIS — K766 Portal hypertension: Secondary | ICD-10-CM | POA: Diagnosis present

## 2012-11-17 DIAGNOSIS — I8511 Secondary esophageal varices with bleeding: Secondary | ICD-10-CM | POA: Diagnosis present

## 2012-11-17 LAB — URINALYSIS, ROUTINE W REFLEX MICROSCOPIC
Hgb urine dipstick: NEGATIVE
Nitrite: NEGATIVE
Specific Gravity, Urine: 1.031 — ABNORMAL HIGH (ref 1.005–1.030)
Urobilinogen, UA: 2 mg/dL — ABNORMAL HIGH (ref 0.0–1.0)

## 2012-11-17 LAB — CBC WITH DIFFERENTIAL/PLATELET
Hemoglobin: 14.5 g/dL (ref 13.0–17.0)
Lymphs Abs: 0.5 10*3/uL — ABNORMAL LOW (ref 0.7–4.0)
Monocytes Relative: 4 % (ref 3–12)
Neutro Abs: 6.6 10*3/uL (ref 1.7–7.7)
Neutrophils Relative %: 89 % — ABNORMAL HIGH (ref 43–77)
Platelets: 89 10*3/uL — ABNORMAL LOW (ref 150–400)
RBC: 4.64 MIL/uL (ref 4.22–5.81)
WBC: 7.4 10*3/uL (ref 4.0–10.5)

## 2012-11-17 LAB — POCT I-STAT, CHEM 8
Creatinine, Ser: 1.1 mg/dL (ref 0.50–1.35)
Glucose, Bld: 99 mg/dL (ref 70–99)
Hemoglobin: 14.3 g/dL (ref 13.0–17.0)
Sodium: 137 mEq/L (ref 135–145)
TCO2: 25 mmol/L (ref 0–100)

## 2012-11-17 LAB — URINE MICROSCOPIC-ADD ON

## 2012-11-17 LAB — LIPASE, BLOOD: Lipase: 16 U/L (ref 11–59)

## 2012-11-17 LAB — TYPE AND SCREEN: ABO/RH(D): O POS

## 2012-11-17 LAB — PROTIME-INR
INR: 1.2 (ref 0.00–1.49)
Prothrombin Time: 15 seconds (ref 11.6–15.2)

## 2012-11-17 MED ORDER — OCTREOTIDE ACETATE 50 MCG/ML IJ SOLN: 50.0000 ug | Freq: Once | INTRAMUSCULAR | Status: DC

## 2012-11-17 MED ORDER — PANTOPRAZOLE SODIUM 40 MG IV SOLR
80.0000 mg | Freq: Once | INTRAVENOUS | Status: AC
  Administered 2012-11-17: 80 mg via INTRAVENOUS
  Filled 2012-11-17: qty 80

## 2012-11-17 MED ORDER — HYDROMORPHONE HCL PF 1 MG/ML IJ SOLN
1.0000 mg | Freq: Once | INTRAMUSCULAR | Status: AC
  Administered 2012-11-17: 1 mg via INTRAVENOUS
  Filled 2012-11-17: qty 1

## 2012-11-17 MED ORDER — OCTREOTIDE LOAD VIA INFUSION
50.0000 ug | Freq: Once | INTRAVENOUS | Status: AC
  Administered 2012-11-18: 50 ug via INTRAVENOUS
  Filled 2012-11-17: qty 25

## 2012-11-17 MED ORDER — SODIUM CHLORIDE 0.9 % IV SOLN
INTRAVENOUS | Status: AC
  Administered 2012-11-17 – 2012-11-18 (×2): via INTRAVENOUS

## 2012-11-17 MED ORDER — SODIUM CHLORIDE 0.9 % IV SOLN
25.0000 ug/h | INTRAVENOUS | Status: AC
  Administered 2012-11-18: 25 ug/h via INTRAVENOUS
  Filled 2012-11-17 (×3): qty 1

## 2012-11-17 MED ORDER — HYDROMORPHONE HCL PF 1 MG/ML IJ SOLN: 1.0000 mg | INTRAMUSCULAR | Status: DC | PRN

## 2012-11-17 MED ORDER — ONDANSETRON HCL 4 MG/2ML IJ SOLN
4.0000 mg | Freq: Once | INTRAMUSCULAR | Status: AC
  Administered 2012-11-17: 4 mg via INTRAVENOUS
  Filled 2012-11-17: qty 2

## 2012-11-17 MED ORDER — ONDANSETRON HCL 4 MG/2ML IJ SOLN: 4.0000 mg | Freq: Three times a day (TID) | INTRAMUSCULAR | Status: DC | PRN

## 2012-11-17 NOTE — ED Provider Notes (Signed)
History     CSN: [id number]  Arrival date & time 11/17/12  1753   First MD Initiated Contact with Patient 11/17/12 1805      Chief Complaint  Patient presents with  . Hematemesis  . Abdominal Pain    (Consider location/radiation/quality/duration/timing/severity/associated sxs/prior treatment) HPI 52 year old male presents the emergency department with chief complaint of hematemesis.  He shouldn't has a past medical history significant for chronic alcohol abuse, hep B and hep C positive, and  cirrhosis of the liver. Patient states that proximally 5 hours ago, just prior to arrival.  The patient was leaning over to tie his shoes when he suddenly became very nauseated and extremely ill.  Patient states he he ran out of the house and began vomiting large blood clots followed by coffee ground emesis. Patient states that he has had this problem before but has been unable to obtain upper endoscopy. Upon arrival patient was diaphoretic and extremely ill.  He states he felt extremely nauseated.  He also has chronic abdominal pain in the right lower quadrant.  He is supposed to have exploratory laparotomy for this at some point.  Patient denies any fevers, chills, diarrhea. Denies fevers, chills, myalgias, arthralgias. Denies chest pain. tightness or pressure, radiation to left arm, jaw or back, or diaphoresis. Denies dysuria, flank pain, suprapubic pain, frequency, urgency, or hematuria. Denies headaches, light headedness, weakness, visual disturbances.   Past Medical History  Diagnosis Date  . Hepatic cirrhosis   . Hypertension     NO LONGER TAKING B/P MEDS--STATES DISCONTINUED BY HIS DOCTOR  . Anxiety   . Shortness of breath   . GERD (gastroesophageal reflux disease)   . Restless leg syndrome   . Complication of anesthesia     ENDO/COLONOSCOPY ATTEMPTED IN THE PAST-UNABLE TO ADEQUATELY SEDATE PT-TOLD WOULD NEED ANESTHESIA FOR FUTURE PROCEDURES  . Pneumonia 09-2012  . Headache     HX OF  MIGRAINES  . Arthritis     hands  . Hepatitis B   . Hepatitis C     Past Surgical History  Procedure Laterality Date  . Appendectomy    . Esophageal banding      FOR TX OF GI BLEED ABOUT 2008  . Arm debridement  08-11-12    right forearm wound with debridement 2 months ago/ right abdomen also-healing well  . Multiple tooth extractions  08/12/2012  . Colonoscopy with propofol  08/13/2012    Procedure: COLONOSCOPY WITH PROPOFOL;  Surgeon: Willis Modena, MD;  Location: [facility];  Service: Endoscopy;  Laterality: N/A;  . 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 No pertinent family history.  History  Substance Use Topics  . Smoking status: Current Every Day Smoker -- 0.50 packs/day for 30 years    Types: Cigarettes  . Smokeless tobacco: Never Used  . Alcohol Use: No     Comment: RARELY USES ALCOHOL      Review of Systems Ten systems reviewed and are negative for acute change, except as noted in the HPI.   Allergies  Tylenol; Morphine and related; and Tramadol  Home Medications   Current Outpatient Rx  Name  Route  Sig  Dispense  Refill  . albuterol (PROVENTIL HFA;VENTOLIN HFA) 108 (90 BASE) MCG/ACT inhaler   Inhalation   Inhale 1-2 puffs into the lungs every 4 (four) hours as needed for wheezing.         Marland Kitchen amitriptyline (ELAVIL) 25 MG  tablet   Oral   Take 25 mg by mouth at bedtime.         Marland Kitchen omeprazole (PRILOSEC) 20 MG capsule   Oral   Take 20 mg by mouth daily.         Marland Kitchen oxyCODONE (OXY IR/ROXICODONE) 5 MG immediate release tablet   Oral   Take 5 mg by mouth every 4 (four) hours as needed for pain.         Marland Kitchen rOPINIRole (REQUIP) 3 MG tablet   Oral   Take 3 mg by mouth at bedtime.           BP 109/63  Pulse 104  Temp(Src) 98.9 F (37.2 C) (Oral)  Resp 18  SpO2 88%  Physical Exam  Nursing note and vitals reviewed. Constitutional: He is oriented to person, place, and time.  HENT:  Head:  Atraumatic.  Eyes: No scleral icterus.  Abnormal pupil in the right eye  Neck: Normal range of motion. Neck supple. No JVD present.  Cardiovascular: Normal rate, regular rhythm and intact distal pulses.   Pulmonary/Chest: Effort normal and breath sounds normal.  Abdominal: He exhibits distension. There is tenderness. There is no rebound and no guarding.  Large distended abdomen.    Musculoskeletal: Normal range of motion.  Neurological: He is alert and oriented to person, place, and time.  Skin: He is diaphoretic.  Diaphoretic     ED Course  Procedures (including critical care time)  Labs Reviewed  CBC WITH DIFFERENTIAL  URINALYSIS, ROUTINE W REFLEX MICROSCOPIC  LIPASE, BLOOD  TYPE AND SCREEN   No results found.  Date: 11/20/2012  Rate: 77  Rhythm: normal sinus rhythm  QRS Axis: normal  Intervals: normal (borderline prolonged QT)  ST/T Wave abnormalities: normal  Conduction Disutrbances: none  Narrative Interpretation:   Old EKG Reviewed: No significant changes noted      No diagnosis found.    MDM  6:50 PM  Patient seen in shared visit with Dr. Denton Lank.  History of cirrhosis and previous Hematemesis. Possibly due to esophageal varices vs, PUD or other etiology. Patient has had 1 episode of vomiting coffee ground emesis since coming to the ED. Patient is diaphoretic and nauseated. I have begun IV saline 2 L through 2 large bore IVs. Patient on 2 L02 via cannula. C/o abdominal pain in RLQ and epigastrium.. This is chronic. Patient Also reciving protonix bolus at 80mg . Labs pending.   8:20 PM FOBT negative. HGB wnl. Patient is thrombocytopenic.  He is not on any blood thinners. C/o n/and pain. Will dose zofran/dilaudid.    8:40  I have spoken with Dr. Tarry Kos who has agreed to admit the patient.  SHe asks that I consult with GI.    9;10 PM I have spoken with Dr. Matthias Hughs who has asked that the patient be placed in the step down unit.  Hi should be begun on  octreotide, PPI drip, IV fluids and NPo for EGD tomorrow. I discussed this information with Dr. Onalee Hua.   12:50, Dr. Onalee Hua spoke with my attneding Dr. Denton Lank about the orders from Dr. Matthias Hughs .  I was inder the impression that Dr. Onalee Hua would place the orders with her admission, however Dr. Onalee Hua assumed tat i would place the orders. This lead to a delay in reciving te medications. Orders were placed by  Dr. Denton Lank.   Arthor Captain, PA-C 11/20/12 2038

## 2012-11-17 NOTE — ED Notes (Signed)
Pt states he is having pain to epigastric area and RUQ and RLQ of abd pain. Pt rates pain 9/10. Pt has decreased n/v due to being given 4mg  zofan by ems pta.

## 2012-11-17 NOTE — H&P (Signed)
PCP:   Jeri Cos, MD   Chief Complaint:  Vomiting blood  HPI: 52 yo male h/o etoh cirrhosis, hep b/c, esophageal varices s/p banding about 4 years ago comes in with sudden onset of vomiting blood clots.  Started off with vomiting several blood clots, then several episodes of bright red blood then progressed to coffee ground material.  Has chronic abd pain and this is no different but has pain in his ruq/epi area.  No melena or brbpr.  No fevers.  No vomiting since arrival to ED.  Nausea is also improved.  Not actively drinking etoh.  Review of Systems:  Positive and negative as per HPI otherwise all other systems are negative  Past Medical History: Past Medical History  Diagnosis Date  . Hepatic cirrhosis   . Hypertension     NO LONGER TAKING B/P MEDS--STATES DISCONTINUED BY HIS DOCTOR  . Anxiety   . Shortness of breath   . GERD (gastroesophageal reflux disease)   . Restless leg syndrome   . Complication of anesthesia     ENDO/COLONOSCOPY ATTEMPTED IN THE PAST-UNABLE TO ADEQUATELY SEDATE PT-TOLD WOULD NEED ANESTHESIA FOR FUTURE PROCEDURES  . Pneumonia 09-2012  . Headache     HX OF MIGRAINES  . Arthritis     hands  . Hepatitis B   . Hepatitis C    Past Surgical History  Procedure Laterality Date  . Appendectomy    . Esophageal banding      FOR TX OF GI BLEED ABOUT 2008  . Arm debridement  08-11-12    right forearm wound with debridement 2 months ago/ right abdomen also-healing well  . Multiple tooth extractions  08/12/2012  . Colonoscopy with propofol  08/13/2012    Procedure: COLONOSCOPY WITH PROPOFOL;  Surgeon: Willis Modena, MD;  Location: [facility];  Service: Endoscopy;  Laterality: N/A;  . 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 Authorizing Provider  albuterol (PROVENTIL HFA;VENTOLIN HFA) 108 (90 BASE) MCG/ACT inhaler Inhale 1-2 puffs into  the lungs every 4 (four) hours as needed for wheezing. 11/06/12  Yes Toy Cookey, MD  amitriptyline (ELAVIL) 25 MG tablet Take 25 mg by mouth at bedtime.   Yes Historical Provider, MD  omeprazole (PRILOSEC) 20 MG capsule Take 20 mg by mouth daily.   Yes Historical Provider, MD  oxyCODONE (OXY IR/ROXICODONE) 5 MG immediate release tablet Take 5 mg by mouth every 4 (four) hours as needed for pain.   Yes Historical Provider, MD  rOPINIRole (REQUIP) 3 MG tablet Take 3 mg by mouth at bedtime.   Yes Historical Provider, MD    Allergies:   Allergies  Allergen Reactions  . Tylenol (Acetaminophen) Other (See Comments)    Cirrhosis  . Morphine And Related Itching    itching  . Tramadol Nausea And Vomiting and Swelling    Abdominal swelling     Social History:  reports that he has been smoking Cigarettes.  He has a 15 pack-year smoking history. He has never used smokeless tobacco. He reports that he does not drink alcohol or use illicit drugs.  Family History: History reviewed. No pertinent family history.  Physical Exam: Filed Vitals:   11/17/12 2145 11/17/12 2200 11/17/12 2215 11/17/12 2230  BP: 94/60 134/79 116/93 119/69  Pulse: 83 82 86 82  Temp:      TempSrc:  Resp: 15 11 21 15   SpO2: 98% 98% 98% 96%   General appearance: alert, cooperative and no distress Neck: no JVD and supple, symmetrical, trachea midline Lungs: clear to auscultation bilaterally Chest wall: no tenderness Heart: regular rate and rhythm, S1, S2 normal, no murmur, click, rub or gallop Abdomen: soft, non-tender; bowel sounds normal; no masses,  no organomegaly Extremities: extremities normal, atraumatic, no cyanosis or edema Pulses: 2+ and symmetric Skin: Skin color, texture, turgor normal. No rashes or lesions Neurologic: Grossly normal    Labs on Admission:   Recent Labs  11/17/12 1913  NA 137  K 3.9  CL 103  GLUCOSE 99  BUN 17  CREATININE 1.10    Recent Labs  11/17/12 1854  LIPASE  16    Recent Labs  11/17/12 1854 11/17/12 1913  WBC 7.4  --   NEUTROABS 6.6  --   HGB 14.5 14.3  HCT 39.9 42.0  MCV 86.0  --   PLT 89*  --    Radiological Exams on Admission: none  Assessment/Plan 52 yo male with h/o cirrhosis and varices with acute ugib  Principal Problem:   Hematemesis/vomiting blood Active Problems:   Hepatitis C   Hepatitis B   Cirrhosis of liver   Hypertension   Abdominal pain  Stepdown with octreotide and protonix gtt.  Gi has been called and will be seeing pt.  Vss.  hgb initially over 13.  Serial h/h q 8 hours.  Pain meds prn.  Keep npo for likely egd tomorrow.  Full code.  Faiz Weber A 11/17/2012, 10:46 PM

## 2012-11-17 NOTE — ED Notes (Signed)
Pt diaphoretic, and tachycardic on monitor, 02 dropped to 88% RA pt placed on 3L [place], o2 came up to 91%.

## 2012-11-17 NOTE — ED Notes (Signed)
Per EMS pt has been having hematemesis around 4-4:15 pm. Pt has Hx GI bleed, Hep B & C, Cirrhosis of Liver, and possible stomach cancer. Pt states last time he had this problem he was dx with esophageal varices, and GI bleed. Pt was vomiting, 2-3 big clots of blood that were dark in color. When EMS arrived he was having bright red emesis, on the ride over pt had 2 more episodes of emesis that were dark in color. Pt has 18g LAC, B/p initially was 200/110, last b/p was 121/77, p 115-120, SPO2 93% RA.

## 2012-11-17 NOTE — ED Notes (Signed)
Pt moved into room from hallway; pt undressed, in gown, on monitor, continuous pulse oximetry and blood pressure cuff

## 2012-11-18 ENCOUNTER — Encounter (HOSPITAL_COMMUNITY)
Admission: EM | Disposition: A | Discharge status: Home / Self Care | Payer: Self-pay | Source: Home / Self Care | Attending: Internal Medicine

## 2012-11-18 ENCOUNTER — Encounter (HOSPITAL_COMMUNITY): Payer: Self-pay

## 2012-11-18 HISTORY — PX: ESOPHAGOGASTRODUODENOSCOPY: SHX5428

## 2012-11-18 HISTORY — PX: ESOPHAGEAL BANDING: SHX5518

## 2012-11-18 LAB — TROPONIN I: Troponin I: <0.3 ng/mL (ref <0.30)

## 2012-11-18 LAB — CK TOTAL AND CKMB (NOT AT ARMC)
CK, MB: 2.6 ng/mL (ref 0.3–4.0)
Relative Index: 2.3 (ref 0.0–2.5)
Total CK: 112 U/L (ref 7–232)

## 2012-11-18 LAB — CBC
Platelets: 61 10*3/uL — ABNORMAL LOW (ref 150–400)
RBC: 4.27 MIL/uL (ref 4.22–5.81)
RDW: 14.8 % (ref 11.5–15.5)
WBC: 3.5 10*3/uL — ABNORMAL LOW (ref 4.0–10.5)

## 2012-11-18 LAB — BASIC METABOLIC PANEL
Calcium: 8.2 mg/dL — ABNORMAL LOW (ref 8.4–10.5)
Chloride: 107 mEq/L (ref 96–112)
Creatinine, Ser: 0.82 mg/dL (ref 0.50–1.35)
GFR calc Af Amer: >90 mL/min (ref >90)
Sodium: 136 mEq/L (ref 135–145)

## 2012-11-18 LAB — HEMOGLOBIN AND HEMATOCRIT, BLOOD
HCT: 37.3 % — ABNORMAL LOW (ref 39.0–52.0)
Hemoglobin: 12.4 g/dL — ABNORMAL LOW (ref 13.0–17.0)

## 2012-11-18 SURGERY — EGD (ESOPHAGOGASTRODUODENOSCOPY)
Anesthesia: Moderate Sedation

## 2012-11-18 MED ORDER — MIDAZOLAM HCL 10 MG/2ML IJ SOLN
INTRAMUSCULAR | Status: DC | PRN
  Administered 2012-11-18 (×3): 2.5 mg via INTRAVENOUS

## 2012-11-18 MED ORDER — PANTOPRAZOLE SODIUM 40 MG IV SOLR
40.0000 mg | Freq: Two times a day (BID) | INTRAVENOUS | Status: DC
  Administered 2012-11-18 – 2012-11-20 (×4): 40 mg via INTRAVENOUS
  Filled 2012-11-18 (×6): qty 40

## 2012-11-18 MED ORDER — FENTANYL CITRATE 0.05 MG/ML IJ SOLN
INTRAMUSCULAR | Status: DC | PRN
  Administered 2012-11-18 (×3): 25 ug via INTRAVENOUS

## 2012-11-18 MED ORDER — SODIUM CHLORIDE 0.9 % IV SOLN: INTRAVENOUS | Status: DC

## 2012-11-18 MED ORDER — ONDANSETRON HCL 4 MG/2ML IJ SOLN
4.0000 mg | Freq: Four times a day (QID) | INTRAMUSCULAR | Status: DC | PRN
  Administered 2012-11-19: 4 mg via INTRAVENOUS
  Filled 2012-11-18: qty 2

## 2012-11-18 MED ORDER — HYDROMORPHONE HCL PF 1 MG/ML IJ SOLN
1.0000 mg | Freq: Once | INTRAMUSCULAR | Status: AC
  Administered 2012-11-18: 1 mg via INTRAVENOUS
  Filled 2012-11-18: qty 1

## 2012-11-18 MED ORDER — HYDROMORPHONE HCL PF 1 MG/ML IJ SOLN
1.0000 mg | INTRAMUSCULAR | Status: DC | PRN
  Administered 2012-11-18 – 2012-11-20 (×12): 1 mg via INTRAVENOUS
  Filled 2012-11-18 (×13): qty 1

## 2012-11-18 MED ORDER — ONDANSETRON HCL 4 MG PO TABS
4.0000 mg | ORAL_TABLET | Freq: Four times a day (QID) | ORAL | Status: DC | PRN
  Filled 2012-11-18: qty 0.5

## 2012-11-18 MED ORDER — MUPIROCIN 2 % EX OINT
1.0000 "application " | TOPICAL_OINTMENT | Freq: Two times a day (BID) | CUTANEOUS | Status: DC
  Administered 2012-11-18 – 2012-11-20 (×5): 1 via NASAL
  Filled 2012-11-18: qty 22

## 2012-11-18 MED ORDER — DIPHENHYDRAMINE HCL 50 MG/ML IJ SOLN
INTRAMUSCULAR | Status: DC | PRN
  Administered 2012-11-18: 25 mg via INTRAVENOUS

## 2012-11-18 MED ORDER — MIDAZOLAM HCL 5 MG/ML IJ SOLN
INTRAMUSCULAR | Status: AC
  Filled 2012-11-18: qty 2

## 2012-11-18 MED ORDER — NICOTINE 21 MG/24HR TD PT24
21.0000 mg | MEDICATED_PATCH | Freq: Every day | TRANSDERMAL | Status: DC
  Administered 2012-11-18 – 2012-11-20 (×3): 21 mg via TRANSDERMAL
  Filled 2012-11-18 (×3): qty 1

## 2012-11-18 MED ORDER — FENTANYL CITRATE 0.05 MG/ML IJ SOLN
INTRAMUSCULAR | Status: AC
  Filled 2012-11-18: qty 2

## 2012-11-18 MED ORDER — BUTAMBEN-TETRACAINE-BENZOCAINE 2-2-14 % EX AERO
INHALATION_SPRAY | CUTANEOUS | Status: DC | PRN
  Administered 2012-11-18: 2 via TOPICAL

## 2012-11-18 MED ORDER — DIPHENHYDRAMINE HCL 50 MG/ML IJ SOLN
INTRAMUSCULAR | Status: AC
  Filled 2012-11-18: qty 1

## 2012-11-18 MED ORDER — SODIUM CHLORIDE 0.9 % IV SOLN
INTRAVENOUS | Status: AC
  Administered 2012-11-18: 11:00:00 via INTRAVENOUS

## 2012-11-18 MED ORDER — SODIUM CHLORIDE 0.9 % IV SOLN
8.0000 mg/h | INTRAVENOUS | Status: DC
  Administered 2012-11-18 (×2): 8 mg/h via INTRAVENOUS
  Filled 2012-11-18 (×6): qty 80

## 2012-11-18 MED ORDER — CHLORHEXIDINE GLUCONATE CLOTH 2 % EX PADS
6.0000 | MEDICATED_PAD | Freq: Every day | CUTANEOUS | Status: DC
  Administered 2012-11-18 – 2012-11-20 (×3): 6 via TOPICAL

## 2012-11-18 NOTE — Progress Notes (Addendum)
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 Previous esophageal banding 4 years prior.  Presented to ER with sudden onset of vomiting clotted blood. Initially bright red blood then progressed to coffee-ground material. Has chronic epigastric abdominal pain without any change in quality of this pain from baseline. Denied melena or bright blood (. No recent fevers or abdominal distention. Endorses not actively drinking alcohol. Hemoglobin at presentation was 14.3. He was also hemodynamically stable.  Assessment/Plan: Principal Problem:   Hematemesis/known esophageal varices -Appreciate GI assistance; they suspect etiology either related to esophageal varices versus Mallory-Weiss tear versus peptic ulcer disease -Status post EGD today with banding of varices x2 -Continue octreotide and Protonix infusions -Hemoglobin stable-we'll follow to determine if develops acute blood loss anemia  Active Problems:   Hepatitis C/Hepatitis B/Cirrhosis of liver -According to the GI note patient was last evaluated outpatient followup Feb. 2014. Laboratory data revealed no signs of active hepatitis B or hepatitis C and no prior evidence of hepatitis A.    Hypertension -Blood pressure currently controlled-not on medications prior to admission    Abdominal pain -Chronic and no clear-cut etiology has yet to be determined   DVT prophylaxis: SCDs Code Status: Full Family Communication: Patient and wife Disposition Plan: Stepdown  Consultants: Gastroenterology  Procedures:  None  Antibiotics: None  HPI/Subjective: Patient continues with same epigastric abdominal pain which is chronic. No further hematemesis or coffee-ground emesis. Denies chest pain or shortness of breath   Objective: Blood pressure  106/78, pulse 66, temperature 98.5 F (36.9 C), temperature source Oral, resp. rate 15, height 6\' 4"  (1.93 m), weight 107.1 kg (236 lb 1.8 oz), SpO2 97.00%.  Intake/Output Summary (Last 24 hours) at 11/18/12 1427 Last data filed at 11/18/12 1200  Gross per 24 hour  Intake 2510.83 ml  Output   1950 ml  Net 560.83 ml     Exam: General: No acute respiratory distress Lungs: Clear to auscultation bilaterally without wheezes or crackles Cardiovascular: Regular rate and rhythm without murmur gallop or rub normal S1 and S2 Abdomen: Nontender, nondistended, soft, bowel sounds positive, no rebound, no ascites, no appreciable mass Extremities: No significant cyanosis, clubbing, or edema bilateral lower extremities  Data Reviewed: Basic Metabolic Panel:  Recent Labs Lab 11/17/12 1913 11/18/12 0500  NA 137 136  K 3.9 4.6  CL 103 107  CO2  --  19  GLUCOSE 99 79  BUN 17 11  CREATININE 1.10 0.82  CALCIUM  --  8.2*   Liver Function Tests: No results found for this basename: AST, ALT, ALKPHOS, BILITOT, PROT, ALBUMIN,  in the last 168 hours  Recent Labs Lab 11/17/12 1854  LIPASE 16   No results found for this basename: AMMONIA,  in the last 168 hours CBC:  Recent Labs Lab 11/17/12 1854 11/17/12 1913 11/18/12 0058 11/18/12 0500 11/18/12 0840  WBC 7.4  --   --  3.5*  --   NEUTROABS 6.6  --   --   --   --   HGB 14.5 14.3 13.1 13.0 12.4*  HCT 39.9 42.0 37.3* 37.8* 35.7*  MCV 86.0  --   --  88.5  --   PLT 89*  --   --  61*  --    Cardiac Enzymes: No results found for this basename: CKTOTAL, CKMB, CKMBINDEX, TROPONINI,  in the  last 168 hours BNP (last 3 results) No results found for this basename: PROBNP,  in the last 8760 hours CBG: No results found for this basename: GLUCAP,  in the last 168 hours  Recent Results (from the past 240 hour(s))  MRSA PCR SCREENING     Status: Abnormal   Collection Time    11/18/12  1:34 AM      Result Value Range Status   MRSA by PCR  POSITIVE (*) NEGATIVE Final   Comment:            The GeneXpert MRSA Assay (FDA     approved for NASAL specimens     only), is one component of a     comprehensive MRSA colonization     surveillance program. It is not     intended to diagnose MRSA     infection nor to guide or     monitor treatment for     MRSA infections.     RESULT CALLED TO, READ BACK BY AND VERIFIED WITH:     CALLED TO RN MATT YORK [phone] @0623  THANEY     Studies:  Recent x-ray studies have been reviewed in detail by the Attending Physician  Scheduled Meds:  Reviewed in detail by the Attending Physician   Junious Silk, ANP [facility] Office  [phone] Pager [phone]  On-Call/Text Page:      [email or web address]      password [email or web address]  If 7PM-7AM, please contact night-coverage [email or web address] Password [facility] 11/18/2012, 2:27 PM   LOS: 1 day   I have examined the patient, reviewed the chart and modified the above note which I agree with.   RIZWAN,SAIMA,MD [phone] 11/18/2012, 6:31 PM

## 2012-11-18 NOTE — Interval H&P Note (Signed)
History and Physical Interval Note:  11/18/2012 3:48 PM  Mark Christensen  has presented today for surgery, with the diagnosis of hematemesis  The various methods of treatment have been discussed with the patient and family. After consideration of risks, benefits and other options for treatment, the patient has consented to  Procedure(s): ESOPHAGOGASTRODUODENOSCOPY (EGD) (N/A) as a surgical intervention .  The patient's history has been reviewed, patient examined, no change in status, stable for surgery.  I have reviewed the patient's chart and labs.  Questions were answered to the patient's satisfaction.     [name] C.

## 2012-11-18 NOTE — Brief Op Note (Signed)
Esophageal varices with site showing recent bleeding - s/p banding of site X 2. See endopro for details. Ice chips and sips of water only today. Continue Octreotide infusion.

## 2012-11-18 NOTE — Progress Notes (Signed)
Utilization review completed.  

## 2012-11-18 NOTE — Consult Note (Signed)
[facility] Consult Note  Referring Provider: No ref. provider found Primary Care Physician:  Jeri Cos, MD Primary Gastroenterologist:  Dr.  Antony Contras Complaint: Hematemesis HPI: Mark Christensen is an 52 y.o. white male  who presents with bright red hematemesis yesterday  , none since hospitalization yesterday at 5 PM. His last stool was 2 days ago and appeared green. He has had intermittent worsening epigastric pain with nonbloody emesis leading up to this. He has been hemodynamically stable since presentation. He has a history of an esophageal variceal bleed in 2010 treated by esophageal banding in [place]. As far as I can tell he has not had a followup endoscopy since. He denies any recent drinking.  Last seen in office infeb 2014, at which time labs showed evidence of prior but not active Hep B infection (core total Ab positive, surface Ab positive; viral load undetectable; surface Ag negative), no evidence of Hep C infection (viral load undetectable), and no prior evidence of Hep A infection (IgM and IgG antibody negative).  At that time, patient continued to have constant epigastric and right upper quadrant pain, worse with inspiration and worse after eating.   Colonoscopy November 2013 showed few small adenomatous and hyperplastic polyps, removed, with 5 year recall suggested; unable to do endoscopy at that time due to his swollen mouth (day after he had most of his teeth pulled). He was scheduled to have an EGD done under propofol as an outpatient next week.  Past Medical History  Diagnosis Date  . Hepatic cirrhosis   . Hypertension     NO LONGER TAKING B/P MEDS--STATES DISCONTINUED BY HIS DOCTOR  . Anxiety   . Shortness of breath   . GERD (gastroesophageal reflux disease)   . Restless leg syndrome   . Complication of anesthesia     ENDO/COLONOSCOPY ATTEMPTED IN THE PAST-UNABLE TO ADEQUATELY SEDATE PT-TOLD WOULD NEED ANESTHESIA FOR FUTURE PROCEDURES  .  Pneumonia 09-2012  . Headache     HX OF MIGRAINES  . Arthritis     hands  . Hepatitis B   . Hepatitis C     Past Surgical History  Procedure Laterality Date  . Appendectomy    . Esophageal banding      FOR TX OF GI BLEED ABOUT 2008  . Arm debridement  08-11-12    right forearm wound with debridement 2 months ago/ right abdomen also-healing well  . Multiple tooth extractions  08/12/2012  . Colonoscopy with propofol  08/13/2012    Procedure: COLONOSCOPY WITH PROPOFOL;  Surgeon: Willis Modena, MD;  Location: [facility];  Service: Endoscopy;  Laterality: N/A;  . 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 albuterol (PROVENTIL HFA;VENTOLIN HFA) 108 (90 BASE) MCG/ACT inhaler Inhale 1-2 puffs into the lungs every 4 (four) hours as needed for wheezing.      Marland Kitchen amitriptyline (ELAVIL) 25 MG tablet Take 25 mg by mouth at bedtime.      Marland Kitchen omeprazole (PRILOSEC) 20 MG capsule Take 20 mg by mouth daily.      Marland Kitchen oxyCODONE (OXY IR/ROXICODONE) 5 MG immediate release tablet Take 5 mg by mouth every 4 (four) hours as needed for pain.      Marland Kitchen rOPINIRole (REQUIP) 3 MG tablet Take 3 mg by mouth at bedtime.        Allergies:  Allergies  Allergen Reactions  . Tylenol (  Acetaminophen) Other (See Comments)    Cirrhosis  . Morphine And Related Itching    itching  . Tramadol Nausea And Vomiting and Swelling    Abdominal swelling     History reviewed. No pertinent family history.  Social History:  reports that he has been smoking Cigarettes.  He has a 15 pack-year smoking history. He has never used smokeless tobacco. He reports that he does not drink alcohol or use illicit drugs.  Review of Systems: negative except as above   Blood pressure 116/79, pulse 70, temperature 98.5 F (36.9 C), temperature source Oral, resp. rate 22, height 6\' 4"  (1.93 m), weight 107.1 kg (236 lb 1.8 oz), SpO2 96.00%. Head:  Normocephalic, without obvious abnormality, atraumatic Neck: no adenopathy, no carotid bruit, no JVD, supple, symmetrical, trachea midline and thyroid not enlarged, symmetric, no tenderness/mass/nodules Resp: clear to auscultation bilaterally Cardio: regular rate and rhythm, S1, S2 normal, no murmur, click, rub or gallop GI: Abdomen soft slightly distended moderately tender in epigastrium no hepatosplenomegaly mass or guarding Extremities: extremities normal, atraumatic, no cyanosis or edema  Results for orders placed during the hospital encounter of 11/17/12 (from the past 48 hour(s))  CBC WITH DIFFERENTIAL     Status: Abnormal   Collection Time    11/17/12  6:54 PM      Result Value Range   WBC 7.4  4.0 - 10.5 K/uL   RBC 4.64  4.22 - 5.81 MIL/uL   Hemoglobin 14.5  13.0 - 17.0 g/dL   HCT 16.1  09.6 - 04.5 %   MCV 86.0  78.0 - 100.0 fL   MCH 31.3  26.0 - 34.0 pg   MCHC 36.3 (*) 30.0 - 36.0 g/dL   RDW 40.9  81.1 - 91.4 %   Platelets 89 (*) 150 - 400 K/uL   Comment: CONSISTENT WITH PREVIOUS RESULT   Neutrophils Relative 89 (*) 43 - 77 %   Neutro Abs 6.6  1.7 - 7.7 K/uL   Lymphocytes Relative 7 (*) 12 - 46 %   Lymphs Abs 0.5 (*) 0.7 - 4.0 K/uL   Monocytes Relative 4  3 - 12 %   Monocytes Absolute 0.3  0.1 - 1.0 K/uL   Eosinophils Relative 0  0 - 5 %   Eosinophils Absolute 0.0  0.0 - 0.7 K/uL   Basophils Relative 0  0 - 1 %   Basophils Absolute 0.0  0.0 - 0.1 K/uL  LIPASE, BLOOD     Status: None   Collection Time    11/17/12  6:54 PM      Result Value Range   Lipase 16  11 - 59 U/L  TYPE AND SCREEN     Status: None   Collection Time    11/17/12  6:55 PM      Result Value Range   ABO/RH(D) O POS     Antibody Screen NEG     Sample Expiration 11/20/2012    POCT I-STAT TROPONIN I     Status: None   Collection Time    11/17/12  7:12 PM      Result Value Range   Troponin i, poc 0.00  0.00 - 0.08 ng/mL   Comment 3            Comment: Due to the release kinetics of cTnI,     a  negative result within the first hours     of the onset of symptoms does not rule out     myocardial infarction  with certainty.     If myocardial infarction is still suspected,     repeat the test at appropriate intervals.  POCT I-STAT, CHEM 8     Status: None   Collection Time    11/17/12  7:13 PM      Result Value Range   Sodium 137  135 - 145 mEq/L   Potassium 3.9  3.5 - 5.1 mEq/L   Chloride 103  96 - 112 mEq/L   BUN 17  6 - 23 mg/dL   Creatinine, Ser 8.11  0.50 - 1.35 mg/dL   Glucose, Bld 99  70 - 99 mg/dL   Calcium, Ion 9.14  7.82 - 1.23 mmol/L   TCO2 25  0 - 100 mmol/L   Hemoglobin 14.3  13.0 - 17.0 g/dL   HCT 95.6  21.3 - 08.6 %  OCCULT BLOOD, POC DEVICE     Status: None   Collection Time    11/17/12  8:22 PM      Result Value Range   Fecal Occult Bld NEGATIVE  NEGATIVE  URINALYSIS, ROUTINE W REFLEX MICROSCOPIC     Status: Abnormal   Collection Time    11/17/12  8:54 PM      Result Value Range   Color, Urine ORANGE (*) YELLOW   Comment: BIOCHEMICALS MAY BE AFFECTED BY COLOR   APPearance CLEAR  CLEAR   Specific Gravity, Urine 1.031 (*) 1.005 - 1.030   pH 6.0  5.0 - 8.0   Glucose, UA NEGATIVE  NEGATIVE mg/dL   Hgb urine dipstick NEGATIVE  NEGATIVE   Bilirubin Urine MODERATE (*) NEGATIVE   Ketones, ur 15 (*) NEGATIVE mg/dL   Protein, ur NEGATIVE  NEGATIVE mg/dL   Urobilinogen, UA 2.0 (*) 0.0 - 1.0 mg/dL   Nitrite NEGATIVE  NEGATIVE   Leukocytes, UA TRACE (*) NEGATIVE  URINE MICROSCOPIC-ADD ON     Status: Abnormal   Collection Time    11/17/12  8:54 PM      Result Value Range   Squamous Epithelial / LPF RARE  RARE   WBC, UA 0-2  <3 WBC/hpf   Bacteria, UA RARE  RARE   Casts GRANULAR CAST (*) NEGATIVE   Comment: HYALINE CASTS  PROTIME-INR     Status: None   Collection Time    11/17/12  9:44 PM      Result Value Range   Prothrombin Time 15.0  11.6 - 15.2 seconds   INR 1.20  0.00 - 1.49  HEMOGLOBIN AND HEMATOCRIT, BLOOD     Status: Abnormal   Collection Time     11/18/12 12:58 AM      Result Value Range   Hemoglobin 13.1  13.0 - 17.0 g/dL   HCT 57.8 (*) 46.9 - 62.9 %  MRSA PCR SCREENING     Status: Abnormal   Collection Time    11/18/12  1:34 AM      Result Value Range   MRSA by PCR POSITIVE (*) NEGATIVE   Comment:            The GeneXpert MRSA Assay (FDA     approved for NASAL specimens     only), is one component of a     comprehensive MRSA colonization     surveillance program. It is not     intended to diagnose MRSA     infection nor to guide or     monitor treatment for     MRSA infections.     RESULT CALLED  TO, READ BACK BY AND VERIFIED WITH:     CALLED TO RN MATT YORK [phone]  THANEY  BASIC METABOLIC PANEL     Status: Abnormal   Collection Time    11/18/12  5:00 AM      Result Value Range   Sodium 136  135 - 145 mEq/L   Potassium 4.6  3.5 - 5.1 mEq/L   Chloride 107  96 - 112 mEq/L   CO2 19  19 - 32 mEq/L   Glucose, Bld 79  70 - 99 mg/dL   BUN 11  6 - 23 mg/dL   Creatinine, Ser 0.45  0.50 - 1.35 mg/dL   Calcium 8.2 (*) 8.4 - 10.5 mg/dL   GFR calc non Af Amer >90  >90 mL/min   GFR calc Af Amer >90  >90 mL/min   Comment:            The eGFR has been calculated     using the CKD EPI equation.     This calculation has not been     validated in all clinical     situations.     eGFR's persistently     <90 mL/min signify     possible Chronic Kidney Disease.  CBC     Status: Abnormal   Collection Time    11/18/12  5:00 AM      Result Value Range   WBC 3.5 (*) 4.0 - 10.5 K/uL   RBC 4.27  4.22 - 5.81 MIL/uL   Hemoglobin 13.0  13.0 - 17.0 g/dL   HCT 40.9 (*) 81.1 - 91.4 %   MCV 88.5  78.0 - 100.0 fL   MCH 30.4  26.0 - 34.0 pg   MCHC 34.4  30.0 - 36.0 g/dL   RDW 78.2  95.6 - 21.3 %   Platelets 61 (*) 150 - 400 K/uL   Comment: REPEATED TO VERIFY     SPECIMEN CHECKED FOR CLOTS     PLATELET COUNT CONFIRMED BY SMEAR     DELTA CHECK NOTED   No results found.  Assessment: Upper GI bleed probably either related to  esophageal varices or Mallory-Weiss tear versus peptic ulcer disease. Plan:  Will need EGD which cannot be done this morning but will be done later in the afternoon. May require anesthesia assistance. Airrion Otting C 11/18/2012, 8:06 AM

## 2012-11-18 NOTE — Op Note (Addendum)
[facility] [address] [place], [address]   ENDOSCOPY PROCEDURE REPORT  PATIENT: Mark, Christensen  MR#: [id number] BIRTHDATE: August 18, 1961 , 51  yrs. old GENDER: Male  ENDOSCOPIST: Charlott Rakes, MD REFERRED EX:[id number] team  PROCEDURE DATE:  11/18/2012 PROCEDURE:   EGD w/ band ligation of varices ASA CLASS:   Class III INDICATIONS:Hematemesis. MEDICATIONS: Fentanyl 75 mcg IV, Versed-Detailed 7.5 mg IV, Diphenhydramine (Benadryl) 25 mg IV, and Cetacaine spray x 2  TOPICAL ANESTHETIC:  DESCRIPTION OF PROCEDURE:   After the risks benefits and alternatives of the procedure were thoroughly explained, informed consent was obtained.  The Pentax Gastroscope [id number]  endoscope was introduced through the mouth and advanced to the second portion of the duodenum , limited by Without limitations.   The instrument was slowly withdrawn as the mucosa was fully examined.     FINDINGS: The endoscope was inserted into the oropharynx and esophagus was intubated.  There were large esophageal varices seen in mid and distal esophagus extending from 36 cm to 45 cm. A nipple sign was noted at 36 cm consistent with a recent variceal bleed. The gastroesophageal junction was noted to be 45 cm from the GEJ. Endoscope was advanced into the stomach, which revealed moderate portal hypertensive gastropathy (PHG) with scattered erosions and red spots without active bleeding.  The endoscope was advanced to the duodenal bulb and second portion of duodenum which were unremarkable.  The endoscope was withdrawn back into the stomach and retroflexion showed PHG and no gastric varices were seen. The endoscope was withdrawn and the band ligator was attached to the endoscope in the usual fashion. The endoscope was reinserted and advanced to the level of the nipple sign and during suctioning of this mucosa and placement of one band successfully blood came out of the part of the varix  that was banded at the site of the nipple sign. A second band was successfully deployed at this site and hemostasis was achieved. The area was watched closely and no further bleeding developed.  COMPLICATIONS: None  ENDOSCOPIC IMPRESSION:     1. Large esophageal varices with a small nipple sign consistent with recent bleeding - s/p EVBL X 2 successfully to same site 2. Portal hypertensive gastropathy 3. Gastric erosions  RECOMMENDATIONS: Octreotide infusion; sips of water and ice chips only; f/u H/Hs; IV PPI Q 12 hours   REPEAT EXAM: N/A  _______________________________ Charlott Rakes, MD eSigned:  Charlott Rakes, MD 11/18/2012 4:25 PM    CC:  PATIENT NAME:  Mark, Christensen MR#: [id number]

## 2012-11-19 ENCOUNTER — Inpatient Hospital Stay (HOSPITAL_COMMUNITY): Payer: Medicaid Other

## 2012-11-19 ENCOUNTER — Encounter (HOSPITAL_COMMUNITY): Payer: Self-pay | Admitting: Gastroenterology

## 2012-11-19 LAB — HEMOGLOBIN AND HEMATOCRIT, BLOOD
HCT: 40.8 % (ref 39.0–52.0)
HCT: 40.9 % (ref 39.0–52.0)
Hemoglobin: 14.3 g/dL (ref 13.0–17.0)

## 2012-11-19 LAB — TROPONIN I
Troponin I: <0.3 ng/mL (ref <0.30)
Troponin I: <0.3 ng/mL (ref <0.30)

## 2012-11-19 NOTE — Progress Notes (Signed)
@  2030 pt endorsed severe chest and left arm pain stating "I feel like I've been kicked by a horse". STAT EKG performed and on-call for [facility] paged (K. Schorr). EKG was negative (NSR). Serial Cardiac Enzymes ordered and 1mg  of Dilaudid Administered. L AC IV d/c'd due to infiltration. L Arm pain resolved and chest pain became much less severe. Will continue to monitor and assess.

## 2012-11-19 NOTE — Progress Notes (Signed)
Lt arm swollen and red with raised area center. Patient states he had IV in arm which infiltrated.  Heat and cold applied. Peri area red. Cont. To watch

## 2012-11-19 NOTE — Progress Notes (Signed)
[facility] Progress Note  Subjective: It is in patient had chest discomfort last night after banding, worse with inspiration, not so much in swallowing. He does better this morning. Cardiac enzymes pending  Objective: Vital signs in last 24 hours: Temp:  [97.6 F (36.4 C)-98.2 F (36.8 C)] 98.2 F (36.8 C) (02/26 0900) Pulse Rate:  [66-100] 100 (02/26 0415) Resp:  [8-28] 20 (02/26 0415) BP: (106-146)/(67-121) 116/73 mmHg (02/26 0415) SpO2:  [93 %-100 %] 95 % (02/26 0415) Weight change:    PE: Abdomen soft nondistended patient alert and oriented good spirits  Lab Results: Results for orders placed during the hospital encounter of 11/17/12 (from the past 24 hour(s))  HEMOGLOBIN AND HEMATOCRIT, BLOOD     Status: None   Collection Time    11/18/12  5:48 PM      Result Value Range   Hemoglobin 14.8  13.0 - 17.0 g/dL   HCT 41.3  24.4 - 01.0 %  CK TOTAL AND CKMB     Status: None   Collection Time    11/18/12  9:40 PM      Result Value Range   Total CK 112  7 - 232 U/L   CK, MB 2.6  0.3 - 4.0 ng/mL   Relative Index 2.3  0.0 - 2.5  TROPONIN I     Status: None   Collection Time    11/18/12  9:40 PM      Result Value Range   Troponin I <0.30  <0.30 ng/mL  HEMOGLOBIN AND HEMATOCRIT, BLOOD     Status: None   Collection Time    11/19/12 12:50 AM      Result Value Range   Hemoglobin 14.3  13.0 - 17.0 g/dL   HCT 27.2  53.6 - 64.4 %  TROPONIN I     Status: None   Collection Time    11/19/12  3:06 AM      Result Value Range   Troponin I <0.30  <0.30 ng/mL  TROPONIN I     Status: None   Collection Time    11/19/12  8:39 AM      Result Value Range   Troponin I <0.30  <0.30 ng/mL    Studies/Results: Dg Chest Port 1 View  11/19/2012  *RADIOLOGY REPORT*  Clinical Data: Chest pain  PORTABLE CHEST - 1 VIEW  Comparison: None.  Findings:  Heart size is normal.  There is no pleural effusion or edema.  No airspace consolidation identified.  Review of the visualized osseous  structures is unremarkable.  IMPRESSION:  1.  No acute cardiopulmonary abnormalities.   Original Report Authenticated By: Signa Kell, M.D.       Assessment: Status post GI bleed from esophageal varices, status post esophageal banding  Plan: Abstinence from alcohol. Observe for further bleeding. We'll advance to full liquid diet.    Tikita Mabee C 11/19/2012, 10:21 AM

## 2012-11-19 NOTE — Progress Notes (Signed)
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 Previous esophageal banding 4 years prior.  Presented to ER with sudden onset of vomiting clotted blood. Initially bright red blood then progressed to coffee-ground material. Has chronic epigastric abdominal pain without any change in quality of this pain from baseline. Denied melena or bright blood.  No recent fevers or abdominal distention. Endorsed not actively drinking alcohol. Hemoglobin at presentation was 14.3. He was also hemodynamically stable.  Assessment/Plan:  Hematemesis/known esophageal varices -Appreciate GI assistance -Status post EGD with banding of varices x2 -Completed octreotide -continue IV Protonix  -Hemoglobin stable- follow to determine if develops acute blood loss anemia-dc serial H/H in favor of am CBC -GI has advanced to full liquid diet  Hepatitis C/Hepatitis B/Cirrhosis of liver -According to the GI note patient was last evaluated outpatient followup Feb. 2014. Laboratory data revealed no signs of active hepatitis B or hepatitis C and no prior evidence of hepatitis A. -complete abstinence from ETOH per GI!  Non-cardiac,GI Chest Pain -likely due to recent banding -EKG/enzymes have been negative  Hypertension -Blood pressure currently controlled-not on medications prior to admission  Abdominal pain -Chronic and no clear-cut etiology has yet to be determined  DVT prophylaxis: SCDs Code Status: Full Family Communication: Patient and wife Disposition Plan: Transfer to floor  Consultants: Gastroenterology  Procedures:  None  Antibiotics: None  HPI/Subjective: Patient continues with same epigastric abdominal pain which is chronic. No further hematemesis or coffee-ground emesis. Reports CP since banding  procedure w/o assoc sx's (diaphoesis,nausea, radiation, exertional sx's)  Objective: Blood pressure 116/73, pulse 100, temperature 98.9 F (37.2 C), temperature source Oral, resp. rate 20, height 6\' 4"  (1.93 m), weight 107.1 kg (236 lb 1.8 oz), SpO2 95.00%.  Intake/Output Summary (Last 24 hours) at 11/19/12 1414 Last data filed at 11/19/12 1158  Gross per 24 hour  Intake    275 ml  Output   3575 ml  Net  -3300 ml   Exam: General: No acute respiratory distress Lungs: Clear to auscultation bilaterally without wheezes or crackles Cardiovascular: Regular rate and rhythm without murmur gallop or rub normal S1 and S2 Abdomen: Nontender, nondistended, soft, bowel sounds positive, no rebound, no ascites, no appreciable mass Extremities: No significant cyanosis, clubbing, or edema bilateral lower extremities  Data Reviewed: Basic Metabolic Panel:  Recent Labs Lab 11/17/12 1913 11/18/12 0500  NA 137 136  K 3.9 4.6  CL 103 107  CO2  --  19  GLUCOSE 99 79  BUN 17 11  CREATININE 1.10 0.82  CALCIUM  --  8.2*    Recent Labs Lab 11/17/12 1854  LIPASE 16   CBC:  Recent Labs Lab 11/17/12 1854  11/18/12 0500 11/18/12 0840 11/18/12 1748 11/19/12 0050 11/19/12 0839  WBC 7.4  --  3.5*  --   --   --   --   NEUTROABS 6.6  --   --   --   --   --   --   HGB 14.5  < > 13.0 12.4* 14.8 14.3 14.3  HCT 39.9  < > 37.8* 35.7* 41.9 40.8 40.9  MCV 86.0  --  88.5  --   --   --   --   PLT 89*  --  61*  --   --   --   --   < > =  values in this interval not displayed. Cardiac Enzymes:  Recent Labs Lab 11/18/12 2140 11/19/12 0306 11/19/12 0839  CKTOTAL 112  --   --   CKMB 2.6  --   --   TROPONINI <0.30 <0.30 <0.30    Recent Results (from the past 240 hour(s))  MRSA PCR SCREENING     Status: Abnormal   Collection Time    11/18/12  1:34 AM      Result Value Range Status   MRSA by PCR POSITIVE (*) NEGATIVE Final   Comment:            The GeneXpert MRSA Assay (FDA     approved for  NASAL specimens     only), is one component of a     comprehensive MRSA colonization     surveillance program. It is not     intended to diagnose MRSA     infection nor to guide or     monitor treatment for     MRSA infections.     RESULT CALLED TO, READ BACK BY AND VERIFIED WITH:     CALLED TO RN MATT YORK [phone] @0623  [facility]     Studies:  Recent x-ray studies have been reviewed in detail by the Attending Physician  Scheduled Meds:  Reviewed in detail by the Attending Physician   Junious Silk, ANP [facility] Office  [phone] Pager [phone]  On-Call/Text Page:      [email or web address]      password [email or web address]  If 7PM-7AM, please contact night-coverage [email or web address] Password [email or web address] 11/19/2012, 2:14 PM   LOS: 2 days   I have personally examined this patient and reviewed the entire database. I have reviewed the above note, made any necessary editorial changes, and agree with its content.  Lonia Blood, MD [facility]

## 2012-11-19 NOTE — Progress Notes (Signed)
Event: 2055: Notified by RN that pt has had persistent c/o CP w/ radiation into (L) arm. EKG was done and was w/o acute changes. NP to bedside. Subjective: Pt reports he has had CP since he awoke after his EGD this afternoon. States pain is worse w/ deep inspiration but denies SOB. Denies nausea dizziness or other associated sx's. Ain currently 2/10. Admits to (L) arm pain which has improved since IV site changed.  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 Previous esophageal banding 4 years prior. Presented to ER on 11/17/2012 with sudden onset of vomiting clotted blood. Initially bright red blood then progressed to coffee-ground material. He has chronic epigastric abdominal pain without any change in quality of this pain from baseline. No recent fevers or abdominal distention. States he is not actively drinking alcohol. Hemoglobin at presentation was 14.3. He was also hemodynamically stable. At bedside pt noted to be sleeping in no acute distress. Arouses easily and is alert and oriented. BBS CTA. Current VSS. EKG is w/o acute changes and is comparable to EKG from 08/2013. First set of CE negative. EGD note indicates pt found to have large esophageal varices that were successfully banded x 2. PCXR pending. Assessment/Plan: 1. Chest pain: Etiology unclear though suspect related to pt's chronic abd/epigastric pain and EGD today. (L) arm pain felt to be related to IV infiltration to (L) forearm as pain resolved once IV d/c'd. EKG w/o ST elevations or depression, first set of cardiac enzymes negative. Though low index of suspicion for ACS will continue to cycle enzymes. Will f/u CXR and continue to monitor closely.  Leanne Chang, NP-C [facility] Pager [phone]

## 2012-11-19 NOTE — Progress Notes (Signed)
Pt transferred from 3300. Pt is alert and oriented. Pt oriented to room, staff, and call bell. Bed in lowest position. Full assessment to Epic. Call bell with in reach. Told to call for assists. Will continue to monitor.  Caydin Yeatts E

## 2012-11-20 LAB — CBC WITH DIFFERENTIAL/PLATELET
Eosinophils Absolute: 0 10*3/uL (ref 0.0–0.7)
Hemoglobin: 13.4 g/dL (ref 13.0–17.0)
Lymphocytes Relative: 20 % (ref 12–46)
Lymphs Abs: 1.1 10*3/uL (ref 0.7–4.0)
MCH: 31 pg (ref 26.0–34.0)
MCV: 85.6 fL (ref 78.0–100.0)
Monocytes Relative: 13 % — ABNORMAL HIGH (ref 3–12)
Neutrophils Relative %: 64 % (ref 43–77)
RBC: 4.32 MIL/uL (ref 4.22–5.81)
WBC: 5.6 10*3/uL (ref 4.0–10.5)

## 2012-11-20 MED ORDER — PANTOPRAZOLE SODIUM 40 MG PO TBEC: 40.0000 mg | DELAYED_RELEASE_TABLET | Freq: Every day | ORAL | Status: DC

## 2012-11-20 MED ORDER — PANTOPRAZOLE SODIUM 40 MG PO TBEC
40.0000 mg | DELAYED_RELEASE_TABLET | Freq: Every day | ORAL | Status: DC
  Filled 2012-11-20: qty 1

## 2012-11-20 MED ORDER — DOXYCYCLINE HYCLATE 100 MG PO TABS: 100.0000 mg | ORAL_TABLET | Freq: Two times a day (BID) | ORAL | Status: DC

## 2012-11-20 MED ORDER — OXYCODONE HCL 5 MG PO TABS: 5.0000 mg | ORAL_TABLET | Freq: Four times a day (QID) | ORAL | Status: DC | PRN

## 2012-11-20 MED ORDER — DOXYCYCLINE HYCLATE 100 MG PO TABS
100.0000 mg | ORAL_TABLET | Freq: Two times a day (BID) | ORAL | Status: DC
  Administered 2012-11-20: 100 mg via ORAL
  Filled 2012-11-20 (×2): qty 1

## 2012-11-20 MED ORDER — OXYCODONE-ACETAMINOPHEN 5-325 MG PO TABS
2.0000 | ORAL_TABLET | Freq: Once | ORAL | Status: DC
  Filled 2012-11-20: qty 2

## 2012-11-20 NOTE — Progress Notes (Signed)
Pt complaining of pain. IV infiltrated. On call MD notified. New orders for PO pain med given. Waiting on IV team to start a new IV.

## 2012-11-20 NOTE — Care Management Note (Signed)
    Page 1 of 1   11/20/2012     3:42:26 PM   CARE MANAGEMENT NOTE 11/20/2012  Patient:  Mark Christensen, Mark Christensen   Account Number:  [id number]  Date Initiated:  11/20/2012  Documentation initiated by:  Letha Cape  Subjective/Objective Assessment:   dx hematemesis/vomiting  admit- lives with spouse.  pta indep.     Action/Plan:   Anticipated DC Date:  11/20/2012   Anticipated DC Plan:  HOME/SELF CARE      DC Planning Services  CM consult      Choice offered to / List presented to:             Status of service:  Completed, signed off Medicare Important Message given?   (If response is "NO", the following Medicare IM given date fields will be blank) Date Medicare IM given:   Date Additional Medicare IM given:    Discharge Disposition:  HOME/SELF CARE  Per UR Regulation:  Reviewed for med. necessity/level of care/duration of stay  If discussed at Long Length of Stay Meetings, dates discussed:    Comments:  11/20/12 15:41 Letha Cape RN, BSN [phone] patient lives with spouse, pta indep.  Patient for dc today, no needs anticipated.

## 2012-11-20 NOTE — Discharge Summary (Signed)
PATIENT DETAILS Name: Mark Christensen Age: 52 y.o. Sex: male Date of Birth: 1961/03/07 MRN: [id number]. Admit Date: 11/17/2012 Admitting Physician: Tarry Kos, MD WUJ:[id number], Leonette Most, MD  Recommendations for Outpatient Follow-up:  Monitor site for thrombophlebitis at next visit  PRIMARY DISCHARGE DIAGNOSIS:  Principal Problem:   Hematemesis/vomiting blood Active Problems:   Hepatitis C   Hepatitis B   Cirrhosis of liver   Hypertension   Abdominal pain      PAST MEDICAL HISTORY: Past Medical History  Diagnosis Date  . Hepatic cirrhosis   . Hypertension     NO LONGER TAKING B/P MEDS--STATES DISCONTINUED BY HIS DOCTOR  . Anxiety   . Shortness of breath   . GERD (gastroesophageal reflux disease)   . Restless leg syndrome   . Complication of anesthesia     ENDO/COLONOSCOPY ATTEMPTED IN THE PAST-UNABLE TO ADEQUATELY SEDATE PT-TOLD WOULD NEED ANESTHESIA FOR FUTURE PROCEDURES  . Pneumonia 09-2012  . Headache     HX OF MIGRAINES  . Arthritis     hands  . Hepatitis B   . Hepatitis C     DISCHARGE MEDICATIONS:   Medication List    STOP taking these medications       omeprazole 20 MG capsule  Commonly known as:  PRILOSEC      TAKE these medications       albuterol 108 (90 BASE) MCG/ACT inhaler  Commonly known as:  PROVENTIL HFA;VENTOLIN HFA  Inhale 1-2 puffs into the lungs every 4 (four) hours as needed for wheezing.     amitriptyline 25 MG tablet  Commonly known as:  ELAVIL  Take 25 mg by mouth at bedtime.     doxycycline 100 MG tablet  Commonly known as:  VIBRA-TABS  Take 1 tablet (100 mg total) by mouth every 12 (twelve) hours.     oxyCODONE 5 MG immediate release tablet  Commonly known as:  Oxy IR/ROXICODONE  Take 1 tablet (5 mg total) by mouth every 6 (six) hours as needed for pain.     pantoprazole 40 MG tablet  Commonly known as:  PROTONIX  Take 1 tablet (40 mg total) by mouth daily at 12 noon.     rOPINIRole 3 MG tablet  Commonly known as:   REQUIP  Take 3 mg by mouth at bedtime.         BRIEF HPI:  See H&P, Labs, Consult and Test reports for all details in brief,52 yo male h/o etoh cirrhosis, hep b/c, esophageal varices s/p banding about 4 years ago comes in with sudden onset of vomiting blood clots. Started off with vomiting several blood clots, then several episodes of bright red blood then progressed to coffee ground material. Has chronic abd pain and this is no different but has pain in his ruq/epi area.   CONSULTATIONS:   GI  PERTINENT RADIOLOGIC STUDIES: Dg Chest 2 View  11/06/2012  *RADIOLOGY REPORT*  Clinical Data: Sob; cough; fever; smoker; htn  CHEST - 2 VIEW  Comparison: 10/26/2012  Findings: Cardiac and mediastinal contours appear normal.  The lungs appear clear.  No pleural effusion is identified.  IMPRESSION:  No significant abnormality identified.   Original Report Authenticated By: Gaylyn Rong, M.D.    Dg Chest 2 View  10/26/2012  *RADIOLOGY REPORT*  Clinical Data: Fever, shortness of breath.  CHEST - 2 VIEW  Comparison: 05/06/2012  Findings: Small bilateral pleural effusions.  Patchy atelectasis versus early infiltrates posteriorly in both lower lobes, left greater than right.  Heart size remains  normal.  No overt interstitial edema.  Regional bones unremarkable.  IMPRESSION:  Small effusions with adjacent atelectasis/infiltrate posteriorly in the lower lobes.   Original Report Authenticated By: D. Andria Rhein, MD    US Abdomen Complete  11/11/2012   *RADIOLOGY REPORT*  Abdominal ultrasound  History:  Hepatic cirrhosis  Comparison:  Abdominal MRI August 07, 2012  Findings:  Gallbladder is somewhat contracted.  There is sludge in the gallbladder.  There are no echogenic foci which move and shadow as is expected with gallstones.  Gallbladder wall is not appreciably thickened given the somewhat contracted state.  There is no pericholecystic fluid.  There is no intrahepatic, common hepatic, or common bile duct  dilatation.  Pancreas is partially obscured by gas.  Visualized portions of pancreas appear normal.  Liver is enlarged measuring 18.0 cm in length.  The echotexture of the liver is somewhat increased consistent with underlying parenchymal disease.  The liver contour is irregular consistent with known cirrhosis.  No focal liver masses are appreciated on this study.  The spleen is enlarged. Spleen measures 17.4 x 15.7 x 8.0 cm with a splenic volume of 1,093 cubic centimeters. No focal splenic lesions are identified.  There is a cyst in the medial upper pole left kidney measuring 1.7 x 1.7 x 1.1 cm in size.  Kidneys bilaterally otherwise appear normal.  There is no ascites.  Aorta is nonaneurysmal.  Inferior vena cava appears patent.  Conclusion:  There is enlargement of the liver with irregular contour and increased echogenicity consistent with cirrhosis.  No liver masses are appreciated on this study.  It must be cautioned that the sensitivity of ultrasound for more subtle liver lesions is somewhat diminished given the increased echogenicity associated with the underlying parenchymal disease.  There is splenomegaly.  There is a cyst in the upper pole of the left kidney.  Pancreatic visualization is limited due to overlying gas.  Gallbladder is somewhat contracted and contains a degree of sludge. No gallstones seen. No gallbladder wall edema or fluid.   Original Report Authenticated By: Bretta Bang, M.D.    Dg Chest Port 1 View  11/19/2012  *RADIOLOGY REPORT*  Clinical Data: Chest pain  PORTABLE CHEST - 1 VIEW  Comparison: None.  Findings:  Heart size is normal.  There is no pleural effusion or edema.  No airspace consolidation identified.  Review of the visualized osseous structures is unremarkable.  IMPRESSION:  1.  No acute cardiopulmonary abnormalities.   Original Report Authenticated By: Signa Kell, M.D.      PERTINENT LAB RESULTS: CBC:  Recent Labs  11/18/12 0500  11/19/12 0839  11/20/12 0037  WBC 3.5*  --   --  5.6  HGB 13.0  < > 14.3 13.4  HCT 37.8*  < > 40.9 37.0*  PLT 61*  --   --  77*  < > = values in this interval not displayed. CMET CMP     Component Value Date/Time   NA 136 11/18/2012 0500   K 4.6 11/18/2012 0500   CL 107 11/18/2012 0500   CO2 19 11/18/2012 0500   GLUCOSE 79 11/18/2012 0500   BUN 11 11/18/2012 0500   CREATININE 0.82 11/18/2012 0500   CALCIUM 8.2* 11/18/2012 0500   PROT 8.2 11/06/2012 2238   ALBUMIN 3.3* 11/06/2012 2238   AST 78* 11/06/2012 2238   ALT 73* 11/06/2012 2238   ALKPHOS 142* 11/06/2012 2238   BILITOT 0.9 11/06/2012 2238   GFRNONAA >90 11/18/2012 0500   GFRAA >  90 11/18/2012 0500    GFR Estimated Creatinine Clearance: 133.8 ml/min (by C-G formula based on Cr of 0.82).  Recent Labs  11/17/12 1854  LIPASE 16    Recent Labs  11/18/12 2140 11/19/12 0306 11/19/12 0839  CKTOTAL 112  --   --   CKMB 2.6  --   --   TROPONINI <0.30 <0.30 <0.30   No components found with this basename: POCBNP,  No results found for this basename: DDIMER,  in the last 72 hours No results found for this basename: HGBA1C,  in the last 72 hours No results found for this basename: CHOL, HDL, LDLCALC, TRIG, CHOLHDL, LDLDIRECT,  in the last 72 hours No results found for this basename: TSH, T4TOTAL, FREET3, T3FREE, THYROIDAB,  in the last 72 hours No results found for this basename: VITAMINB12, FOLATE, FERRITIN, TIBC, IRON, RETICCTPCT,  in the last 72 hours Coags:  Recent Labs  11/17/12 2144 11/18/12 0630  INR 1.20 1.22   Microbiology: Recent Results (from the past 240 hour(s))  MRSA PCR SCREENING     Status: Abnormal   Collection Time    11/18/12  1:34 AM      Result Value Range Status   MRSA by PCR POSITIVE (*) NEGATIVE Final   Comment:            The GeneXpert MRSA Assay (FDA     approved for NASAL specimens     only), is one component of a     comprehensive MRSA colonization     surveillance program. It is not     intended to  diagnose MRSA     infection nor to guide or     monitor treatment for     MRSA infections.     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 Previous esophageal banding 4 years prior. Presented to ER with sudden onset of vomiting clotted blood. Initially bright red blood then progressed to coffee-ground material.Patient was placed on Protonix and Octreotide infusion -seen by [name] GI-EGD done on 2/25-banding of varices x2 -Hb was stable, did not require PRBC transfusion. Diet was slowly advanced, he has now tolerated a dys 3 diet, and has had no further hematemesis or melena and is stable to be discharged home today.He has been instructed to stay on a soft mech diet for the next few weeks,  Left Elbow area thrombophlebitis-antecubital fossa area -this developed at the prior IV site, briefly curbsided CCS given small fluctuant area at the site that started spontaneously draining, they saw the patient and recommended no I&D for now as it already draining and very small. Will place on empiric Doxycycline for 1 week. I have asked the patient to return to the ED ASAP if this area were to be more erythematous, swelling were to worsen or he developed severe pain or fever. Both patient and wife are agreeable to this plan, and they will follow up with their PCP next week to have him check the left elbow area as well.  Hepatitis C/Hepatitis B/Cirrhosis of liver -patient follow with Dr Dulce Sellar of [facility] will call and reschedule a follow up appointment  Abdominal pain  -Chronic and no clear-cut etiology has yet to be determined -can follow with GI as outpatient  TODAY-DAY  OF DISCHARGE:  Subjective:   Konrad Hoak today has no headache,no chest abdominal pain,no new weakness tingling or numbness,  feels much better wants to go home today. He has tolerated a regular diet and is requesting he be discharged today  Objective:   Blood pressure 109/63, pulse 94, temperature 98.4 F (36.9 C), temperature source Oral, resp. rate 18, height 6\' 4"  (1.93 m), weight 97.2 kg (214 lb 4.6 oz), SpO2 93.00%.  Intake/Output Summary (Last 24 hours) at 11/20/12 1314 Last data filed at 11/19/12 2137  Gross per 24 hour  Intake    560 ml  Output   1200 ml  Net   -640 ml    Exam Awake Alert, Oriented *3, No new F.N deficits, Normal affect [place].AT,PERRAL Supple Neck,No JVD, No cervical lymphadenopathy appriciated.  Symmetrical Chest wall movement, Good air movement bilaterally, CTAB RRR,No Gallops,Rubs or new Murmurs, No Parasternal Heave +ve B.Sounds, Abd Soft, Non tender, No organomegaly appriciated, No rebound -guarding or rigidity. No Cyanosis, Clubbing or edema, No new Rash or bruise  DISCHARGE CONDITION: Stable  DISPOSITION: HOME  DISCHARGE INSTRUCTIONS:    Activity:  As tolerated   Diet recommendation: Dysphagia 3 diet  Follow-up Information   Follow up with Jeri Cos, MD. Schedule an appointment as soon as possible for a visit in 5 days.   Contact information:   [address] [place] [address] [phone]       Follow up with Freddy Jaksch, MD. Schedule an appointment as soon as possible for a visit in 2 weeks.   Contact information:   [address] [place] [address] [phone]      Total Time spent on discharge equals 45 minutes.  SignedJeoffrey Massed 11/20/2012 1:14 PM

## 2012-11-20 NOTE — Progress Notes (Signed)
[facility] Progress Note  Subjective: N complaints. One green stool last night. Wants to eat.  Objective: Vital signs in last 24 hours: Temp:  [98.4 F (36.9 C)-100.1 F (37.8 C)] 98.4 F (36.9 C) (02/27 0452) Pulse Rate:  [81-94] 94 (02/27 0452) Resp:  [15-18] 18 (02/27 0452) BP: (109-130)/(63-91) 109/63 mmHg (02/27 0452) SpO2:  [92 %-99 %] 93 % (02/27 0452) Weight:  [97.2 kg (214 lb 4.6 oz)] 97.2 kg (214 lb 4.6 oz) (02/26 2135) Weight change:    YQ:MVHQIONGE  Lab Results: Results for orders placed during the hospital encounter of 11/17/12 (from the past 24 hour(s))  CBC WITH DIFFERENTIAL     Status: Abnormal   Collection Time    11/20/12 12:37 AM      Result Value Range   WBC 5.6  4.0 - 10.5 K/uL   RBC 4.32  4.22 - 5.81 MIL/uL   Hemoglobin 13.4  13.0 - 17.0 g/dL   HCT 95.2 (*) 84.1 - 32.4 %   MCV 85.6  78.0 - 100.0 fL   MCH 31.0  26.0 - 34.0 pg   MCHC 36.2 (*) 30.0 - 36.0 g/dL   RDW 40.1  02.7 - 25.3 %   Platelets 77 (*) 150 - 400 K/uL   Neutrophils Relative 64  43 - 77 %   Neutro Abs 3.5  1.7 - 7.7 K/uL   Lymphocytes Relative 20  12 - 46 %   Lymphs Abs 1.1  0.7 - 4.0 K/uL   Monocytes Relative 13 (*) 3 - 12 %   Monocytes Absolute 0.7  0.1 - 1.0 K/uL   Eosinophils Relative 1  0 - 5 %   Eosinophils Absolute 0.0  0.0 - 0.7 K/uL   Basophils Relative 3 (*) 0 - 1 %   Basophils Absolute 0.1  0.0 - 0.1 K/uL    Studies/Results: Dg Chest Port 1 View  11/19/2012  *RADIOLOGY REPORT*  Clinical Data: Chest pain  PORTABLE CHEST - 1 VIEW  Comparison: None.  Findings:  Heart size is normal.  There is no pleural effusion or edema.  No airspace consolidation identified.  Review of the visualized osseous structures is unremarkable.  IMPRESSION:  1.  No acute cardiopulmonary abnormalities.   Original Report Authenticated By: Signa Kell, M.D.       Assessment: Variceal bleed sp banding. Hemodynamically stable.  Plan: Advance diet. Monitor stools and HB. Possibly  home with fu with Dr Dulce Sellar this PM or AM    Rodd Heft C 11/20/2012, 9:01 AM

## 2012-11-21 NOTE — ED Provider Notes (Signed)
Medical screening examination/treatment/procedure(s) were conducted as a shared visit with non-physician practitioner(s) and myself.  I personally evaluated the patient during the encounter Pt with hx hepc, former hx etoh abuse, gi bleeding/esophageal varices c/o coffeeground/bloody emesis. Heme pos stools. abd soft nt. Discussed w gi. protonix and octreotide iv, admit to med service.  Suzi Roots, MD 11/21/12 1030

## 2012-11-25 ENCOUNTER — Encounter (HOSPITAL_COMMUNITY): Payer: Self-pay | Admitting: *Deleted

## 2012-11-25 ENCOUNTER — Emergency Department (HOSPITAL_COMMUNITY)
Admission: EM | Admit: 2012-11-25 | Discharge: 2012-11-25 | Disposition: A | Discharge status: Home / Self Care | Payer: Medicaid Other | Attending: Emergency Medicine | Admitting: Emergency Medicine

## 2012-11-25 DIAGNOSIS — F17200 Nicotine dependence, unspecified, uncomplicated: Secondary | ICD-10-CM | POA: Insufficient documentation

## 2012-11-25 DIAGNOSIS — I10 Essential (primary) hypertension: Secondary | ICD-10-CM | POA: Insufficient documentation

## 2012-11-25 DIAGNOSIS — Z8669 Personal history of other diseases of the nervous system and sense organs: Secondary | ICD-10-CM | POA: Insufficient documentation

## 2012-11-25 DIAGNOSIS — G8929 Other chronic pain: Secondary | ICD-10-CM | POA: Insufficient documentation

## 2012-11-25 DIAGNOSIS — Z79899 Other long term (current) drug therapy: Secondary | ICD-10-CM | POA: Insufficient documentation

## 2012-11-25 DIAGNOSIS — Z8739 Personal history of other diseases of the musculoskeletal system and connective tissue: Secondary | ICD-10-CM | POA: Insufficient documentation

## 2012-11-25 DIAGNOSIS — Z8659 Personal history of other mental and behavioral disorders: Secondary | ICD-10-CM | POA: Insufficient documentation

## 2012-11-25 DIAGNOSIS — Z8619 Personal history of other infectious and parasitic diseases: Secondary | ICD-10-CM | POA: Insufficient documentation

## 2012-11-25 DIAGNOSIS — R112 Nausea with vomiting, unspecified: Secondary | ICD-10-CM

## 2012-11-25 DIAGNOSIS — Z8701 Personal history of pneumonia (recurrent): Secondary | ICD-10-CM | POA: Insufficient documentation

## 2012-11-25 DIAGNOSIS — Z8719 Personal history of other diseases of the digestive system: Secondary | ICD-10-CM | POA: Insufficient documentation

## 2012-11-25 DIAGNOSIS — R1011 Right upper quadrant pain: Secondary | ICD-10-CM | POA: Insufficient documentation

## 2012-11-25 LAB — COMPREHENSIVE METABOLIC PANEL
ALT: 25 U/L (ref 0–53)
Alkaline Phosphatase: 99 U/L (ref 39–117)
BUN: 11 mg/dL (ref 6–23)
CO2: 24 mEq/L (ref 19–32)
GFR calc Af Amer: >90 mL/min (ref >90)
GFR calc non Af Amer: >90 mL/min (ref >90)
Glucose, Bld: 92 mg/dL (ref 70–99)
Potassium: 3.9 mEq/L (ref 3.5–5.1)
Sodium: 134 mEq/L — ABNORMAL LOW (ref 135–145)
Total Bilirubin: 0.5 mg/dL (ref 0.3–1.2)
Total Protein: 8.3 g/dL (ref 6.0–8.3)

## 2012-11-25 LAB — CBC WITH DIFFERENTIAL/PLATELET
Basophils Absolute: 0 10*3/uL (ref 0.0–0.1)
Basophils Relative: 0 % (ref 0–1)
Eosinophils Relative: 2 % (ref 0–5)
Lymphocytes Relative: 28 % (ref 12–46)
MCHC: 35.3 g/dL (ref 30.0–36.0)
MCV: 87 fL (ref 78.0–100.0)
Platelets: 99 10*3/uL — ABNORMAL LOW (ref 150–400)
RDW: 14.3 % (ref 11.5–15.5)
WBC: 4.6 10*3/uL (ref 4.0–10.5)

## 2012-11-25 LAB — LIPASE, BLOOD: Lipase: 41 U/L (ref 11–59)

## 2012-11-25 MED ORDER — HYDROMORPHONE HCL PF 1 MG/ML IJ SOLN
1.0000 mg | Freq: Once | INTRAMUSCULAR | Status: AC
  Administered 2012-11-25: 1 mg via INTRAVENOUS
  Filled 2012-11-25: qty 1

## 2012-11-25 MED ORDER — OXYCODONE HCL 5 MG PO TABS: 5.0000 mg | ORAL_TABLET | Freq: Four times a day (QID) | ORAL | Status: DC | PRN

## 2012-11-25 MED ORDER — ONDANSETRON HCL 4 MG/2ML IJ SOLN
4.0000 mg | Freq: Once | INTRAMUSCULAR | Status: AC
  Administered 2012-11-25: 4 mg via INTRAVENOUS
  Filled 2012-11-25: qty 2

## 2012-11-25 MED ORDER — ONDANSETRON HCL 4 MG PO TABS: 4.0000 mg | ORAL_TABLET | Freq: Three times a day (TID) | ORAL | Status: DC | PRN

## 2012-11-25 MED ORDER — SODIUM CHLORIDE 0.9 % IV BOLUS (SEPSIS)
1000.0000 mL | INTRAVENOUS | Status: AC
  Administered 2012-11-25: 1000 mL via INTRAVENOUS

## 2012-11-25 NOTE — ED Notes (Signed)
Pt reports pain still persists despite adm of dilaudid - Dr. Freida Busman made aware of same

## 2012-11-25 NOTE — ED Notes (Signed)
Pt c/o RUQ pain for some time that is increasing.  He was just d/c'd from [facility] after being tx for esophageal variciese.  Pt states unable to tolerate po since last night.

## 2012-11-25 NOTE — ED Provider Notes (Signed)
I saw and evaluated the patient, reviewed the resident's note and I agree with the findings and plan.  Patient's chronic abdominal pain and laboratory studies showed no acute process. Will follow with his Dr. as needed  Toy Baker, MD 11/25/12 [phone]

## 2012-11-26 ENCOUNTER — Ambulatory Visit (HOSPITAL_COMMUNITY): Admission: RE | Admit: 2012-11-26 | Payer: Medicaid Other | Source: Ambulatory Visit | Admitting: Gastroenterology

## 2012-11-26 HISTORY — DX: Pneumonia, unspecified organism: J18.9

## 2012-11-26 SURGERY — ESOPHAGOGASTRODUODENOSCOPY (EGD) WITH PROPOFOL
Anesthesia: Monitor Anesthesia Care

## 2012-12-13 ENCOUNTER — Inpatient Hospital Stay (HOSPITAL_COMMUNITY)
Admission: EM | Admit: 2012-12-13 | Discharge: 2012-12-16 | DRG: 493 | Disposition: A | Discharge status: Home Health | Payer: Medicaid Other | Attending: Orthopedic Surgery | Admitting: Orthopedic Surgery

## 2012-12-13 ENCOUNTER — Encounter (HOSPITAL_COMMUNITY)
Admission: EM | Disposition: A | Discharge status: Home Health | Payer: Self-pay | Source: Home / Self Care | Attending: Orthopedic Surgery

## 2012-12-13 ENCOUNTER — Emergency Department (HOSPITAL_COMMUNITY): Payer: Medicaid Other

## 2012-12-13 ENCOUNTER — Emergency Department (HOSPITAL_COMMUNITY): Payer: Medicaid Other | Admitting: Anesthesiology

## 2012-12-13 ENCOUNTER — Encounter (HOSPITAL_COMMUNITY): Payer: Self-pay | Admitting: Anesthesiology

## 2012-12-13 ENCOUNTER — Encounter (HOSPITAL_COMMUNITY): Payer: Self-pay | Admitting: Nurse Practitioner

## 2012-12-13 ENCOUNTER — Inpatient Hospital Stay: Admit: 2012-12-13 | Payer: Self-pay | Admitting: Orthopedic Surgery

## 2012-12-13 DIAGNOSIS — M25571 Pain in right ankle and joints of right foot: Secondary | ICD-10-CM

## 2012-12-13 DIAGNOSIS — G8929 Other chronic pain: Secondary | ICD-10-CM | POA: Diagnosis present

## 2012-12-13 DIAGNOSIS — M19049 Primary osteoarthritis, unspecified hand: Secondary | ICD-10-CM | POA: Diagnosis present

## 2012-12-13 DIAGNOSIS — I10 Essential (primary) hypertension: Secondary | ICD-10-CM | POA: Diagnosis present

## 2012-12-13 DIAGNOSIS — Z9089 Acquired absence of other organs: Secondary | ICD-10-CM

## 2012-12-13 DIAGNOSIS — F411 Generalized anxiety disorder: Secondary | ICD-10-CM | POA: Diagnosis present

## 2012-12-13 DIAGNOSIS — Z886 Allergy status to analgesic agent status: Secondary | ICD-10-CM

## 2012-12-13 DIAGNOSIS — M25471 Effusion, right ankle: Secondary | ICD-10-CM

## 2012-12-13 DIAGNOSIS — K219 Gastro-esophageal reflux disease without esophagitis: Secondary | ICD-10-CM | POA: Diagnosis present

## 2012-12-13 DIAGNOSIS — Z885 Allergy status to narcotic agent status: Secondary | ICD-10-CM

## 2012-12-13 DIAGNOSIS — M009 Pyogenic arthritis, unspecified: Principal | ICD-10-CM | POA: Diagnosis present

## 2012-12-13 DIAGNOSIS — F17200 Nicotine dependence, unspecified, uncomplicated: Secondary | ICD-10-CM | POA: Diagnosis present

## 2012-12-13 DIAGNOSIS — K7460 Unspecified cirrhosis of liver: Secondary | ICD-10-CM | POA: Diagnosis present

## 2012-12-13 DIAGNOSIS — B1920 Unspecified viral hepatitis C without hepatic coma: Secondary | ICD-10-CM | POA: Diagnosis present

## 2012-12-13 DIAGNOSIS — B1910 Unspecified viral hepatitis B without hepatic coma: Secondary | ICD-10-CM | POA: Diagnosis present

## 2012-12-13 DIAGNOSIS — G2581 Restless legs syndrome: Secondary | ICD-10-CM | POA: Diagnosis present

## 2012-12-13 DIAGNOSIS — Z888 Allergy status to other drugs, medicaments and biological substances status: Secondary | ICD-10-CM

## 2012-12-13 HISTORY — PX: I&D EXTREMITY: SHX5045

## 2012-12-13 LAB — C-REACTIVE PROTEIN: CRP: 4.7 mg/dL — ABNORMAL HIGH (ref <0.60)

## 2012-12-13 LAB — CBC WITH DIFFERENTIAL/PLATELET
Basophils Absolute: 0 10*3/uL (ref 0.0–0.1)
Basophils Relative: 0 % (ref 0–1)
HCT: 40.9 % (ref 39.0–52.0)
Lymphocytes Relative: 25 % (ref 12–46)
Monocytes Absolute: 0.7 10*3/uL (ref 0.1–1.0)
Neutro Abs: 3.5 10*3/uL (ref 1.7–7.7)
Neutrophils Relative %: 61 % (ref 43–77)
Platelets: 108 10*3/uL — ABNORMAL LOW (ref 150–400)
RDW: 14.6 % (ref 11.5–15.5)
WBC: 5.7 10*3/uL (ref 4.0–10.5)

## 2012-12-13 LAB — BASIC METABOLIC PANEL
Calcium: 9.7 mg/dL (ref 8.4–10.5)
GFR calc Af Amer: >90 mL/min (ref >90)
GFR calc non Af Amer: >90 mL/min (ref >90)
Potassium: 3.8 mEq/L (ref 3.5–5.1)
Sodium: 135 mEq/L (ref 135–145)

## 2012-12-13 LAB — SEDIMENTATION RATE: Sed Rate: 68 mm/hr — ABNORMAL HIGH (ref 0–16)

## 2012-12-13 LAB — SYNOVIAL CELL COUNT + DIFF, W/ CRYSTALS
Crystals, Fluid: NONE SEEN
Monocyte-Macrophage-Synovial Fluid: 2 % — ABNORMAL LOW (ref 50–90)
WBC, Synovial: 73375 /mm3 — ABNORMAL HIGH (ref 0–200)

## 2012-12-13 LAB — PROTIME-INR
INR: 1.05 (ref 0.00–1.49)
Prothrombin Time: 13.6 seconds (ref 11.6–15.2)

## 2012-12-13 LAB — GRAM STAIN

## 2012-12-13 LAB — APTT: aPTT: 80 seconds — ABNORMAL HIGH (ref 24–37)

## 2012-12-13 SURGERY — IRRIGATION AND DEBRIDEMENT EXTREMITY
Anesthesia: General | Laterality: Right

## 2012-12-13 MED ORDER — VANCOMYCIN HCL 1000 MG IV SOLR
1000.0000 mg | INTRAVENOUS | Status: DC | PRN
  Administered 2012-12-13: 1500 mg via INTRAVENOUS

## 2012-12-13 MED ORDER — HYDROMORPHONE HCL PF 1 MG/ML IJ SOLN
1.0000 mg | Freq: Once | INTRAMUSCULAR | Status: AC
  Administered 2012-12-13: 1 mg via INTRAVENOUS
  Filled 2012-12-13: qty 1

## 2012-12-13 MED ORDER — POLYETHYLENE GLYCOL 3350 17 G PO PACK
17.0000 g | PACK | Freq: Every day | ORAL | Status: DC | PRN
  Administered 2012-12-14: 17 g via ORAL
  Filled 2012-12-13: qty 1

## 2012-12-13 MED ORDER — FENTANYL CITRATE 0.05 MG/ML IJ SOLN
25.0000 ug | INTRAMUSCULAR | Status: DC | PRN
  Administered 2012-12-13 (×2): 50 ug via INTRAVENOUS

## 2012-12-13 MED ORDER — HYDROMORPHONE HCL PF 1 MG/ML IJ SOLN: 1.0000 mg | INTRAMUSCULAR | Status: DC | PRN

## 2012-12-13 MED ORDER — ROPINIROLE HCL 1 MG PO TABS
3.0000 mg | ORAL_TABLET | Freq: Every day | ORAL | Status: DC
  Administered 2012-12-13 – 2012-12-15 (×3): 3 mg via ORAL
  Filled 2012-12-13 (×4): qty 3

## 2012-12-13 MED ORDER — FENTANYL CITRATE 0.05 MG/ML IJ SOLN
INTRAMUSCULAR | Status: AC
  Filled 2012-12-13: qty 2

## 2012-12-13 MED ORDER — HYDROMORPHONE HCL PF 1 MG/ML IJ SOLN
1.0000 mg | Freq: Once | INTRAMUSCULAR | Status: AC
  Administered 2012-12-13: 1 mg via INTRAVENOUS

## 2012-12-13 MED ORDER — HYDROMORPHONE HCL PF 1 MG/ML IJ SOLN
1.0000 mg | Freq: Once | INTRAMUSCULAR | Status: DC
  Filled 2012-12-13: qty 1

## 2012-12-13 MED ORDER — LIDOCAINE HCL (CARDIAC) 20 MG/ML IV SOLN
INTRAVENOUS | Status: DC | PRN
  Administered 2012-12-13: 100 mg via INTRAVENOUS

## 2012-12-13 MED ORDER — OXYCODONE HCL 5 MG PO TABS
5.0000 mg | ORAL_TABLET | ORAL | Status: DC | PRN
  Administered 2012-12-13 – 2012-12-16 (×13): 15 mg via ORAL
  Filled 2012-12-13 (×14): qty 3

## 2012-12-13 MED ORDER — ONDANSETRON HCL 4 MG/2ML IJ SOLN: 4.0000 mg | Freq: Once | INTRAMUSCULAR | Status: DC | PRN

## 2012-12-13 MED ORDER — ONDANSETRON HCL 4 MG PO TABS: 4.0000 mg | ORAL_TABLET | Freq: Three times a day (TID) | ORAL | Status: DC | PRN

## 2012-12-13 MED ORDER — FENTANYL CITRATE 0.05 MG/ML IJ SOLN
INTRAMUSCULAR | Status: DC | PRN
  Administered 2012-12-13 (×2): 100 ug via INTRAVENOUS
  Administered 2012-12-13: 50 ug via INTRAVENOUS

## 2012-12-13 MED ORDER — OXYCODONE HCL 5 MG PO TABS
10.0000 mg | ORAL_TABLET | Freq: Once | ORAL | Status: AC
  Administered 2012-12-13: 10 mg via ORAL
  Filled 2012-12-13 (×2): qty 1

## 2012-12-13 MED ORDER — SUCCINYLCHOLINE CHLORIDE 20 MG/ML IJ SOLN
INTRAMUSCULAR | Status: DC | PRN
  Administered 2012-12-13: 120 mg via INTRAVENOUS

## 2012-12-13 MED ORDER — DOCUSATE SODIUM 100 MG PO CAPS
100.0000 mg | ORAL_CAPSULE | Freq: Two times a day (BID) | ORAL | Status: DC
  Administered 2012-12-13 – 2012-12-16 (×6): 100 mg via ORAL
  Filled 2012-12-13 (×8): qty 1

## 2012-12-13 MED ORDER — ALBUTEROL SULFATE HFA 108 (90 BASE) MCG/ACT IN AERS
1.0000 | INHALATION_SPRAY | RESPIRATORY_TRACT | Status: DC | PRN
  Filled 2012-12-13: qty 6.7

## 2012-12-13 MED ORDER — HYDROMORPHONE HCL PF 1 MG/ML IJ SOLN
0.5000 mg | INTRAMUSCULAR | Status: DC | PRN
  Administered 2012-12-13 – 2012-12-16 (×26): 1 mg via INTRAVENOUS
  Filled 2012-12-13 (×26): qty 1

## 2012-12-13 MED ORDER — ONDANSETRON HCL 4 MG/2ML IJ SOLN
4.0000 mg | Freq: Four times a day (QID) | INTRAMUSCULAR | Status: DC | PRN
  Administered 2012-12-14: 4 mg via INTRAVENOUS
  Filled 2012-12-13: qty 2

## 2012-12-13 MED ORDER — METOCLOPRAMIDE HCL 10 MG PO TABS: 5.0000 mg | ORAL_TABLET | Freq: Three times a day (TID) | ORAL | Status: DC | PRN

## 2012-12-13 MED ORDER — SODIUM CHLORIDE 0.9 % IV SOLN
INTRAVENOUS | Status: DC
  Administered 2012-12-13 – 2012-12-15 (×3): via INTRAVENOUS
  Filled 2012-12-13 (×8): qty 1000

## 2012-12-13 MED ORDER — SODIUM CHLORIDE 0.9 % IR SOLN
Status: DC | PRN
  Administered 2012-12-13: 2000 mL

## 2012-12-13 MED ORDER — LACTATED RINGERS IV SOLN
INTRAVENOUS | Status: DC | PRN
  Administered 2012-12-13 (×2): via INTRAVENOUS

## 2012-12-13 MED ORDER — PROPOFOL 10 MG/ML IV BOLUS
INTRAVENOUS | Status: DC | PRN
  Administered 2012-12-13: 200 mg via INTRAVENOUS
  Administered 2012-12-13: 100 mg via INTRAVENOUS

## 2012-12-13 MED ORDER — ROPINIROLE HCL 1 MG PO TABS
3.0000 mg | ORAL_TABLET | Freq: Every day | ORAL | Status: DC
  Filled 2012-12-13: qty 3

## 2012-12-13 MED ORDER — METOCLOPRAMIDE HCL 5 MG/ML IJ SOLN: 5.0000 mg | Freq: Three times a day (TID) | INTRAMUSCULAR | Status: DC | PRN

## 2012-12-13 MED ORDER — MIDAZOLAM HCL 5 MG/5ML IJ SOLN
INTRAMUSCULAR | Status: DC | PRN
  Administered 2012-12-13: 2 mg via INTRAVENOUS

## 2012-12-13 MED ORDER — AMITRIPTYLINE HCL 25 MG PO TABS
25.0000 mg | ORAL_TABLET | Freq: Every day | ORAL | Status: DC
  Administered 2012-12-13 – 2012-12-15 (×3): 25 mg via ORAL
  Filled 2012-12-13 (×4): qty 1

## 2012-12-13 MED ORDER — VANCOMYCIN HCL IN DEXTROSE 1-5 GM/200ML-% IV SOLN
1000.0000 mg | Freq: Two times a day (BID) | INTRAVENOUS | Status: DC
  Administered 2012-12-14 – 2012-12-16 (×5): 1000 mg via INTRAVENOUS
  Filled 2012-12-13 (×9): qty 200

## 2012-12-13 MED ORDER — ONDANSETRON HCL 4 MG PO TABS: 4.0000 mg | ORAL_TABLET | Freq: Four times a day (QID) | ORAL | Status: DC | PRN

## 2012-12-13 SURGICAL SUPPLY — 57 items
BANDAGE ELASTIC 4 VELCRO ST LF (GAUZE/BANDAGES/DRESSINGS) ×2 IMPLANT
BANDAGE GAUZE ELAST BULKY 4 IN (GAUZE/BANDAGES/DRESSINGS) IMPLANT
BNDG ESMARK 4X9 LF (GAUZE/BANDAGES/DRESSINGS) ×2 IMPLANT
CLOTH BEACON ORANGE TIMEOUT ST (SAFETY) ×2 IMPLANT
CUFF TOURNIQUET SINGLE 18IN (TOURNIQUET CUFF) IMPLANT
CUFF TOURNIQUET SINGLE 24IN (TOURNIQUET CUFF) IMPLANT
CUFF TOURNIQUET SINGLE 34IN LL (TOURNIQUET CUFF) IMPLANT
CUFF TOURNIQUET SINGLE 44IN (TOURNIQUET CUFF) IMPLANT
DRAPE SURG 17X23 STRL (DRAPES) ×2 IMPLANT
DRAPE U-SHAPE 47X51 STRL (DRAPES) IMPLANT
DRSG EMULSION OIL 3X3 NADH (GAUZE/BANDAGES/DRESSINGS) IMPLANT
DRSG MEPILEX BORDER 4X4 (GAUZE/BANDAGES/DRESSINGS) ×2 IMPLANT
DRSG PAD ABDOMINAL 8X10 ST (GAUZE/BANDAGES/DRESSINGS) IMPLANT
DURAPREP 26ML APPLICATOR (WOUND CARE) ×2 IMPLANT
ELECT REM PT RETURN 9FT ADLT (ELECTROSURGICAL)
ELECTRODE REM PT RTRN 9FT ADLT (ELECTROSURGICAL) IMPLANT
FACESHIELD LNG OPTICON STERILE (SAFETY) IMPLANT
GAUZE SPONGE 2X2 8PLY STRL LF (GAUZE/BANDAGES/DRESSINGS) ×1 IMPLANT
GAUZE XEROFORM 1X8 LF (GAUZE/BANDAGES/DRESSINGS) ×2 IMPLANT
GLOVE BIO SURGEON STRL SZ8 (GLOVE) ×2 IMPLANT
GLOVE BIOGEL PI IND STRL 7.5 (GLOVE) ×1 IMPLANT
GLOVE BIOGEL PI IND STRL 8 (GLOVE) ×1 IMPLANT
GLOVE BIOGEL PI INDICATOR 7.5 (GLOVE) ×1
GLOVE BIOGEL PI INDICATOR 8 (GLOVE) ×1
GLOVE ECLIPSE 8.0 STRL XLNG CF (GLOVE) ×2 IMPLANT
GLOVE ORTHO TXT STRL SZ7.5 (GLOVE) ×2 IMPLANT
GLOVE SURG ORTHO 8.0 STRL STRW (GLOVE) ×2 IMPLANT
GOWN STRL NON-REIN LRG LVL3 (GOWN DISPOSABLE) ×6 IMPLANT
GOWN STRL REIN 3XL XLG LVL4 (GOWN DISPOSABLE) ×2 IMPLANT
HANDPIECE INTERPULSE COAX TIP (DISPOSABLE)
KIT BASIN OR (CUSTOM PROCEDURE TRAY) ×2 IMPLANT
KIT ROOM TURNOVER OR (KITS) ×2 IMPLANT
MANIFOLD NEPTUNE II (INSTRUMENTS) ×2 IMPLANT
NS IRRIG 1000ML POUR BTL (IV SOLUTION) ×2 IMPLANT
PACK ORTHO EXTREMITY (CUSTOM PROCEDURE TRAY) ×2 IMPLANT
PAD ARMBOARD 7.5X6 YLW CONV (MISCELLANEOUS) ×4 IMPLANT
SET HNDPC FAN SPRY TIP SCT (DISPOSABLE) IMPLANT
SPONGE GAUZE 2X2 STER 10/PKG (GAUZE/BANDAGES/DRESSINGS) ×1
SPONGE GAUZE 4X4 12PLY (GAUZE/BANDAGES/DRESSINGS) IMPLANT
SPONGE LAP 18X18 X RAY DECT (DISPOSABLE) ×2 IMPLANT
SPONGE LAP 4X18 X RAY DECT (DISPOSABLE) IMPLANT
STOCKINETTE IMPERVIOUS 9X36 MD (GAUZE/BANDAGES/DRESSINGS) ×2 IMPLANT
SUCTION FRAZIER TIP 10 FR DISP (SUCTIONS) IMPLANT
SUT ETHILON 3 0 PS 1 (SUTURE) ×2 IMPLANT
SUT SILK 2 0 FS (SUTURE) IMPLANT
SUT VIC AB 1 CT1 27 (SUTURE) ×1
SUT VIC AB 1 CT1 27XBRD ANBCTR (SUTURE) ×1 IMPLANT
SUT VIC AB 2-0 CT1 27 (SUTURE) ×1
SUT VIC AB 2-0 CT1 TAPERPNT 27 (SUTURE) ×1 IMPLANT
SYR CONTROL 10ML LL (SYRINGE) ×2 IMPLANT
TOWEL OR 17X24 6PK STRL BLUE (TOWEL DISPOSABLE) ×2 IMPLANT
TOWEL OR 17X26 10 PK STRL BLUE (TOWEL DISPOSABLE) ×2 IMPLANT
TUBE ANAEROBIC SPECIMEN COL (MISCELLANEOUS) IMPLANT
TUBE CONNECTING 12X1/4 (SUCTIONS) ×2 IMPLANT
UNDERPAD 30X30 INCONTINENT (UNDERPADS AND DIAPERS) ×2 IMPLANT
WATER STERILE IRR 1000ML POUR (IV SOLUTION) ×2 IMPLANT
YANKAUER SUCT BULB TIP NO VENT (SUCTIONS) ×2 IMPLANT

## 2012-12-13 NOTE — Transfer of Care (Signed)
Immediate Anesthesia Transfer of Care Note  Patient: Mark Christensen  Procedure(s) Performed: Procedure(s): IRRIGATION AND DEBRIDEMENT EXTREMITY (Right)  Patient Location: PACU  Anesthesia Type:General  Level of Consciousness: awake, alert , oriented and patient cooperative  Airway & Oxygen Therapy: Patient Spontanous Breathing and Patient connected to nasal cannula oxygen  Post-op Assessment: Report given to PACU RN, Post -op Vital signs reviewed and stable and Patient moving all extremities X 4  Post vital signs: Reviewed and stable  Complications: No apparent anesthesia complications

## 2012-12-13 NOTE — Progress Notes (Signed)
ANTIBIOTIC CONSULT NOTE - INITIAL  Pharmacy Consult for vancomycin Indication: septic joint  Allergies  Allergen Reactions  . Tylenol (Acetaminophen) Other (See Comments)    Cirrhosis  . Morphine And Related Itching    itching  . Tramadol Nausea And Vomiting and Swelling    Abdominal swelling     Patient Measurements: Height: 6\' 4"  (193 cm) Weight: 260 lb (117.935 kg) IBW/kg (Calculated) : 86.8 Adjusted Body Weight: 117.9 kg  Vital Signs: Temp: 97.9 F (36.6 C) (03/22 2059) Temp src: Oral (03/22 1505) BP: 147/89 mmHg (03/22 2059) Pulse Rate: 77 (03/22 2053) Intake/Output from previous day:   Intake/Output from this shift: Total I/O In: 1100 [I.V.:1100] Out: -   Labs:  Recent Labs  12/13/12 1142  WBC 5.7  HGB 14.5  PLT 108*  CREATININE 0.67   Estimated Creatinine Clearance: 156.7 ml/min (by C-G formula based on Cr of 0.67). No results found for this basename: VANCOTROUGH, Leodis Binet, VANCORANDOM, GENTTROUGH, GENTPEAK, GENTRANDOM, TOBRATROUGH, TOBRAPEAK, TOBRARND, AMIKACINPEAK, AMIKACINTROU, AMIKACIN,  in the last 72 hours   Microbiology: Recent Results (from the past 720 hour(s))  MRSA PCR SCREENING     Status: Abnormal   Collection Time    11/18/12  1:34 AM      Result Value Range Status   MRSA by PCR POSITIVE (*) NEGATIVE Final   Comment:            The GeneXpert MRSA Assay (FDA     approved for NASAL specimens     only), is one component of a     comprehensive MRSA colonization     surveillance program. It is not     intended to diagnose MRSA     infection nor to guide or     monitor treatment for     MRSA infections.     RESULT CALLED TO, READ BACK BY AND VERIFIED WITH:     CALLED TO RN MATT YORK [id number] @0623  Newton Pigg STAIN     Status: None   Collection Time    12/13/12  8:37 PM      Result Value Range Status   Specimen Description ABSCESS   Final   Special Requests RIGHT MEDIAL ANKLE   Final   Gram Stain     Final   Value: FEW WBC  PRESENT, PREDOMINANTLY PMN     NO ORGANISMS SEEN   Report Status 12/13/2012 FINAL   Final    Medical History: Past Medical History  Diagnosis Date  . Hepatic cirrhosis   . Hypertension     NO LONGER TAKING B/P MEDS--STATES DISCONTINUED BY HIS DOCTOR  . Anxiety   . Shortness of breath   . GERD (gastroesophageal reflux disease)   . Restless leg syndrome   . Complication of anesthesia     ENDO/COLONOSCOPY ATTEMPTED IN THE PAST-UNABLE TO ADEQUATELY SEDATE PT-TOLD WOULD NEED ANESTHESIA FOR FUTURE PROCEDURES  . Pneumonia 09-2012  . Headache     HX OF MIGRAINES  . Arthritis     hands  . Hepatitis B   . Hepatitis C     Medications:  Scheduled:  . amitriptyline  25 mg Oral QHS  . docusate sodium  100 mg Oral BID  . fentaNYL      . [COMPLETED]  HYDROmorphone (DILAUDID) injection  1 mg Intravenous Once  . [COMPLETED]  HYDROmorphone (DILAUDID) injection  1 mg Intravenous Once  . [COMPLETED] oxyCODONE  10 mg Oral Once  . rOPINIRole  3 mg Oral QHS  . [  DISCONTINUED]  HYDROmorphone (DILAUDID) injection  1 mg Intravenous Once   Infusions:  . sodium chloride 0.9 % 1,000 mL with potassium chloride 10 mEq infusion     Assessment: 52 yo male with septic joint will be continued on vancomycin therapy.  Patient received vancomycin 1g iv x1 at 2000.  CrCl > 100  Goal of Therapy:  Vancomycin trough level 15-20 mcg/ml  Plan:  1) vancomycin 1g iv q12h, next dose at 0800 2) Follow up on plan of antibiotic before checking vancomycin trough  Aika Brzoska, Tsz-Yin 12/13/2012,9:36 PM

## 2012-12-13 NOTE — ED Notes (Signed)
Pt c/o R ankle pain and swelling since 2 days ago. Denies any injuries.

## 2012-12-13 NOTE — ED Provider Notes (Signed)
Patient with swollen and tender and erythematous right ankle with a history of fever last night. This has been gradually worsening over the last 3 days, he is unable to ambulate on it and has severe pain with range of motion. On my exam he has a warm tender joint,, it is definitely swollen and he resists any movement of the joint. There is no other joint arthropathies including bilateral upper and lower extremities other than the right ankle. Heart and lungs appear clear, he is not tachycardic at this time and is not febrile. He is not a leukocytosis though he does have an elevated sedimentation rate. At this time I would consider that septic arthritis is high on his differential as is acute gouty arthritis however given his history of a fever of 102.5 last night I have consult orthopedic surgery to evaluate the patient in emergency department for a possible  Operating room procedure for a washout or at least the diagnosis by fluid.  Dr. Charlann Boxer to see pt in the ED.  Requests hold on Abx at this time until has some fluid.  Medical screening examination/treatment/procedure(s) were conducted as a shared visit with non-physician practitioner(s) and myself.  I personally evaluated the patient during the encounter    Vida Roller, MD 12/13/12 1459

## 2012-12-13 NOTE — ED Notes (Signed)
Pt moved from Fast Track bed 5 to POD A bed 10. Care transferred, report given to [place], [place].

## 2012-12-13 NOTE — H&P (Signed)
Mark Christensen is an 52 y.o. male.    Chief Complaint:  Painful right ankle, questionable infection, septic ankle joint   HPI: Pt is a 52 y.o. male complaining of right ankle pain.  Pain has progressed over this past week.  Inability to walk/bear weight.  Reports fever as high as 102 at home. Denies drug use.  Currently does not work on disability, chronic pain.  Works around house on his car, typically wears tall boots, laced up  PCP:  Jeri Cos, MD  D/C Plans: To be determined following appropriate treatment plan  PMH: Past Medical History  Diagnosis Date  . Hepatic cirrhosis   . Hypertension     NO LONGER TAKING B/P MEDS--STATES DISCONTINUED BY HIS DOCTOR  . Anxiety   . Shortness of breath   . GERD (gastroesophageal reflux disease)   . Restless leg syndrome   . Complication of anesthesia     ENDO/COLONOSCOPY ATTEMPTED IN THE PAST-UNABLE TO ADEQUATELY SEDATE PT-TOLD WOULD NEED ANESTHESIA FOR FUTURE PROCEDURES  . Pneumonia 09-2012  . Headache     HX OF MIGRAINES  . Arthritis     hands  . Hepatitis B   . Hepatitis C     PSH: Past Surgical History  Procedure Laterality Date  . Appendectomy    . Esophageal banding      FOR TX OF GI BLEED ABOUT 2008  . Arm debridement  08-11-12    right forearm wound with debridement 2 months ago/ right abdomen also-healing well  . Multiple tooth extractions  08/12/2012  . Colonoscopy with propofol  08/13/2012    Procedure: COLONOSCOPY WITH PROPOFOL;  Surgeon: Willis Modena, MD;  Location: [facility] ENDOSCOPY;  Service: Endoscopy;  Laterality: N/A;  . 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 Esophagogastroduodenoscopy N/A 11/18/2012    Procedure: ESOPHAGOGASTRODUODENOSCOPY (EGD);  Surgeon: Shirley Friar, MD;  Location: [facility] ENDOSCOPY;  Service: Endoscopy;  Laterality: N/A;  . Esophageal banding  11/18/2012    Procedure: ESOPHAGEAL BANDING;  Surgeon: Shirley Friar, MD;  Location: [facility] ENDOSCOPY;  Service:  Endoscopy;;    Social History:  reports that he has been smoking Cigarettes.  He has a 15 pack-year smoking history. He has never used smokeless tobacco. He reports that he does not drink alcohol or use illicit drugs.  Allergies:  Allergies  Allergen Reactions  . Tylenol (Acetaminophen) Other (See Comments)    Cirrhosis  . Morphine And Related Itching    itching  . Tramadol Nausea And Vomiting and Swelling    Abdominal swelling     Medications:  (Not in a hospital admission)  Results for orders placed during the hospital encounter of 12/13/12 (from the past 48 hour(s))  CBC WITH DIFFERENTIAL     Status: Abnormal   Collection Time    12/13/12 11:42 AM      Result Value Range   WBC 5.7  4.0 - 10.5 K/uL   RBC 4.89  4.22 - 5.81 MIL/uL   Hemoglobin 14.5  13.0 - 17.0 g/dL   HCT 81.1  91.4 - 78.2 %   MCV 83.6  78.0 - 100.0 fL   MCH 29.7  26.0 - 34.0 pg   MCHC 35.5  30.0 - 36.0 g/dL   RDW 95.6  21.3 - 08.6 %   Platelets 108 (*) 150 - 400 K/uL   Comment: PLATELET COUNT CONFIRMED BY SMEAR   Neutrophils Relative 61  43 - 77 %  Neutro Abs 3.5  1.7 - 7.7 K/uL   Lymphocytes Relative 25  12 - 46 %   Lymphs Abs 1.4  0.7 - 4.0 K/uL   Monocytes Relative 12  3 - 12 %   Monocytes Absolute 0.7  0.1 - 1.0 K/uL   Eosinophils Relative 3  0 - 5 %   Eosinophils Absolute 0.1  0.0 - 0.7 K/uL   Basophils Relative 0  0 - 1 %   Basophils Absolute 0.0  0.0 - 0.1 K/uL  SEDIMENTATION RATE     Status: Abnormal   Collection Time    12/13/12 11:42 AM      Result Value Range   Sed Rate 68 (*) 0 - 16 mm/hr  BASIC METABOLIC PANEL     Status: None   Collection Time    12/13/12 11:42 AM      Result Value Range   Sodium 135  135 - 145 mEq/L   Potassium 3.8  3.5 - 5.1 mEq/L   Chloride 103  96 - 112 mEq/L   CO2 20  19 - 32 mEq/L   Glucose, Bld 96  70 - 99 mg/dL   BUN 9  6 - 23 mg/dL   Creatinine, Ser 6.21  0.50 - 1.35 mg/dL   Calcium 9.7  8.4 - 30.8 mg/dL   GFR calc non Af Amer >90  >90 mL/min    GFR calc Af Amer >90  >90 mL/min   Comment:            The eGFR has been calculated     using the CKD EPI equation.     This calculation has not been     validated in all clinical     situations.     eGFR's persistently     <90 mL/min signify     possible Chronic Kidney Disease.  PROTIME-INR     Status: None   Collection Time    12/13/12 11:42 AM      Result Value Range   Prothrombin Time 13.6  11.6 - 15.2 seconds   INR 1.05  0.00 - 1.49  APTT     Status: Abnormal   Collection Time    12/13/12 11:42 AM      Result Value Range   aPTT 80 (*) 24 - 37 seconds   Comment:            IF BASELINE aPTT IS ELEVATED,     SUGGEST PATIENT RISK ASSESSMENT     BE USED TO DETERMINE APPROPRIATE     ANTICOAGULANT THERAPY.   Dg Ankle Complete Right  12/13/2012  *RADIOLOGY REPORT*  Clinical Data: Pain without injury  RIGHT ANKLE - COMPLETE 3+ VIEW  Comparison: None.  Findings: Ankle mortise intact. Negative for fracture, dislocation, or other acute abnormality.  Normal alignment and mineralization. No significant degenerative change.  Regional soft tissues unremarkable.  IMPRESSION:  Negative   Original Report Authenticated By: D. Andria Rhein, MD     ROS: Review of Systems - General ROS: negative Gastrointestinal ROS: no abdominal pain, change in bowel habits, or black or bloody stools despite cirhhosis of liver Genito-Urinary ROS: no dysuria, trouble voiding, or hematuria Musculoskeletal ROS: negative Neurological ROS: no TIA or stroke symptoms Dermatological ROS: positive for erythema   No history of gout  Physican Exam: Blood pressure 124/88, pulse 78, temperature 98.6 F (37 C), temperature source Oral, resp. rate 18, SpO2 97.00%.  EXAM: In stretcher, awake alert oriented, in pain Chest clear  Heart rate normal Right ankle swelling tender painful to passive motion Palpable pulses, neuro intact  Right UE, previous I&D, scars  Left lower extremity normal, not  affected  Assessment/Plan Assessment:  Questionable septic right ankle   Plan: Patient will undergo a incisional debridement of the right ankle tonight. Risks benefits and expectation were discussed with the patient. Patient understand risks, benefits and expectation and wishes to proceed.  Following procedure he will be admitted for IV antibiotics Cultures to be taken in OR Question need for repeat surgery  Will be weight bearing as tolerated  Madlyn Frankel. Charlann Boxer, MD  12/13/2012, 4:44 PM

## 2012-12-13 NOTE — ED Notes (Signed)
Patient transported to X-ray in wheelchair 

## 2012-12-13 NOTE — ED Notes (Signed)
MD at bedside. (Dr. Miller) 

## 2012-12-13 NOTE — ED Provider Notes (Signed)
History     CSN: [id number]  Arrival date & time 12/13/12  1004   First MD Initiated Contact with Patient 12/13/12 1024      Chief Complaint  Patient presents with  . Ankle Pain    (Consider location/radiation/quality/duration/timing/severity/associated sxs/prior treatment) The history is provided by the patient and medical records. No language interpreter was used.   Mark Christensen is a 52 y.o. male  with a hx of hepatic cirrhosis, GERD, esophageal varices, hypertension, anxiety presents to the Emergency Department complaining of gradual, persistent, progressively worsening right ankle pain and swelling onset 4 days ago. Associated symptoms include fever to 102, chills, night sweats, gait disturbance secondary to pain, ankle swelling, ankle erythema.  Nothing makes it better and induration, palpation, movement makes it worse.  Pt denies headache, neck pain, neck stiffness, chest pain, shortness of breath abdominal pain, nausea, vomiting, diarrhea, weakness, dizziness, syncope, dysuria, penile discharge, hematuria.  Patient denies a history of gout, IV drug use. He endorses a history of necrotizing fasciitis of the right arm approximately 2 years ago. He was recently hospitalized for esophageal bleed.     Past Medical History  Diagnosis Date  . Hepatic cirrhosis   . Hypertension     NO LONGER TAKING B/P MEDS--STATES DISCONTINUED BY HIS DOCTOR  . Anxiety   . Shortness of breath   . GERD (gastroesophageal reflux disease)   . Restless leg syndrome   . Complication of anesthesia     ENDO/COLONOSCOPY ATTEMPTED IN THE PAST-UNABLE TO ADEQUATELY SEDATE PT-TOLD WOULD NEED ANESTHESIA FOR FUTURE PROCEDURES  . Pneumonia 09-2012  . Headache     HX OF MIGRAINES  . Arthritis     hands  . Hepatitis B   . Hepatitis C     Past Surgical History  Procedure Laterality Date  . Appendectomy    . Esophageal banding      FOR TX OF GI BLEED ABOUT 2008  . Arm debridement  08-11-12    right forearm  wound with debridement 2 months ago/ right abdomen also-healing well  . Multiple tooth extractions  08/12/2012  . Colonoscopy with propofol  08/13/2012    Procedure: COLONOSCOPY WITH PROPOFOL;  Surgeon: Willis Modena, MD;  Location: [facility] ENDOSCOPY;  Service: Endoscopy;  Laterality: N/A;  . 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 Esophagogastroduodenoscopy N/A 11/18/2012    Procedure: ESOPHAGOGASTRODUODENOSCOPY (EGD);  Surgeon: Shirley Friar, MD;  Location: [facility] ENDOSCOPY;  Service: Endoscopy;  Laterality: N/A;  . Esophageal banding  11/18/2012    Procedure: ESOPHAGEAL BANDING;  Surgeon: Shirley Friar, MD;  Location: [facility] ENDOSCOPY;  Service: Endoscopy;;    History reviewed. No pertinent family history.  History  Substance Use Topics  . Smoking status: Current Every Day Smoker -- 0.50 packs/day for 30 years    Types: Cigarettes  . Smokeless tobacco: Never Used  . Alcohol Use: No     Comment: RARELY USES ALCOHOL      Review of Systems  Constitutional: Positive for fever, chills and diaphoresis.  HENT: Negative for neck pain and neck stiffness.   Musculoskeletal: Positive for joint swelling, arthralgias and gait problem. Negative for back pain.  Skin: Negative for wound.  Neurological: Negative for numbness.  All other systems reviewed and are negative.    Allergies  Tylenol; Morphine and related; and Tramadol  Home Medications   Current Outpatient Rx  Name  Route  Sig  Dispense  Refill  .  albuterol (PROVENTIL HFA;VENTOLIN HFA) 108 (90 BASE) MCG/ACT inhaler   Inhalation   Inhale 1-2 puffs into the lungs every 4 (four) hours as needed for wheezing.         Marland Kitchen amitriptyline (ELAVIL) 25 MG tablet   Oral   Take 25 mg by mouth at bedtime.         . ondansetron (ZOFRAN) 4 MG tablet   Oral   Take 1 tablet (4 mg total) by mouth every 8 (eight) hours as needed for nausea.   20 tablet   0   . oxyCODONE (ROXICODONE) 5 MG immediate release  tablet   Oral   Take 1 tablet (5 mg total) by mouth every 6 (six) hours as needed for pain.   10 tablet   0   . rOPINIRole (REQUIP) 3 MG tablet   Oral   Take 3 mg by mouth at bedtime.           BP 127/98  Pulse 76  Temp(Src) 98.4 F (36.9 C) (Oral)  Resp 20  SpO2 98%  Physical Exam  Nursing note and vitals reviewed. Constitutional: He appears well-developed and well-nourished. No distress.  HENT:  Head: Normocephalic and atraumatic.  Eyes: Conjunctivae are normal.  Neck: Normal range of motion.  Cardiovascular: Normal rate, regular rhythm and intact distal pulses.   Pulses:      Radial pulses are 2+ on the right side, and 2+ on the left side.       Dorsalis pedis pulses are 2+ on the right side, and 2+ on the left side.       Posterior tibial pulses are 2+ on the left side.  Capillary refill less than 3 seconds  Pulmonary/Chest: Effort normal and breath sounds normal.  Musculoskeletal: He exhibits edema and tenderness.       Right ankle: He exhibits decreased range of motion and swelling. He exhibits no ecchymosis, no deformity, no laceration and normal pulse. Tenderness. Lateral malleolus and medial malleolus tenderness found.       Feet:  ROM: Decreased in the right foot, full in all other joints of the right lower extremity  . Your edema noted to the lateral side of the right ankle, increased warmth, tenderness palpation  Neurological: He is alert. Coordination normal. GCS eye subscore is 4. GCS verbal subscore is 5. GCS motor subscore is 6.  Reflex Scores:      Tricep reflexes are 2+ on the right side and 2+ on the left side.      Bicep reflexes are 2+ on the right side and 2+ on the left side.      Brachioradialis reflexes are 2+ on the right side and 2+ on the left side.      Patellar reflexes are 2+ on the right side and 2+ on the left side.      Achilles reflexes are 2+ on the right side and 2+ on the left side. Sensation intact to dull and sharp  Strength  3/5 in the right ankle and 5/5 in all other joints of the bilateral lower extremities  Skin: Skin is warm and dry. He is not diaphoretic.  No tenting of the skin  Psychiatric: He has a normal mood and affect.    ED Course  Procedures (including critical care time)  Labs Reviewed  CBC WITH DIFFERENTIAL - Abnormal; Notable for the following:    Platelets 108 (*)    All other components within normal limits  SEDIMENTATION RATE - Abnormal; Notable  for the following:    Sed Rate 68 (*)    All other components within normal limits  APTT - Abnormal; Notable for the following:    aPTT 80 (*)    All other components within normal limits  BASIC METABOLIC PANEL  PROTIME-INR  C-REACTIVE PROTEIN   Dg Ankle Complete Right  12/13/2012  *RADIOLOGY REPORT*  Clinical Data: Pain without injury  RIGHT ANKLE - COMPLETE 3+ VIEW  Comparison: None.  Findings: Ankle mortise intact. Negative for fracture, dislocation, or other acute abnormality.  Normal alignment and mineralization. No significant degenerative change.  Regional soft tissues unremarkable.  IMPRESSION:  Negative   Original Report Authenticated By: D. Andria Rhein, MD      1. Ankle pain, right   2. Ankle swelling, right       MDM  Ezekiel Ina  presents with swollen right ankle concerning for septic joint versus gout versus orthopedic injury.  No history of gout I think this is unlikely. Patient denies IV drug use or history of gonorrhea however I think that septic joint is most likely. Will obtain x-rays and blood work. Patient pain managed here in the department.   Pt with elevated sedimentation rate but no leukocytosis.  APTT elevated but no relation PT/INR. Other labs unremarkable. Discussed this with Dr. Eber Hong who will continue to follow and consult with orthopedics for ankle joint aspiration.          Dahlia Client Rolando Hessling, PA-C 12/13/12 1348

## 2012-12-13 NOTE — Anesthesia Preprocedure Evaluation (Addendum)
Anesthesia Evaluation  Patient identified by MRN, date of birth, ID band Patient awake    Reviewed: Allergy & Precautions, H&P , NPO status , Patient's Chart, lab work & pertinent test results  Airway Mallampati: II      Dental  (+) Edentulous Upper and Edentulous Lower   Pulmonary  breath sounds clear to auscultation        Cardiovascular hypertension, Rhythm:Regular Rate:Normal     Neuro/Psych    GI/Hepatic   Endo/Other    Renal/GU      Musculoskeletal   Abdominal   Peds  Hematology   Anesthesia Other Findings   Reproductive/Obstetrics                         Anesthesia Physical Anesthesia Plan  ASA: III and emergent  Anesthesia Plan: General   Post-op Pain Management:    Induction:   Airway Management Planned: Oral ETT  Additional Equipment:   Intra-op Plan:   Post-operative Plan: Extubation in OR  Informed Consent: I have reviewed the patients History and Physical, chart, labs and discussed the procedure including the risks, benefits and alternatives for the proposed anesthesia with the patient or authorized representative who has indicated his/her understanding and acceptance.   Consent reviewed with POA  Plan Discussed with: CRNA and Surgeon  Anesthesia Plan Comments: (Probable septic arthritis R. Ankle H/O cirrhosis S/P variceal bleeding with banding 2/25 Smoker/COPD Chronic pain H/O ETOH abuse H/O Hep B and C  Plan GA with oral ETT  Kipp Brood, MD)        Anesthesia Quick Evaluation

## 2012-12-13 NOTE — Brief Op Note (Signed)
12/13/2012  8:25 PM  PATIENT:  Mark Christensen  52 y.o. male  PRE-OPERATIVE DIAGNOSIS:  right ankle probable septic joint  POST-OPERATIVE DIAGNOSIS:  right ankle probable septic joint  PROCEDURE:  Procedure(s): IRRIGATION AND DEBRIDEMENT EXTREMITY (Right) right ankle joint Open arthrotomy right ankle  SURGEON:  Surgeon(s) and Role:    * Shelda Pal, MD - Primary  PHYSICIAN ASSISTANT: None  ANESTHESIA:   general  EBL:  Total I/O In: 1000 [I.V.:1000] Out: -   BLOOD ADMINISTERED:none  DRAINS: none   LOCAL MEDICATIONS USED:  NONE  SPECIMEN:  Source of Specimen:  right ankle joint  DISPOSITION OF SPECIMEN:  PATHOLOGY  COUNTS:  YES  TOURNIQUET:  * No tourniquets in log *  DICTATION: .Other Dictation: Dictation Number [id number]  PLAN OF CARE: Admit to inpatient   PATIENT DISPOSITION:  PACU - hemodynamically stable.   Delay start of Pharmacological VTE agent (>24hrs) due to surgical blood loss or risk of bleeding: no

## 2012-12-13 NOTE — ED Notes (Signed)
Pt with right ankle pain x 4 days. Pt denies injury or trauma. Pt unable to bear weight. Subjective fevers, chills and night sweats at home. Generalized edema noted to right foot and ankle. Erythema present to right lateral maleolus.

## 2012-12-14 ENCOUNTER — Encounter (HOSPITAL_COMMUNITY): Payer: Self-pay | Admitting: *Deleted

## 2012-12-14 LAB — COMPREHENSIVE METABOLIC PANEL
ALT: 17 U/L (ref 0–53)
AST: 22 U/L (ref 0–37)
Albumin: 3 g/dL — ABNORMAL LOW (ref 3.5–5.2)
Alkaline Phosphatase: 84 U/L (ref 39–117)
Calcium: 9.4 mg/dL (ref 8.4–10.5)
GFR calc Af Amer: >90 mL/min (ref >90)
Potassium: 4.2 mEq/L (ref 3.5–5.1)
Sodium: 138 mEq/L (ref 135–145)
Total Protein: 7.7 g/dL (ref 6.0–8.3)

## 2012-12-14 MED ORDER — PANTOPRAZOLE SODIUM 40 MG PO TBEC
40.0000 mg | DELAYED_RELEASE_TABLET | Freq: Every day | ORAL | Status: DC
  Administered 2012-12-14 – 2012-12-15 (×2): 40 mg via ORAL
  Filled 2012-12-14 (×2): qty 1

## 2012-12-14 NOTE — Anesthesia Postprocedure Evaluation (Signed)
  Anesthesia Post-op Note  Patient: Mark Christensen  Procedure(s) Performed: Procedure(s): IRRIGATION AND DEBRIDEMENT EXTREMITY (Right)  Patient Location: PACU  Anesthesia Type:General  Level of Consciousness: awake, alert  and oriented  Airway and Oxygen Therapy: Patient Spontanous Breathing  Post-op Pain: mild  Post-op Assessment: Post-op Vital signs reviewed and Patient's Cardiovascular Status Stable  Post-op Vital Signs: stable  Complications: No apparent anesthesia complications

## 2012-12-14 NOTE — Progress Notes (Signed)
Mark Christensen  MRN: [id number] DOB/Age: 01/24/1961 52 y.o. Physician: Lynnea Maizes, M.D. 1 Day Post-Op Procedure(s) (LRB): IRRIGATION AND DEBRIDEMENT EXTREMITY (Right)  Subjective: Ankle feeling better than before surgery, was able to walk around unit Vital Signs Temp:  [97.4 F (36.3 C)-98.6 F (37 C)] 98 F (36.7 C) (03/23 0700) Pulse Rate:  [73-85] 74 (03/23 0700) Resp:  [12-20] 18 (03/23 0700) BP: (115-159)/(77-98) 149/85 mmHg (03/23 0700) SpO2:  [93 %-100 %] 98 % (03/23 0700) Weight:  [117.935 kg (260 lb)] 117.935 kg (260 lb) (03/22 1903)  Lab Results  Recent Labs  12/13/12 1142  WBC 5.7  HGB 14.5  HCT 40.9  PLT 108*   BMET  Recent Labs  12/13/12 1142 12/14/12 0525  NA 135 138  K 3.8 4.2  CL 103 103  CO2 20 25  GLUCOSE 96 99  BUN 9 10  CREATININE 0.67 0.69  CALCIUM 9.7 9.4   INR  Date Value Range Status  12/13/2012 1.05  0.00 - 1.49 Final     Exam  Right ankle dressings dry. Fair ankle and digital motion Cx NGSF, no organisms seen on gram stain  Plan Continue IV abx, ankle motion, monitor progress  Mark Christensen M 12/14/2012, 10:40 AM

## 2012-12-14 NOTE — Evaluation (Signed)
Physical Therapy Evaluation Patient Details Name: Mark Christensen MRN: [id number] DOB: Dec 02, 1960 Today's Date: 12/14/2012 Time: 4098-1191 PT Time Calculation (min): 45 min  PT Assessment / Plan / Recommendation Clinical Impression  pt presents with R ankle I+D.  pt mobility limited by pain at this time, but with increased time is able to perform mobility with very minimal A and set-up.  pt notes his wife is able to A him at home, however he will need a RW and 3-in-1.      PT Assessment  Patient needs continued PT services    Follow Up Recommendations  Outpatient PT;Supervision - Intermittent (OPPT once cleared by MD.  )    Does the patient have the potential to tolerate intense rehabilitation      Barriers to Discharge None      Equipment Recommendations  Rolling walker with 5" wheels (3-in-1)    Recommendations for Other Services     Frequency Min 3X/week    Precautions / Restrictions Precautions Precautions: Fall Required Braces or Orthoses: Other Brace/Splint Other Brace/Splint: Post-op shoe for OOB.   Restrictions Weight Bearing Restrictions: Yes RLE Weight Bearing: Weight bearing as tolerated   Pertinent Vitals/Pain Did not rate, but indicates very painful when placing in dependent position.  Premedicated prior to PT.        Mobility  Bed Mobility Bed Mobility: Supine to Sit;Sitting - Scoot to Edge of Bed Supine to Sit: 5: Supervision Sitting - Scoot to Edge of Bed: 5: Supervision Details for Bed Mobility Assistance: cues for safe technique, pt moves very slowly 2/2 pain.   Transfers Transfers: Sit to Stand;Stand to Sit Sit to Stand: 4: Min guard;With upper extremity assist;From bed Stand to Sit: 4: Min guard;With upper extremity assist;To chair/3-in-1;With armrests Details for Transfer Assistance: cues for use of UEs and controlling descent to chair.   Ambulation/Gait Ambulation/Gait Assistance: 4: Min guard Ambulation Distance (Feet): 150 Feet Assistive  device: Rolling walker Ambulation/Gait Assistance Details: cues for upright posture, use of RW.   Gait Pattern: Step-through pattern;Decreased stride length;Decreased step length - left;Decreased stance time - right;Trunk flexed Stairs: No Wheelchair Mobility Wheelchair Mobility: No    Exercises     PT Diagnosis: Difficulty walking;Acute pain  PT Problem List: Decreased strength;Decreased activity tolerance;Decreased balance;Decreased mobility;Decreased range of motion;Decreased knowledge of use of DME;Decreased knowledge of precautions;Pain PT Treatment Interventions: DME instruction;Gait training;Stair training;Functional mobility training;Therapeutic activities;Therapeutic exercise;Balance training;Patient/family education   PT Goals Acute Rehab PT Goals PT Goal Formulation: With patient Time For Goal Achievement: 12/21/12 Potential to Achieve Goals: Good Pt will go Supine/Side to Sit: with modified independence PT Goal: Supine/Side to Sit - Progress: Goal set today Pt will go Sit to Supine/Side: with modified independence PT Goal: Sit to Supine/Side - Progress: Goal set today Pt will go Sit to Stand: with modified independence PT Goal: Sit to Stand - Progress: Goal set today Pt will go Stand to Sit: with modified independence PT Goal: Stand to Sit - Progress: Goal set today Pt will Ambulate: >150 feet;with modified independence;with rolling walker PT Goal: Ambulate - Progress: Goal set today Pt will Go Up / Down Stairs: 3-5 stairs;with supervision;with least restrictive assistive device PT Goal: Up/Down Stairs - Progress: Goal set today  Visit Information  Last PT Received On: 12/14/12 Assistance Needed: +1    Subjective Data  Subjective: I knew this wasn't a sprained ankle.   Patient Stated Goal: Home   Prior Functioning  Home Living Lives With: Spouse;Son Available Help at Discharge: Family;Available  24 hours/day Type of Home: Mobile home Home Access: Stairs to  enter Entrance Stairs-Number of Steps: 3 Entrance Stairs-Rails: Left Home Layout: One level Bathroom Shower/Tub: Health visitor: Standard Home Adaptive Equipment: None Additional Comments: pt notes both of his sons have disabilities.   Prior Function Level of Independence: Independent Able to Take Stairs?: Yes Driving: Yes Communication Communication: No difficulties    Cognition  Cognition Overall Cognitive Status: Appears within functional limits for tasks assessed/performed Arousal/Alertness: Awake/alert Orientation Level: Appears intact for tasks assessed Behavior During Session: [facility] for tasks performed    Extremity/Trunk Assessment Right Lower Extremity Assessment RLE ROM/Strength/Tone: Deficits RLE ROM/Strength/Tone Deficits: Limited by pain.   RLE Sensation: WFL - Light Touch Left Lower Extremity Assessment LLE ROM/Strength/Tone: WFL for tasks assessed LLE Sensation: WFL - Light Touch Trunk Assessment Trunk Assessment: Normal   Balance Balance Balance Assessed: No  End of Session PT - End of Session Equipment Utilized During Treatment: Gait belt Activity Tolerance: Patient limited by pain Patient left: in chair;with call bell/phone within reach Nurse Communication: Mobility status  GP     Mark Christensen, [place] [phone] 12/14/2012, 11:29 AM

## 2012-12-15 ENCOUNTER — Encounter (HOSPITAL_COMMUNITY): Payer: Self-pay | Admitting: Orthopedic Surgery

## 2012-12-15 LAB — COMPREHENSIVE METABOLIC PANEL
Albumin: 3.2 g/dL — ABNORMAL LOW (ref 3.5–5.2)
Alkaline Phosphatase: 92 U/L (ref 39–117)
BUN: 11 mg/dL (ref 6–23)
CO2: 27 mEq/L (ref 19–32)
Chloride: 100 mEq/L (ref 96–112)
GFR calc non Af Amer: >90 mL/min (ref >90)
Potassium: 4.6 mEq/L (ref 3.5–5.1)
Total Bilirubin: 0.5 mg/dL (ref 0.3–1.2)

## 2012-12-15 MED ORDER — SODIUM CHLORIDE 0.9 % IJ SOLN
10.0000 mL | INTRAMUSCULAR | Status: DC | PRN
  Administered 2012-12-16: 10 mL

## 2012-12-15 NOTE — Progress Notes (Signed)
Physical Therapy Treatment Patient Details Name: Mark Christensen MRN: [id number] DOB: December 21, 1960 Today's Date: 12/15/2012 Time: 4098-1191 PT Time Calculation (min): 25 min  PT Assessment / Plan / Recommendation Comments on Treatment Session  Pt is a 52 y.o. male adm to [facility] secondary to increasing pain in R ankle; pt is s/p R I + D. Pt is limited in mobility and overall activity tolerance secondary to deconditioning and pain. Pt did not want to practice steps today secondary to pain 7/10, will need to address prior to D/C home. Plan to f/u for PT services. Recommendations for OPPT once cleared by MD stay appropriate.     Follow Up Recommendations  Outpatient PT;Supervision - Intermittent     Does the patient have the potential to tolerate intense rehabilitation     Barriers to Discharge        Equipment Recommendations  Rolling walker with 5" wheels    Recommendations for Other Services    Frequency Min 3X/week   Plan Discharge plan remains appropriate;Frequency remains appropriate    Precautions / Restrictions Precautions Precautions: Fall Required Braces or Orthoses: Other Brace/Splint Other Brace/Splint: Post-op shoe for OOB.   Restrictions Weight Bearing Restrictions: Yes RLE Weight Bearing: Weight bearing as tolerated   Pertinent Vitals/Pain 10/10 initially with movement; 7/10 at end of treatment session. Pt given ice pack for R LE to alleviate pain and reduce edema.     Mobility  Bed Mobility Bed Mobility: Supine to Sit;Sitting - Scoot to Edge of Bed Supine to Sit: 5: Supervision Sitting - Scoot to Edge of Bed: 5: Supervision Details for Bed Mobility Assistance: cues for hand placement and technique  Transfers Transfers: Sit to Stand;Stand to Sit Sit to Stand: 5: Supervision;From bed Stand to Sit: 5: Supervision;To bed Details for Transfer Assistance: cues for safety and use of UEs when sitting down  Ambulation/Gait Ambulation/Gait Assistance: 5:  Supervision Ambulation Distance (Feet): 200 Feet Assistive device: Rolling walker Ambulation/Gait Assistance Details: initially verbalized pain 10/10 with movement; with activity pain 7/10. cues for upright posture and gt sequencing. Gait Pattern: Step-through pattern;Decreased stride length;Decreased step length - left;Decreased stance time - right;Trunk flexed Gait velocity: decreased General Gait Details: pt deferred practiving steps today secondary to pain; will attempt in am session.  Stairs: No Wheelchair Mobility Wheelchair Mobility: No    Exercises     PT Diagnosis:    PT Problem List:   PT Treatment Interventions:     PT Goals Acute Rehab PT Goals PT Goal Formulation: With patient Time For Goal Achievement: 12/21/12 Potential to Achieve Goals: Good PT Goal: Supine/Side to Sit - Progress: Progressing toward goal PT Goal: Sit to Supine/Side - Progress: Progressing toward goal PT Goal: Sit to Stand - Progress: Progressing toward goal PT Goal: Stand to Sit - Progress: Progressing toward goal PT Goal: Ambulate - Progress: Progressing toward goal  Visit Information  Last PT Received On: 12/15/12 Assistance Needed: +1    Subjective Data  Subjective: I had to crawl out of the house to get here. I Patient Stated Goal: home   Cognition  Cognition Overall Cognitive Status: Appears within functional limits for tasks assessed/performed Arousal/Alertness: Awake/alert Orientation Level: Appears intact for tasks assessed Behavior During Session: [facility] for tasks performed    Balance     End of Session PT - End of Session Equipment Utilized During Treatment: Gait belt Activity Tolerance: Patient tolerated treatment well Patient left: in bed;with call bell/phone within reach Nurse Communication: Mobility status;Patient requests pain meds  GP     Shelva Majestic [place], [place] [phone] 12/15/2012, 2:26 PM

## 2012-12-15 NOTE — Progress Notes (Signed)
Peripherally Inserted Central Catheter/Midline Placement  The IV Nurse has discussed with the patient and/or persons authorized to consent for the patient, the purpose of this procedure and the potential benefits and risks involved with this procedure.  The benefits include less needle sticks, lab draws from the catheter and patient may be discharged home with the catheter.  Risks include, but not limited to, infection, bleeding, blood clot (thrombus formation), and puncture of an artery; nerve damage and irregular heat beat.  Alternatives to this procedure were also discussed.  PICC/Midline Placement Documentation        Mark Christensen 12/15/2012, 12:42 PM

## 2012-12-15 NOTE — Progress Notes (Signed)
Utilization review completed. Denetria Luevanos, RN, BSN. 

## 2012-12-15 NOTE — Op Note (Signed)
NAMEJA, [place] NO.:  [id number]  MEDICAL RECORD NO.:  [id number]  LOCATION:                                 FACILITY:  PHYSICIAN:  Madlyn Frankel. Charlann Boxer, M.D.  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  PROCEDURE:  I and D of right ankle with an open arthrotomy as this would be an open incisional, followed by irrigation of the right ankle joint.  SURGEON:  Shelda Pal, MD.  ASSISTANT:  Surgical team.  ANESTHESIA:  General.  SPECIMENS:  There was a significant amount of purulent material that was inside his joint.  Cultures were taken and sent to pathology as well as a syringe of 5 mL of purulent material sent to pathology for cell count, Gram stain culture and crystal.  DRAINS:  None.  COMPLICATIONS:  None.  TOURNIQUET:  The Esmarch was used as a tourniquet up for approximately 10-12 minutes.  INDICATIONS FOR PROCEDURE:  This patient is a 52 year old male who presented to the emergency room with progressively worsening right ankle pain over the past week.  He got to the point where he was unable to bear weight.  He was seen and evaluated in the emergency room, noted to have a very tender right ankle with erythematous changes laterally. Pain with passive motion.  It was quite exquisite unable to be maintained with pain control on pain meds in the ER the patient is on chronic oxycodone for history of cirrhosis and hepatitis B and C.  He is currently disabled.  I reviewed with him this current situation. I based on the amount of pain.  He had on exam clinically I do not think that he should did this warranted observation and IV antibiotics.  The option was to aspirate his joint for diagnostic purposes but there  was enough fullness on exam and I felt that we should just plan to operatively I and D, as the joint.  Risks and benefits of the procedure including risks of recurrent infection in adequate debridement recurrence of infection on superficial numbness around the incision site.  Consent was obtained for benefit of debridement of the ankle joint.  PROCEDURE IN DETAIL:  The patient was brought to operative theater. Once adequate anesthesia, preoperative antibiotics were held until incision and plan for using 1.5 g of vancomycin.  The patient was positioned supine.  A thin 15-drape was placed just proximal in the his knee than his leg from the toes to the in October, 2015 drapes were draped out sterilely.  A time-out was performed identifying the patient, planned procedure, extremity.  Jillyn Hidden given the lateral erythematous changes that were reviewed with the patient preoperatively location of the planned incision which was to be anterior to the medial malleolus.  This area was marked and the Esmarch was applied to the leg and an incision then made.  Cauterization was used to take care of.  Superficial bleeding.  The saphenous vein was identified  and retracted medially.  The joint capsule tissue was identified and arthrotomy made sharply with immediate release of a large amount of purulent fluid above.  Cultures were taken.  Given the volume of fluid is present.  I did use a 10-mL syringe and removed another 4-5 cm as purulent material sent this off for cell count as well as crystal evaluation in addition Gram stain culture.  Following the removal of this bluntly as well as directly with suction.  I then irrigated the ankle joint out with 1 L normal saline solution using a bulb the irrigating solution syringe.  Following this, I decided to leave the arthrotomy open and non-close it with the suture.  I then anchored the stay closed subcutaneous layer with 2-0 Vicryl, loosely and then used  2-0 nylon on the skin.  The tourniquet, the Esmarch was removed.  The tourniquet hemostasis obtained prior to closure. Once the wound was closed.  I cleaned the skin and dressed it with Xeroform and gauze and Mepilex dressing.  Then wrapped his ankle in Ace wrap.  Patient.  He tolerated the procedure well.  We will see him back in the office.  He will be in the hospital for a Mueller from 24-72 hours.  For identification of infection he will currently remain on IV vancomycin until we have a better idea of culture evaluation.     Madlyn Frankel Charlann Boxer, M.D.     MDO/MEDQ  D:  12/13/2012  T:  12/13/2012  Job:  [id number]

## 2012-12-15 NOTE — Progress Notes (Signed)
Patient ID: Mark Christensen, male   DOB: March 24, 1961, 52 y.o.   MRN: [id number] Subjective: 2 Days Post-Op Procedure(s) (LRB): IRRIGATION AND DEBRIDEMENT EXTREMITY (Right)    Patient reports pain as moderate.  Does not tolerate pain at all, weepy with any activity involving right ankle Though states ankle far better than before the operation  Objective:   VITALS:   Filed Vitals:   12/15/12 0656  BP: 139/86  Pulse: 77  Temp: 98.2 F (36.8 C)  Resp: 18    Neurovascular intact Incision: dressing C/D/I  LABS  Recent Labs  12/13/12 1142  HGB 14.5  HCT 40.9  WBC 5.7  PLT 108*     Recent Labs  12/13/12 1142 12/14/12 0525 12/15/12 0655  NA 135 138 136  K 3.8 4.2 4.6  BUN 9 10 11   CREATININE 0.67 0.69 0.77  GLUCOSE 96 99 97     Recent Labs  12/13/12 1142  INR 1.05     Assessment/Plan: 2 Days Post-Op Procedure(s) (LRB): IRRIGATION AND DEBRIDEMENT EXTREMITY (Right)   Continue ABX therapy due to suspected septic ankle joint Will order PIC for 4 weeks IV antibiotics, Vancomycin Activity as tolerated otherwise

## 2012-12-16 LAB — VANCOMYCIN, TROUGH: Vancomycin Tr: 37.4 ug/mL (ref 10.0–20.0)

## 2012-12-16 MED ORDER — OXYCODONE HCL 5 MG PO TABS: 5.0000 mg | ORAL_TABLET | ORAL | Status: DC | PRN

## 2012-12-16 MED ORDER — HEPARIN SOD (PORK) LOCK FLUSH 100 UNIT/ML IV SOLN
250.0000 [IU] | INTRAVENOUS | Status: AC | PRN
  Administered 2012-12-16: 500 [IU]

## 2012-12-16 MED ORDER — VANCOMYCIN HCL IN DEXTROSE 1-5 GM/200ML-% IV SOLN: 1000.0000 mg | Freq: Two times a day (BID) | INTRAVENOUS | Status: DC

## 2012-12-16 MED ORDER — POLYETHYLENE GLYCOL 3350 17 G PO PACK: 17.0000 g | PACK | Freq: Every day | ORAL | Status: DC | PRN

## 2012-12-16 MED ORDER — DSS 100 MG PO CAPS: 100.0000 mg | ORAL_CAPSULE | Freq: Two times a day (BID) | ORAL | Status: DC

## 2012-12-16 NOTE — Care Management Note (Signed)
CARE MANAGEMENT NOTE 12/16/2012  Patient:  GRAESYN, SCHREIFELS   Account Number:  [id number]  Date Initiated:  12/16/2012  Documentation initiated by:  Vance Peper  Subjective/Objective Assessment:   52 yr old male s/p I & D of right ankle     Action/Plan:   patient will need IV Vanc for 4 weeks. Has picc line.   Anticipated DC Date:  12/16/2012   Anticipated DC Plan:  HOME W HOME HEALTH SERVICES      DC Planning Services  CM consult      [facility] Choice  HOME HEALTH   Choice offered to / List presented to:     DME arranged  Levan Hurst      DME agency  [facility]     [facility] arranged  HH-1 RN  IV Antibiotics      HH agency  [facility]   Status of service:  Completed, signed off Medicare Important Message given?   (If response is "NO", the following Medicare IM given date fields will be blank) Date Medicare IM given:   Date Additional Medicare IM given:    Discharge Disposition:  HOME W HOME HEALTH SERVICES  Per UR Regulation:    If discussed at Long Length of Stay Meetings, dates discussed:    Comments:

## 2012-12-16 NOTE — Progress Notes (Signed)
Physical Therapy Treatment Patient Details Name: Thomes Burak MRN: [id number] DOB: 10-03-1960 Today's Date: 12/16/2012 Time: 4098-1191 PT Time Calculation (min): 28 min  PT Assessment / Plan / Recommendation Comments on Treatment Session  Pt agreeable to therapy today with max encouragement. Pt verbalizes that his pain is most intense when he intially stands and when he stands for long periods at a time. Pt seemed very upset with pain medication and reported he was very upset with not being D/C this morning. Continue to recommend OPPT once cleared by MD.     Follow Up Recommendations  Outpatient PT;Supervision - Intermittent     Does the patient have the potential to tolerate intense rehabilitation     Barriers to Discharge        Equipment Recommendations  Rolling walker with 5" wheels    Recommendations for Other Services    Frequency Min 3X/week   Plan Discharge plan remains appropriate;Frequency remains appropriate    Precautions / Restrictions Precautions Precautions: Fall Required Braces or Orthoses: Other Brace/Splint Other Brace/Splint: Post-op shoe for OOB.   Restrictions Weight Bearing Restrictions: Yes RLE Weight Bearing: Weight bearing as tolerated   Pertinent Vitals/Pain initially 10/10 pain with sit to stand; pt reports pain "eases with walking". Premedicated.     Mobility  Bed Mobility Bed Mobility: Supine to Sit;Sitting - Scoot to Edge of Bed Supine to Sit: 5: Supervision Sitting - Scoot to Edge of Bed: 5: Supervision Details for Bed Mobility Assistance: cues for safety and hand placement Transfers Transfers: Sit to Stand;Stand to Sit Sit to Stand: 5: Supervision;From bed Stand to Sit: 5: Supervision;To bed Details for Transfer Assistance: cues for safety and to control descent to bed; has a tendency to be impulsive and unsafe with transfers Ambulation/Gait Ambulation/Gait Assistance: 5: Supervision Ambulation Distance (Feet): 125 Feet Assistive  device: Rolling walker Ambulation/Gait Assistance Details: was in 10/10 pain with initial standing; said the pain went down as he began to walk and bear weight through R LE. Cues for upright posture and RW safety  Gait Pattern: Step-through pattern;Decreased stride length;Decreased step length - left;Decreased stance time - right;Trunk flexed Gait velocity: decreased Stairs: Yes Stairs Assistance: 4: Min guard Stair Management Technique: One rail Right;Step to pattern Number of Stairs: 2 Wheelchair Mobility Wheelchair Mobility: No    Exercises     PT Diagnosis:    PT Problem List:   PT Treatment Interventions:     PT Goals Acute Rehab PT Goals PT Goal Formulation: With patient Time For Goal Achievement: 12/21/12 Potential to Achieve Goals: Good PT Goal: Supine/Side to Sit - Progress: Progressing toward goal PT Goal: Sit to Supine/Side - Progress: Progressing toward goal PT Goal: Sit to Stand - Progress: Progressing toward goal PT Goal: Stand to Sit - Progress: Progressing toward goal PT Goal: Ambulate - Progress: Progressing toward goal PT Goal: Up/Down Stairs - Progress: Progressing toward goal  Visit Information  Last PT Received On: 12/16/12 Assistance Needed: +1    Subjective Data  Subjective: These people are idiots; I need pain meds when i need pain meds. Im going to work with you  Patient Stated Goal: home   Cognition  Cognition Overall Cognitive Status: Appears within functional limits for tasks assessed/performed Arousal/Alertness: Awake/alert Orientation Level: Appears intact for tasks assessed Behavior During Session: [facility] for tasks performed    Balance  Balance Balance Assessed: No  End of Session PT - End of Session Equipment Utilized During Treatment: Gait belt Activity Tolerance: Patient tolerated treatment well Patient  left: in bed;with call bell/phone within reach;with nursing in room Nurse Communication: Mobility status;Patient requests pain meds    GP     Donell Sievert, [place] [phone] 12/16/2012, 11:38 AM

## 2012-12-16 NOTE — ED Provider Notes (Signed)
History     CSN: [id number]  Arrival date & time 11/25/12  1542   First MD Initiated Contact with Patient 11/25/12 1615      Chief Complaint  Patient presents with  . Abdominal Pain    (Consider location/radiation/quality/duration/timing/severity/associated sxs/prior treatment) Patient is a 52 y.o. male presenting with abdominal pain. The history is provided by the patient. No language interpreter was used.  Abdominal Pain Pain location:  RUQ Pain quality: aching and sharp   Pain radiates to:  Epigastric region and RUQ Pain severity:  Severe Onset quality:  Unable to specify Duration: months to year. Progression:  Waxing and waning Chronicity:  Chronic Context: recent illness (was admitted 2/24-2/27 for upper GI bleed and had variseal banding)   Context: not diet changes, not eating, not medication withdrawal, not sick contacts, not suspicious food intake and not trauma   Relieved by:  Nothing Worsened by:  Nothing tried Ineffective treatments:  None tried Associated symptoms: nausea and vomiting   Associated symptoms: no chills, no diarrhea, no dysuria, no fatigue, no fever, no shortness of breath and no sore throat   Nausea:    Severity:  Moderate   Onset quality:  Unable to specify   Timing:  Intermittent Vomiting:    Quality:  Undigested food   Severity:  Moderate   Duration:  1 day   Timing:  Intermittent   Progression:  Unchanged Risk factors: alcohol abuse (remote) and recent hospitalization (for variceal bleed 2/24/'14)     Past Medical History  Diagnosis Date  . Hepatic cirrhosis   . Hypertension     NO LONGER TAKING B/P MEDS--STATES DISCONTINUED BY HIS DOCTOR  . Anxiety   . Shortness of breath   . GERD (gastroesophageal reflux disease)   . Restless leg syndrome   . Complication of anesthesia     ENDO/COLONOSCOPY ATTEMPTED IN THE PAST-UNABLE TO ADEQUATELY SEDATE PT-TOLD WOULD NEED ANESTHESIA FOR FUTURE PROCEDURES  . Pneumonia 09-2012  . Headache      HX OF MIGRAINES  . Arthritis     hands  . Hepatitis B   . Hepatitis C     Past Surgical History  Procedure Laterality Date  . Appendectomy    . Esophageal banding      FOR TX OF GI BLEED ABOUT 2008  . Arm debridement  08-11-12    right forearm wound with debridement 2 months ago/ right abdomen also-healing well  . Multiple tooth extractions  08/12/2012  . Colonoscopy with propofol  08/13/2012    Procedure: COLONOSCOPY WITH PROPOFOL;  Surgeon: Willis Modena, MD;  Location: [facility] ENDOSCOPY;  Service: Endoscopy;  Laterality: N/A;  . 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 Esophagogastroduodenoscopy N/A 11/18/2012    Procedure: ESOPHAGOGASTRODUODENOSCOPY (EGD);  Surgeon: Shirley Friar, MD;  Location: [facility] ENDOSCOPY;  Service: Endoscopy;  Laterality: N/A;  . Esophageal banding  11/18/2012    Procedure: ESOPHAGEAL BANDING;  Surgeon: Shirley Friar, MD;  Location: [facility] ENDOSCOPY;  Service: Endoscopy;;  . I&d extremity Right 12/13/2012    Procedure: IRRIGATION AND DEBRIDEMENT EXTREMITY;  Surgeon: Shelda Pal, MD;  Location: [facility] OR;  Service: Orthopedics;  Laterality: Right;    No family history on file.  History  Substance Use Topics  . Smoking status: Current Every Day Smoker -- 0.50 packs/day for 30 years    Types: Cigarettes  . Smokeless tobacco: Never Used  . Alcohol Use: No  Comment: RARELY USES ALCOHOL      Review of Systems  Constitutional: Negative for fever, chills, diaphoresis, activity change, appetite change and fatigue.  HENT: Negative for congestion, sore throat, facial swelling, rhinorrhea, drooling, neck pain and voice change.   Respiratory: Negative for shortness of breath and stridor.   Gastrointestinal: Positive for nausea, vomiting and abdominal pain. Negative for diarrhea and abdominal distention.  Endocrine: Negative for polydipsia and polyuria.  Genitourinary: Negative for dysuria, urgency, frequency and decreased urine  volume.  Musculoskeletal: Negative for back pain and gait problem.  Skin: Negative for color change and wound.  Neurological: Negative for facial asymmetry, weakness, numbness and headaches.  Hematological: Does not bruise/bleed easily.  Psychiatric/Behavioral: Negative for confusion and agitation.    Allergies  Tylenol; Morphine and related; and Tramadol  Home Medications   Current Outpatient Rx  Name  Route  Sig  Dispense  Refill  . albuterol (PROVENTIL HFA;VENTOLIN HFA) 108 (90 BASE) MCG/ACT inhaler   Inhalation   Inhale 1-2 puffs into the lungs every 4 (four) hours as needed for wheezing.         Marland Kitchen amitriptyline (ELAVIL) 25 MG tablet   Oral   Take 25 mg by mouth at bedtime.         Marland Kitchen rOPINIRole (REQUIP) 3 MG tablet   Oral   Take 3 mg by mouth at bedtime.         . docusate sodium 100 MG CAPS   Oral   Take 100 mg by mouth 2 (two) times daily.   10 capsule      . ondansetron (ZOFRAN) 4 MG tablet   Oral   Take 1 tablet (4 mg total) by mouth every 8 (eight) hours as needed for nausea.   20 tablet   0   . oxyCODONE (ROXICODONE) 5 MG immediate release tablet   Oral   Take 1-3 tablets (5-15 mg total) by mouth every 4 (four) hours as needed for pain.   120 tablet   0   . polyethylene glycol (MIRALAX / GLYCOLAX) packet   Oral   Take 17 g by mouth daily as needed.   14 each      . vancomycin (VANCOCIN) 1 GM/200ML SOLN   Intravenous   Inject 200 mLs (1,000 mg total) into the vein every 12 (twelve) hours.   11200 mL   0     To be given for 4 weeks. Weekly trough levels to  ...     BP 112/78  Pulse 72  Temp(Src) 98.3 F (36.8 C) (Oral)  Resp 20  SpO2 98%  Physical Exam  Constitutional: He is oriented to person, place, and time. He appears well-developed and well-nourished. No distress.  HENT:  Head: Normocephalic and atraumatic.  Mouth/Throat: No oropharyngeal exudate.  Eyes: Pupils are equal, round, and reactive to light.  Neck: Normal range of  motion. Neck supple.  Cardiovascular: Normal rate, regular rhythm and normal heart sounds.  Exam reveals no gallop and no friction rub.   No murmur heard. Pulmonary/Chest: Effort normal and breath sounds normal. No respiratory distress. He has no wheezes. He has no rales.  Abdominal: Soft. Bowel sounds are normal. He exhibits no distension and no mass. There is tenderness in the right upper quadrant. There is no rigidity, no rebound and no guarding.  Musculoskeletal: Normal range of motion. He exhibits no edema and no tenderness.  Neurological: He is alert and oriented to person, place, and time.  Skin: Skin is warm  and dry.  Psychiatric: He has a normal mood and affect.    ED Course  Procedures (including critical care time)  Labs Reviewed  COMPREHENSIVE METABOLIC PANEL - Abnormal; Notable for the following:    Sodium 134 (*)    Albumin 3.3 (*)    All other components within normal limits  CBC WITH DIFFERENTIAL - Abnormal; Notable for the following:    Platelets 99 (*)    All other components within normal limits  LIPASE, BLOOD   No results found.   1. Abdominal pain, chronic, right upper quadrant   2. Nausea & vomiting     Comprehensive metabolic panel (Final result) Abnormal Component (Lab Inquiry)      Result Time NA K CL CO2 GLUCOSE    11/25/12 18:07:06 134 (L) 3.9 99 24 92         Result Time BUN Creatinine, Ser CALCIUM PROTEIN Albumin    11/25/12 18:07:06 11 0.65 9.8 8.3 3.3 (L)         Result Time AST ALT ALK PHOS BILI TOTL GFR calc non Af Amer    11/25/12 18:07:06 32 25 99 0.5 >90         Result Time GFR calc Af Amer    11/25/12 18:07:06 >90        The eGFR has been calculated using the CKD EPI equation. This calculation has not been validated in all clinical situations. eGFR's persistently <90 mL/min signify possible Chronic Kidney Disease.              Lipase, blood (Final result)   Component (Lab Inquiry)      Result Time LIPASE    11/25/12 18:07:06 41               CBC with Differential (Final result) Abnormal Component (Lab Inquiry)      Result Time WBC RBC HGB HCT MCV    11/25/12 17:45:48 4.6 4.78 14.7 41.6 87.0         Result Time MCH MCHC RDW PLT NEUTRO PCT    11/25/12 17:45:48 30.8 35.3 14.3 99 (L) CONSISTENT WITH PREVIOUS RESULT 62         Result Time AB NEUTRO LYMPHO PCT AB LYM MONO PCT MONO ABS    11/25/12 17:45:48 2.8 28 1.3 9 0.4         Result Time EOS PCT EOSINO ABS BASOS PCT BASOS ABS    11/25/12 17:45:48 2 0.1 0 0.0             MDM   Pt is a 52 y.o. male with pertinent PMHX of hepatic cirrhosis, HTN, anxiety, GERD, RLS, w/ recent admission 2/14-2/27 for variceal bleed w/ banding who presents with chronic RUQ pain.  He states he thinks it's because his liver is big. Pain similar in locaton & quality of pain and is frequently accompanied by nausea and vomiting.  Denies hematemesis, melanonic or tarry stool, and is having nml BM's.  VSS, pt in NAD, well-hydrated appearing on exm.  +ttp epigastrium & RUQ w/o rebound or guarding.  CBC w/ stable plt count, lipase not elevated, CMP unremakrable except for slight hyponatremia and albuminemia.  Given chronic nature of pain, I do not feel an acute cause of ab pain is likely including SBO, cholecystitis, mesenteric ischemia or pancreatitis .  Pt is followed by GI who may continue w/u for his chronic RUQ pain. Pt treated symptomatically here w/ IV dilaudid & zofran w/ improvement of symptoms.  1. Abdominal pain, chronic, right upper quadrant   2. Nausea & vomiting      Labs and imaging considered in decision making, reviewed by myself.   Pt care discussed with my attending, Dr. Freida Busman.         Toy Cookey, MD 12/16/12 2121

## 2012-12-16 NOTE — Progress Notes (Signed)
   Subjective: 3 Days Post-Op Procedure(s) (LRB): IRRIGATION AND DEBRIDEMENT EXTREMITY (Right)   Patient reports pain as moderate, states that he is a little over time to to get pain medication. He feels that he may have over done the activity yesterday, causing more pain today. No other events throughout the night. Ready to go home.  Objective:   VITALS:   Filed Vitals:   12/16/12 0544  BP: 113/62  Pulse: 71  Temp: 98.5 F (36.9 C)  Resp: 16    Neurovascular intact Incision: dressing C/D/I  LABS  Recent Labs  12/14/12 0525 12/15/12 0655  NA 138 136  K 4.2 4.6  BUN 10 11  CREATININE 0.69 0.77  GLUCOSE 99 97     Assessment/Plan: 3 Days Post-Op Procedure(s) (LRB): IRRIGATION AND DEBRIDEMENT EXTREMITY (Right) Up with therapy Discharge home with home health Vancomycin for 4 weeks, weekly troughs to maintain Vancomycin trough level 15-20 mcg/ml  HHRN to do the weekly draws and pharmacy to adjust WBAT Follow up in 2 weeks at [facility]. Follow up with OLIN,Zamere Pasternak D in 2 weeks.  Contact information:  [facility] [address], [address] [place] [address] [phone]        Mark Christensen. Mark Christensen   PAC  12/16/2012, 1:13 PM

## 2012-12-16 NOTE — Progress Notes (Signed)
Patient discharged in stable condition via wheelchair to home. Discharge instructions and prescriptions were given and explained. 

## 2012-12-16 NOTE — Progress Notes (Signed)
ANTIBIOTIC CONSULT NOTE - FOLLOW UP  Pharmacy Consult for Vancomycin Indication: Suspected septic joint  Allergies  Allergen Reactions  . Tylenol (Acetaminophen) Other (See Comments)    Cirrhosis  . Morphine And Related Itching    itching  . Tramadol Nausea And Vomiting and Swelling    Abdominal swelling     Patient Measurements: Height: 6\' 4"  (193 cm) Weight: 260 lb (117.935 kg) IBW/kg (Calculated) : 86.8 Adjusted Body Weight:   Vital Signs: Temp: 98.5 F (36.9 C) (03/25 0544) Temp src: Oral (03/25 0544) BP: 113/62 mmHg (03/25 0544) Pulse Rate: 71 (03/25 0544) Intake/Output from previous day: 03/24 0701 - 03/25 0700 In: 5045 [P.O.:840; I.V.:3405; IV Piggyback:800] Out: 2350 [Urine:2350] Intake/Output from this shift: Total I/O In: -  Out: 250 [Urine:250]  Labs:  Recent Labs  12/13/12 1142 12/14/12 0525 12/15/12 0655  WBC 5.7  --   --   HGB 14.5  --   --   PLT 108*  --   --   CREATININE 0.67 0.69 0.77   Estimated Creatinine Clearance: 156.7 ml/min (by C-G formula based on Cr of 0.77).  Recent Labs  12/16/12 0804  VANCOTROUGH 37.4*     Microbiology: Recent Results (from the past 720 hour(s))  MRSA PCR SCREENING     Status: Abnormal   Collection Time    11/18/12  1:34 AM      Result Value Range Status   MRSA by PCR POSITIVE (*) NEGATIVE Final   Comment:            The GeneXpert MRSA Assay (FDA     approved for NASAL specimens     only), is one component of a     comprehensive MRSA colonization     surveillance program. It is not     intended to diagnose MRSA     infection nor to guide or     monitor treatment for     MRSA infections.     RESULT CALLED TO, READ BACK BY AND VERIFIED WITH:     CALLED TO RN MATT YORK [phone] @0623  THANEY  CULTURE, ROUTINE-ABSCESS     Status: None   Collection Time    12/13/12  8:37 PM      Result Value Range Status   Specimen Description ABSCESS   Final   Special Requests RIGHT MEDIAL ANKLE   Final   Gram  Stain     Final   Value: FEW WBC PRESENT, PREDOMINANTLY PMN     FEW SQUAMOUS EPITHELIAL CELLS PRESENT     NO ORGANISMS SEEN     Performed at [facility]   Culture NO GROWTH 2 DAYS   Final   Report Status PENDING   Incomplete  GRAM STAIN     Status: None   Collection Time    12/13/12  8:37 PM      Result Value Range Status   Specimen Description ABSCESS   Final   Special Requests RIGHT MEDIAL ANKLE   Final   Gram Stain     Final   Value: FEW WBC PRESENT, PREDOMINANTLY PMN     NO ORGANISMS SEEN   Report Status 12/13/2012 FINAL   Final  ANAEROBIC CULTURE     Status: None   Collection Time    12/13/12  8:37 PM      Result Value Range Status   Specimen Description ABSCESS   Final   Special Requests RIGHT MEDIAL ANKLE   Final   Gram Stain  Final   Value: FEW WBC PRESENT, PREDOMINANTLY PMN     NO SQUAMOUS EPITHELIAL CELLS SEEN     NO ORGANISMS SEEN   Culture     Final   Value: NO ANAEROBES ISOLATED; CULTURE IN PROGRESS FOR 5 DAYS   Report Status PENDING   Incomplete  MRSA PCR SCREENING     Status: None   Collection Time    12/14/12  9:07 AM      Result Value Range Status   MRSA by PCR NEGATIVE  NEGATIVE Final   Comment:            The GeneXpert MRSA Assay (FDA     approved for NASAL specimens     only), is one component of a     comprehensive MRSA colonization     surveillance program. It is not     intended to diagnose MRSA     infection nor to guide or     monitor treatment for     MRSA infections.    Anti-infectives   Start     Dose/Rate Route Frequency Ordered Stop   12/16/12 0000  vancomycin (VANCOCIN) 1 GM/200ML SOLN    Comments:  To be given for 4 weeks. Weekly trough levels to be drawn and pharmacy to adjust to obtain trough level 15-20 mcg/ml.   1,000 mg 200 mL/hr over 60 Minutes Intravenous Every 12 hours 12/16/12 0726     12/14/12 0800  vancomycin (VANCOCIN) IVPB 1000 mg/200 mL premix     1,000 mg 200 mL/hr over 60 Minutes Intravenous Every 12  hours 12/13/12 2138        Assessment: 51yom s/p I&D on Vancomycin Day 3 for suspected septic joint. Patient has remained afebrile, WBC wnl and no culture results. Attempted to obtain Vancomycin trough this AM to aid in discharge planning. Vancomycin level was reported as supratherapetuic (37.4 mcg/ml) but was drawn 40 minutes after the Vancomycin infusion was started making the level invalid. Discussed with Ortho PA Babish - with discharge plans for this afternoon, recommended to recheck Vancomycin trough in 1-2 days as well as weekly during therapy (planning for 4 weeks of Vancomycin).  - SCr 0.77, CrCl > 100 ml/min  Goal of Therapy:  Vancomycin trough level 15-20 mcg/ml  Plan:  1. Continue Vancomycin 1g IV q12h - if discharged, recheck Vancomycin trough and SCr in 1-2 days and weekly thereafter   Cleon Dew [phone] 12/16/2012,11:11 AM

## 2012-12-17 LAB — CULTURE, ROUTINE-ABSCESS: Culture: NO GROWTH

## 2012-12-18 LAB — ANAEROBIC CULTURE

## 2012-12-18 NOTE — Discharge Summary (Signed)
Physician Discharge Summary  Patient ID: Mark Christensen MRN: [id number] DOB/AGE: 1961/09/22 52 y.o.  Admit date: 12/13/2012 Discharge date: 12/16/2012   Procedures:  Procedure(s) (LRB): IRRIGATION AND DEBRIDEMENT EXTREMITY (Right)  Attending Physician:  Dr. 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   S/P I&D of right ankle Diagnosis  . Hepatic cirrhosis  . Hypertension  . Anxiety  . Shortness of breath  . GERD (gastroesophageal reflux disease)  . Restless leg syndrome  . Complication of anesthesia  . Pneumonia  . Headache  . Arthritis  . Hepatitis B  . Hepatitis C    HPI: Mark Christensen is a 52 y.o. male with a hx of hepatic cirrhosis, GERD, esophageal varices, hypertension, anxiety presents to the Emergency Department complaining of gradual, persistent, progressively worsening right ankle pain and swelling onset 4 days ago. Associated symptoms include fever to 102, chills, night sweats, gait disturbance secondary to pain, ankle swelling, ankle erythema. Nothing makes it better and induration, palpation, movement makes it worse. Pt denies headache, neck pain, neck stiffness, chest pain, shortness of breath abdominal pain, nausea, vomiting, diarrhea, weakness, dizziness, syncope, dysuria, penile discharge, hematuria. Patient denies a history of gout, IV drug use. He endorses a history of necrotizing fasciitis of the right arm approximately 2 years ago. He was recently hospitalized for esophageal bleed.  PCP: Jeri Cos, MD   Discharged Condition: good  Hospital Course:  Patient underwent the above stated procedure on 12/13/2012. Patient tolerated the procedure well and brought to the recovery room in good condition and subsequently to the floor.  POD #1 BP: 149/85 ; Pulse: 74 ; Temp: 98 F (37 C) ; Resp:  18 Pt's foley was removed, as well as the hemovac drain removed. IV was changed to a saline lock. Ankle feeling better than before surgery, was able to walk around unit Right ankle dressings dry. Fair ankle and digital motion. Cx NGSF, no organisms seen on gram stain.  LABS  Basename  12/13/12    1142   HGB  14.5  HCT  40.9   POD #2  BP: 139/86 ; Pulse: 77 ; Temp: 98.2 F (36.8 C) ; Resp: 18  Patient reports pain as moderate. Does not tolerate pain at all, weepy with any activity involving right ankle though states ankle is far better than before the operation. Neurovascular intact and incision: dressing C/D/I  POD #3  BP: 113/62 ; Pulse: 71 ; Temp: 98.5 F (36.9 C) ; Resp: 16  Patient reports pain as moderate, states that he is a little over time to to get pain medication. He feels that he may have over done the activity yesterday, causing more pain today. No other events throughout the night. Ready to go home. Neurovascular intact and incision: dressing C/D/I   Discharge Exam: General appearance: alert, cooperative and no distress Extremities: Homans sign is negative, no sign of DVT and no edema, redness or tenderness in the calves or thighs  Disposition:   Home-Health Care Svc with follow up in 2 weeks   Follow-up Information   Follow up with Shelda Pal, MD. Schedule an appointment as soon as possible for a visit in 2 weeks.   Contact information:   [address] [place] [address] [phone]       Discharge Orders   Future Orders Complete By Expires  Call MD / Call 911  As directed     Comments:      If you experience chest pain or shortness of breath, CALL 911 and be transported to the hospital emergency room.  If you develope a fever above 101 F, pus (white drainage) or increased drainage or redness at the wound, or calf pain, call your surgeon's office.    Constipation Prevention  As directed     Comments:      Drink plenty of fluids.   Prune juice may be helpful.  You may use a stool softener, such as Colace (over the counter) 100 mg twice a day.  Use MiraLax (over the counter) for constipation as needed.    Diet - low sodium heart healthy  As directed     Discharge instructions  As directed     Comments:      Daily dressing changes with gauze and tape. Keep the area dry and clean until follow up. Follow up in 2 weeks at [facility]. Call with any questions or concerns. Rest, ice and elevate the leg to help reduce swelling / pain.    Increase activity slowly as tolerated  As directed     Weight bearing as tolerated  As directed          Medication List    TAKE these medications       albuterol 108 (90 BASE) MCG/ACT inhaler  Commonly known as:  PROVENTIL HFA;VENTOLIN HFA  Inhale 1-2 puffs into the lungs every 4 (four) hours as needed for wheezing.     amitriptyline 25 MG tablet  Commonly known as:  ELAVIL  Take 25 mg by mouth at bedtime.     DSS 100 MG Caps  Take 100 mg by mouth 2 (two) times daily.     ondansetron 4 MG tablet  Commonly known as:  ZOFRAN  Take 1 tablet (4 mg total) by mouth every 8 (eight) hours as needed for nausea.     oxyCODONE 5 MG immediate release tablet  Commonly known as:  ROXICODONE  Take 1-3 tablets (5-15 mg total) by mouth every 4 (four) hours as needed for pain.     polyethylene glycol packet  Commonly known as:  MIRALAX / GLYCOLAX  Take 17 g by mouth daily as needed.     rOPINIRole 3 MG tablet  Commonly known as:  REQUIP  Take 3 mg by mouth at bedtime.     vancomycin 1 GM/200ML Soln  Commonly known as:  VANCOCIN  Inject 200 mLs (1,000 mg total) into the vein every 12 (twelve) hours.         Signed: Anastasio Auerbach. Kevin Mario   PAC  12/18/2012, 9:18 PM

## 2012-12-19 NOTE — ED Provider Notes (Signed)
.  I saw and evaluated the patient, reviewed the resident's note and I agree with the findings and plan.  Toy Baker, MD 12/19/12 [phone]

## 2012-12-27 ENCOUNTER — Emergency Department (HOSPITAL_COMMUNITY)
Admission: EM | Admit: 2012-12-27 | Discharge: 2012-12-27 | Disposition: A | Discharge status: Home / Self Care | Payer: Medicaid Other | Attending: Emergency Medicine | Admitting: Emergency Medicine

## 2012-12-27 ENCOUNTER — Encounter (HOSPITAL_COMMUNITY): Payer: Self-pay | Admitting: Emergency Medicine

## 2012-12-27 DIAGNOSIS — I10 Essential (primary) hypertension: Secondary | ICD-10-CM | POA: Insufficient documentation

## 2012-12-27 DIAGNOSIS — Z8619 Personal history of other infectious and parasitic diseases: Secondary | ICD-10-CM | POA: Insufficient documentation

## 2012-12-27 DIAGNOSIS — R112 Nausea with vomiting, unspecified: Secondary | ICD-10-CM

## 2012-12-27 DIAGNOSIS — G43909 Migraine, unspecified, not intractable, without status migrainosus: Secondary | ICD-10-CM | POA: Insufficient documentation

## 2012-12-27 DIAGNOSIS — Z8701 Personal history of pneumonia (recurrent): Secondary | ICD-10-CM | POA: Insufficient documentation

## 2012-12-27 DIAGNOSIS — F411 Generalized anxiety disorder: Secondary | ICD-10-CM | POA: Insufficient documentation

## 2012-12-27 DIAGNOSIS — G8918 Other acute postprocedural pain: Secondary | ICD-10-CM | POA: Insufficient documentation

## 2012-12-27 DIAGNOSIS — F17200 Nicotine dependence, unspecified, uncomplicated: Secondary | ICD-10-CM | POA: Insufficient documentation

## 2012-12-27 DIAGNOSIS — Z8719 Personal history of other diseases of the digestive system: Secondary | ICD-10-CM | POA: Insufficient documentation

## 2012-12-27 DIAGNOSIS — Z8739 Personal history of other diseases of the musculoskeletal system and connective tissue: Secondary | ICD-10-CM | POA: Insufficient documentation

## 2012-12-27 DIAGNOSIS — R21 Rash and other nonspecific skin eruption: Secondary | ICD-10-CM | POA: Insufficient documentation

## 2012-12-27 DIAGNOSIS — Z79899 Other long term (current) drug therapy: Secondary | ICD-10-CM | POA: Insufficient documentation

## 2012-12-27 LAB — COMPREHENSIVE METABOLIC PANEL
AST: 48 U/L — ABNORMAL HIGH (ref 0–37)
Albumin: 3 g/dL — ABNORMAL LOW (ref 3.5–5.2)
Alkaline Phosphatase: 120 U/L — ABNORMAL HIGH (ref 39–117)
CO2: 25 mEq/L (ref 19–32)
Chloride: 103 mEq/L (ref 96–112)
GFR calc non Af Amer: >90 mL/min (ref >90)
Potassium: 3.8 mEq/L (ref 3.5–5.1)
Total Bilirubin: 0.3 mg/dL (ref 0.3–1.2)

## 2012-12-27 LAB — CBC WITH DIFFERENTIAL/PLATELET
Basophils Absolute: 0 10*3/uL (ref 0.0–0.1)
Basophils Relative: 0 % (ref 0–1)
HCT: 42.5 % (ref 39.0–52.0)
Hemoglobin: 15.3 g/dL (ref 13.0–17.0)
Lymphocytes Relative: 24 % (ref 12–46)
MCHC: 36 g/dL (ref 30.0–36.0)
Monocytes Relative: 9 % (ref 3–12)
Neutro Abs: 3 10*3/uL (ref 1.7–7.7)
Neutrophils Relative %: 59 % (ref 43–77)
WBC: 5.2 10*3/uL (ref 4.0–10.5)

## 2012-12-27 LAB — URINALYSIS, ROUTINE W REFLEX MICROSCOPIC
Bilirubin Urine: NEGATIVE
Glucose, UA: NEGATIVE mg/dL
Hgb urine dipstick: NEGATIVE
Ketones, ur: NEGATIVE mg/dL
Protein, ur: NEGATIVE mg/dL

## 2012-12-27 MED ORDER — NAPROXEN 500 MG PO TABS: 500.0000 mg | ORAL_TABLET | Freq: Two times a day (BID) | ORAL | Status: DC

## 2012-12-27 MED ORDER — SODIUM CHLORIDE 0.9 % IV BOLUS (SEPSIS)
1000.0000 mL | INTRAVENOUS | Status: AC
  Administered 2012-12-27: 1000 mL via INTRAVENOUS

## 2012-12-27 MED ORDER — ONDANSETRON HCL 4 MG/2ML IJ SOLN
4.0000 mg | Freq: Once | INTRAMUSCULAR | Status: AC
  Administered 2012-12-27: 4 mg via INTRAVENOUS
  Filled 2012-12-27: qty 2

## 2012-12-27 MED ORDER — HYDROMORPHONE HCL PF 1 MG/ML IJ SOLN
1.0000 mg | Freq: Once | INTRAMUSCULAR | Status: AC
  Administered 2012-12-27: 1 mg via INTRAVENOUS
  Filled 2012-12-27: qty 1

## 2012-12-27 MED ORDER — OXYCODONE-ACETAMINOPHEN 5-325 MG PO TABS: 1.0000 | ORAL_TABLET | ORAL | Status: DC | PRN

## 2012-12-27 MED ORDER — OXYCODONE-ACETAMINOPHEN 5-325 MG PO TABS
1.0000 | ORAL_TABLET | Freq: Once | ORAL | Status: AC
  Administered 2012-12-27: 1 via ORAL
  Filled 2012-12-27: qty 1

## 2012-12-27 NOTE — ED Provider Notes (Signed)
History     CSN: [id number]  Arrival date & time 12/27/12  4098   First MD Initiated Contact with Patient 12/27/12 1935      Chief Complaint  Patient presents with  . Emesis    (Consider location/radiation/quality/duration/timing/severity/associated sxs/prior treatment) HPI Comments: The pt is a 52 y/o male with recent septic arthritis of the R ankle who had I and D in the OR performed 2 weeks ago by Dr. Charlann Boxer.  He presents with n/v for the last 24 hours.  He has been having ongoing pain in his legs though he notes that it is improving over last 2 weeks since surgery.  He has PICC line in the RUE which is not causing any pain.  Sx of n/v are persistent, nothing makes better or worse, has not had much PO today b/c of sx.  No diarrha, no fever, no cp, sob, cough.  He has a rash which started in last coupld of days which has a Metallurgist of the other leg rash.  Starts at the knee and extends proximally, is not itchy, warm or tender.  Patient is a 52 y.o. male presenting with vomiting. The history is provided by the patient and medical records.  Emesis   Past Medical History  Diagnosis Date  . Hepatic cirrhosis   . Hypertension     NO LONGER TAKING B/P MEDS--STATES DISCONTINUED BY HIS DOCTOR  . Anxiety   . Shortness of breath   . GERD (gastroesophageal reflux disease)   . Restless leg syndrome   . Complication of anesthesia     ENDO/COLONOSCOPY ATTEMPTED IN THE PAST-UNABLE TO ADEQUATELY SEDATE PT-TOLD WOULD NEED ANESTHESIA FOR FUTURE PROCEDURES  . Pneumonia 09-2012  . Headache     HX OF MIGRAINES  . Arthritis     hands  . Hepatitis B   . Hepatitis C     Past Surgical History  Procedure Laterality Date  . Appendectomy    . Esophageal banding      FOR TX OF GI BLEED ABOUT 2008  . Arm debridement  08-11-12    right forearm wound with debridement 2 months ago/ right abdomen also-healing well  . Multiple tooth extractions  08/12/2012  . Colonoscopy with propofol  08/13/2012    Procedure: COLONOSCOPY WITH PROPOFOL;  Surgeon: Willis Modena, MD;  Location: [facility] ENDOSCOPY;  Service: Endoscopy;  Laterality: N/A;  . 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 Esophagogastroduodenoscopy N/A 11/18/2012    Procedure: ESOPHAGOGASTRODUODENOSCOPY (EGD);  Surgeon: Shirley Friar, MD;  Location: [facility]OSCOPY;  Service: Endoscopy;  Laterality: N/A;  . Esophageal banding  11/18/2012    Procedure: ESOPHAGEAL BANDING;  Surgeon: Shirley Friar, MD;  Location: [facility] ENDOSCOPY;  Service: Endoscopy;;  . I&d extremity Right 12/13/2012    Procedure: IRRIGATION AND DEBRIDEMENT EXTREMITY;  Surgeon: Shelda Pal, MD;  Location: [facility] OR;  Service: Orthopedics;  Laterality: Right;    No family history on file.  History  Substance Use Topics  . Smoking status: Current Every Day Smoker -- 0.50 packs/day for 30 years    Types: Cigarettes  . Smokeless tobacco: Never Used  . Alcohol Use: No     Comment: RARELY USES ALCOHOL      Review of Systems  Gastrointestinal: Positive for vomiting.  All other systems reviewed and are negative.    Allergies  Tylenol; Morphine and related; and Tramadol  Home Medications   Current Outpatient Rx  Name  Route  Sig  Dispense  Refill  . albuterol (PROVENTIL HFA;VENTOLIN HFA) 108 (90 BASE) MCG/ACT inhaler   Inhalation   Inhale 1-2 puffs into the lungs every 4 (four) hours as needed for wheezing. For wheezing         . amitriptyline (ELAVIL) 25 MG tablet   Oral   Take 25 mg by mouth at bedtime.         . docusate sodium (COLACE) 100 MG capsule   Oral   Take 100 mg by mouth 2 (two) times daily as needed for constipation. For constipation         . oxyCODONE (OXY IR/ROXICODONE) 5 MG immediate release tablet   Oral   Take 5-15 mg by mouth every 4 (four) hours as needed for pain. For pain         . polyethylene glycol (MIRALAX / GLYCOLAX) packet   Oral   Take 17 g by mouth daily as needed. For constipation          . rOPINIRole (REQUIP) 3 MG tablet   Oral   Take 3 mg by mouth at bedtime.         . vancomycin (VANCOCIN) 1 GM/200ML SOLN   Intravenous   Inject 200 mLs (1,000 mg total) into the vein every 12 (twelve) hours.   11200 mL   0     To be given for 4 weeks. Weekly trough levels to  ...   . naproxen (NAPROSYN) 500 MG tablet   Oral   Take 1 tablet (500 mg total) by mouth 2 (two) times daily with a meal.   30 tablet   0   . oxyCODONE-acetaminophen (PERCOCET) 5-325 MG per tablet   Oral   Take 1 tablet by mouth every 4 (four) hours as needed for pain.   10 tablet   0     BP 148/85  Pulse 97  Temp(Src) 97.8 F (36.6 C) (Oral)  Resp 20  SpO2 97%  Physical Exam  Nursing note and vitals reviewed. Constitutional: He appears well-developed and well-nourished. No distress.  HENT:  Head: Normocephalic and atraumatic.  Mouth/Throat: No oropharyngeal exudate.  Mucous membranes mildly dehydrated  Eyes: Conjunctivae and EOM are normal. Right eye exhibits no discharge. Left eye exhibits no discharge. No scleral icterus.  Blindness of the right eye, baseline for patient, left eye appears normal  Neck: Normal range of motion. Neck supple. No JVD present. No thyromegaly present.  Cardiovascular: Normal rate, regular rhythm, normal heart sounds and intact distal pulses.  Exam reveals no gallop and no friction rub.   No murmur heard. Pulmonary/Chest: Effort normal and breath sounds normal. No respiratory distress. He has no wheezes. He has no rales.  Abdominal: Soft. Bowel sounds are normal. He exhibits no distension and no mass. There is tenderness ( Mild right-sided abdominal tenderness, patient states he has had this for years, this is unchanged from prior).  Musculoskeletal: Normal range of motion. He exhibits tenderness ( Mild tenderness with range of motion of the right ankle, postoperative wound appears to be healing appropriately, minimal erythema and minimal tenderness over the  wound which is clean dry and intact). He exhibits no edema.  Lymphadenopathy:    He has no cervical adenopathy.  Neurological: He is alert. Coordination normal.  Patient follows commands, has normal strength in all 4 extremities, speech is clear  Skin: Skin is warm and dry. Rash noted. No erythema.  Faint petechial rash which extends from the knees  proximally on his bilateral medial thighs, linear, nontender, non-urticarial, no purpura  Psychiatric: He has a normal mood and affect. His behavior is normal.    ED Course  Procedures (including critical care time)  Labs Reviewed  CBC WITH DIFFERENTIAL - Abnormal; Notable for the following:    Eosinophils Relative 8 (*)    All other components within normal limits  COMPREHENSIVE METABOLIC PANEL - Abnormal; Notable for the following:    Glucose, Bld 131 (*)    Total Protein 8.8 (*)    Albumin 3.0 (*)    AST 48 (*)    Alkaline Phosphatase 120 (*)    All other components within normal limits  URINALYSIS, ROUTINE W REFLEX MICROSCOPIC   No results found.   1. Post-op pain   2. Nausea and vomiting       MDM  I performed a bedside ultrasound exam and there is not appear to be any compressible deficits in his venous system of the common femoral, superficial femoral or popliteal veins of his bilateral lower extremities. Doppler studies were not performed. I suspect that the rash on his legs from scratching, it is not appear to be pathologic, it is a mirror image of each other from the left leg to the right leg and in the pattern that would be consistent with scratching with a fingernail however the patient denies this and there is no signs of superinfection on top. Will check blood counts and electrolytes, rehydrate, check urinalysis as the patient states he does have a "stinky urine"   The patient has a clean urinalysis, normal blood counts, no fever, now states that he has run out of his pain medication which is one of the reasons that he  can. I do not believe that his rashes significant, he has been tolerating by mouth and has a followup on Tuesday.     Vida Roller, MD 12/27/12 2149

## 2012-12-27 NOTE — ED Notes (Signed)
Pt stated that 2 weeks ago he was discharged from [facility] for ankle sx on the right.  Since then he has been feeling nervous, in pain all over, and not well.  Since last night he symptoms has been worse.  His first check up with the surgeon is this Tuesday.  Pt presents with rash on in thighs of legs bilaterally.

## 2012-12-27 NOTE — ED Notes (Signed)
Did not remove PICC line from right arm today.

## 2012-12-27 NOTE — ED Notes (Signed)
PT. REPORTS EMESIS AND FEELING NERVOUS TODAY WITH RASHES AT INNER THIGHS CURRENTLY RECEIVING IV ANTIBIOTIC USING HIS PICC LINE FOR RIGHT ANKLE INFECTION .

## 2012-12-27 NOTE — ED Notes (Signed)
Discussed that pain medication may cause drowsiness and to not perform activities that requires alertness.

## 2013-01-27 ENCOUNTER — Other Ambulatory Visit (HOSPITAL_COMMUNITY): Payer: Self-pay | Admitting: Gastroenterology

## 2013-01-27 DIAGNOSIS — R1011 Right upper quadrant pain: Secondary | ICD-10-CM

## 2013-01-30 ENCOUNTER — Encounter (HOSPITAL_COMMUNITY): Payer: Self-pay | Admitting: *Deleted

## 2013-01-30 DIAGNOSIS — B1910 Unspecified viral hepatitis B without hepatic coma: Secondary | ICD-10-CM

## 2013-01-30 DIAGNOSIS — T148XXD Other injury of unspecified body region, subsequent encounter: Secondary | ICD-10-CM

## 2013-01-30 HISTORY — DX: Unspecified viral hepatitis B without hepatic coma: B19.10

## 2013-01-30 HISTORY — DX: Other injury of unspecified body region, subsequent encounter: T14.8XXD

## 2013-02-02 ENCOUNTER — Emergency Department (HOSPITAL_COMMUNITY): Payer: Medicaid Other

## 2013-02-02 ENCOUNTER — Inpatient Hospital Stay (HOSPITAL_COMMUNITY)
Admission: EM | Admit: 2013-02-02 | Discharge: 2013-02-05 | DRG: 377 | Disposition: A | Discharge status: Home / Self Care | Payer: Medicaid Other | Attending: Internal Medicine | Admitting: Internal Medicine

## 2013-02-02 ENCOUNTER — Encounter (HOSPITAL_COMMUNITY): Payer: Self-pay | Admitting: Emergency Medicine

## 2013-02-02 DIAGNOSIS — K5900 Constipation, unspecified: Secondary | ICD-10-CM | POA: Diagnosis present

## 2013-02-02 DIAGNOSIS — IMO0002 Reserved for concepts with insufficient information to code with codable children: Secondary | ICD-10-CM

## 2013-02-02 DIAGNOSIS — K219 Gastro-esophageal reflux disease without esophagitis: Secondary | ICD-10-CM | POA: Diagnosis present

## 2013-02-02 DIAGNOSIS — I10 Essential (primary) hypertension: Secondary | ICD-10-CM | POA: Diagnosis present

## 2013-02-02 DIAGNOSIS — K920 Hematemesis: Secondary | ICD-10-CM | POA: Diagnosis present

## 2013-02-02 DIAGNOSIS — B1920 Unspecified viral hepatitis C without hepatic coma: Secondary | ICD-10-CM | POA: Diagnosis present

## 2013-02-02 DIAGNOSIS — K2970 Gastritis, unspecified, without bleeding: Secondary | ICD-10-CM

## 2013-02-02 DIAGNOSIS — K922 Gastrointestinal hemorrhage, unspecified: Principal | ICD-10-CM | POA: Diagnosis present

## 2013-02-02 DIAGNOSIS — I8501 Esophageal varices with bleeding: Secondary | ICD-10-CM

## 2013-02-02 DIAGNOSIS — K7460 Unspecified cirrhosis of liver: Secondary | ICD-10-CM | POA: Diagnosis present

## 2013-02-02 DIAGNOSIS — F17200 Nicotine dependence, unspecified, uncomplicated: Secondary | ICD-10-CM | POA: Diagnosis present

## 2013-02-02 DIAGNOSIS — R109 Unspecified abdominal pain: Secondary | ICD-10-CM

## 2013-02-02 DIAGNOSIS — K921 Melena: Secondary | ICD-10-CM

## 2013-02-02 DIAGNOSIS — B1910 Unspecified viral hepatitis B without hepatic coma: Secondary | ICD-10-CM | POA: Diagnosis present

## 2013-02-02 DIAGNOSIS — I8511 Secondary esophageal varices with bleeding: Secondary | ICD-10-CM | POA: Diagnosis present

## 2013-02-02 LAB — CBC WITH DIFFERENTIAL/PLATELET
HCT: 37.5 % — ABNORMAL LOW (ref 39.0–52.0)
MCH: 28.7 pg (ref 26.0–34.0)
MCHC: 33.9 g/dL (ref 30.0–36.0)
MCV: 84.8 fL (ref 78.0–100.0)
RDW: 16.7 % — ABNORMAL HIGH (ref 11.5–15.5)

## 2013-02-02 LAB — URINALYSIS, ROUTINE W REFLEX MICROSCOPIC
Glucose, UA: NEGATIVE mg/dL
Leukocytes, UA: NEGATIVE
Protein, ur: NEGATIVE mg/dL
Specific Gravity, Urine: 1.024 (ref 1.005–1.030)

## 2013-02-02 LAB — CBC
MCHC: 33.1 g/dL (ref 30.0–36.0)
Platelets: 82 10*3/uL — ABNORMAL LOW (ref 150–400)
RDW: 16.8 % — ABNORMAL HIGH (ref 11.5–15.5)
WBC: 3.7 10*3/uL — ABNORMAL LOW (ref 4.0–10.5)

## 2013-02-02 LAB — MRSA PCR SCREENING: MRSA by PCR: NEGATIVE

## 2013-02-02 LAB — COMPREHENSIVE METABOLIC PANEL
Albumin: 3.3 g/dL — ABNORMAL LOW (ref 3.5–5.2)
BUN: 27 mg/dL — ABNORMAL HIGH (ref 6–23)
Calcium: 9.6 mg/dL (ref 8.4–10.5)
Chloride: 105 mEq/L (ref 96–112)
Creatinine, Ser: 0.77 mg/dL (ref 0.50–1.35)
Total Bilirubin: 0.5 mg/dL (ref 0.3–1.2)
Total Protein: 7.5 g/dL (ref 6.0–8.3)

## 2013-02-02 LAB — LIPASE, BLOOD: Lipase: 15 U/L (ref 11–59)

## 2013-02-02 LAB — APTT: aPTT: 54 seconds — ABNORMAL HIGH (ref 24–37)

## 2013-02-02 LAB — RAPID URINE DRUG SCREEN, HOSP PERFORMED
Barbiturates: NOT DETECTED
Cocaine: NOT DETECTED
Opiates: NOT DETECTED

## 2013-02-02 LAB — TYPE AND SCREEN: ABO/RH(D): O POS

## 2013-02-02 LAB — OCCULT BLOOD, POC DEVICE: Fecal Occult Bld: POSITIVE — AB

## 2013-02-02 MED ORDER — OCTREOTIDE ACETATE 50 MCG/ML IJ SOLN
50.0000 ug | Freq: Once | INTRAMUSCULAR | Status: AC
  Administered 2013-02-02: 50 ug via SUBCUTANEOUS
  Filled 2013-02-02: qty 1

## 2013-02-02 MED ORDER — ONDANSETRON HCL 4 MG/2ML IJ SOLN: 4.0000 mg | Freq: Four times a day (QID) | INTRAMUSCULAR | Status: DC | PRN

## 2013-02-02 MED ORDER — HYDROMORPHONE HCL PF 1 MG/ML IJ SOLN
0.5000 mg | INTRAMUSCULAR | Status: DC | PRN
  Administered 2013-02-02 – 2013-02-03 (×5): 1 mg via INTRAVENOUS
  Filled 2013-02-02 (×5): qty 1

## 2013-02-02 MED ORDER — SODIUM CHLORIDE 0.9 % IJ SOLN: 3.0000 mL | INTRAMUSCULAR | Status: DC | PRN

## 2013-02-02 MED ORDER — ONDANSETRON HCL 4 MG PO TABS: 4.0000 mg | ORAL_TABLET | Freq: Four times a day (QID) | ORAL | Status: DC | PRN

## 2013-02-02 MED ORDER — ONDANSETRON HCL 4 MG/2ML IJ SOLN
4.0000 mg | Freq: Once | INTRAMUSCULAR | Status: AC
  Administered 2013-02-02: 4 mg via INTRAVENOUS
  Filled 2013-02-02: qty 2

## 2013-02-02 MED ORDER — SODIUM CHLORIDE 0.9 % IV SOLN
80.0000 mg | Freq: Once | INTRAVENOUS | Status: AC
  Administered 2013-02-02: 80 mg via INTRAVENOUS
  Filled 2013-02-02: qty 80

## 2013-02-02 MED ORDER — SODIUM CHLORIDE 0.9 % IV SOLN
INTRAVENOUS | Status: DC
  Administered 2013-02-02 – 2013-02-05 (×3): via INTRAVENOUS

## 2013-02-02 MED ORDER — HYDROMORPHONE HCL PF 1 MG/ML IJ SOLN
1.0000 mg | Freq: Once | INTRAMUSCULAR | Status: AC
  Administered 2013-02-02: 1 mg via INTRAVENOUS
  Filled 2013-02-02: qty 1

## 2013-02-02 MED ORDER — ALBUTEROL SULFATE HFA 108 (90 BASE) MCG/ACT IN AERS
1.0000 | INHALATION_SPRAY | RESPIRATORY_TRACT | Status: DC | PRN
  Filled 2013-02-02: qty 6.7

## 2013-02-02 MED ORDER — SODIUM CHLORIDE 0.9 % IV SOLN
8.0000 mg/h | INTRAVENOUS | Status: DC
  Administered 2013-02-02 – 2013-02-03 (×2): 8 mg/h via INTRAVENOUS
  Filled 2013-02-02 (×6): qty 80

## 2013-02-02 MED ORDER — SODIUM CHLORIDE 0.9 % IJ SOLN
3.0000 mL | Freq: Two times a day (BID) | INTRAMUSCULAR | Status: DC
  Administered 2013-02-04: 3 mL via INTRAVENOUS

## 2013-02-02 MED ORDER — HYDROMORPHONE HCL PF 1 MG/ML IJ SOLN
1.0000 mg | Freq: Once | INTRAMUSCULAR | Status: AC
Start: 2013-02-02 — End: 2013-02-02
  Administered 2013-02-02: 1 mg via INTRAVENOUS
  Filled 2013-02-02: qty 1

## 2013-02-02 MED ORDER — SODIUM CHLORIDE 0.9 % IV SOLN: 250.0000 mL | INTRAVENOUS | Status: DC | PRN

## 2013-02-02 MED ORDER — SODIUM CHLORIDE 0.9 % IV SOLN
50.0000 ug/h | INTRAVENOUS | Status: DC
  Administered 2013-02-02 – 2013-02-04 (×6): 50 ug/h via INTRAVENOUS
  Filled 2013-02-02 (×12): qty 1

## 2013-02-02 MED ORDER — SODIUM CHLORIDE 0.9 % IV BOLUS (SEPSIS)
1000.0000 mL | Freq: Once | INTRAVENOUS | Status: AC
  Administered 2013-02-02: 1000 mL via INTRAVENOUS

## 2013-02-02 NOTE — H&P (Signed)
Hospital Admission Note Date: 02/02/2013  Patient name: Mark Christensen Medical record number: [id number] Date of birth: 10-31-1960 Age: 52 y.o. Gender: male PCP: Jeri Cos, MD  Medical Service: IMTS  Attending physician:  Dr. Eben Burow    1st Contact:  Dr. Burtis Junes  Pager: [phone] 2nd Contact:  Dr. Dierdre Searles   Pager: [phone] After 5 pm or weekends: 1st Contact:      Pager: [phone] 2nd Contact:      Pager: [phone]  Chief Complaint: dark vomit and stool  History of Present Illness: Mr. Koeppen is a 52 yo man with a pmh of Hep B and C, liver cirrhosis with documented esophageal varices s/p banding presented after an acute episode of hematemesis at 2 am the morning of admission.  Pt has chronic abdominal pain that he states takes percocet for relief. No new or exacerbated abdominal pain. The pt states that over the past month he has noticed about a 40 lb weight loss. He did also have a melanotic stool that he described as "black" the afternoon of admission and then prompted his presentation. Over the past month he has also felt weak and had some night sweats. All of his family members are sick with URI symptoms but pt has not had a cough, CP, or rhinorrhea. 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 He follows with Dr. Dulce Sellar with GI in terms of his cirrhosis management. Otherwise pt has not had diarrhea, change in bowel habits, change in stool character, jaundice, or protuberance of his abdomen.   Meds: Current Outpatient Rx  Name  Route  Sig  Dispense  Refill  . albuterol (PROVENTIL HFA;VENTOLIN HFA) 108 (90 BASE) MCG/ACT inhaler   Inhalation   Inhale 1-2 puffs into the lungs every 4 (four) hours as needed for wheezing. For wheezing         . amitriptyline (ELAVIL) 25 MG tablet   Oral   Take 25 mg by mouth at bedtime.         . docusate sodium (COLACE) 100 MG capsule   Oral   Take 100 mg by mouth 2 (two) times  daily as needed for constipation. For constipation         . FLUoxetine (PROZAC) 40 MG capsule   Oral   Take 40 mg by mouth daily.         . nabumetone (RELAFEN) 500 MG tablet   Oral   Take 500 mg by mouth 2 (two) times daily.         . naproxen (NAPROSYN) 500 MG tablet   Oral   Take 1 tablet (500 mg total) by mouth 2 (two) times daily with a meal.   30 tablet   0   . omeprazole (PRILOSEC) 20 MG capsule   Oral   Take 20 mg by mouth daily.         Marland Kitchen oxyCODONE (OXY IR/ROXICODONE) 5 MG immediate release tablet   Oral   Take 5-15 mg by mouth every 4 (four) hours as needed for pain. For pain         . polyethylene glycol (MIRALAX / GLYCOLAX) packet   Oral   Take 17 g by mouth daily as needed. For constipation         . promethazine (PHENERGAN) 25 MG tablet   Oral   Take 25 mg by mouth every 6 (six) hours as needed for  nausea.         Marland Kitchen EXPIRED: promethazine (PHENERGAN) 25 MG tablet   Oral   Take 1 tablet (25 mg total) by mouth every 6 (six) hours as needed for nausea.   20 tablet   0     Allergies: Allergies as of 02/02/2013 - Review Complete 02/02/2013  Allergen Reaction Noted  . Tylenol (acetaminophen) Other (See Comments) 12/06/2011  . Asa (aspirin) Other (See Comments) 02/02/2013  . Morphine and related Itching 11/09/2011  . Tramadol Nausea And Vomiting and Swelling 06/29/2012   Past Medical History  Diagnosis Date  . Hepatic cirrhosis   . Hypertension     NO LONGER TAKING B/P MEDS--STATES DISCONTINUED BY HIS DOCTOR  . Anxiety   . Shortness of breath   . GERD (gastroesophageal reflux disease)   . Restless leg syndrome   . Complication of anesthesia     ENDO/COLONOSCOPY ATTEMPTED IN THE PAST-UNABLE TO ADEQUATELY SEDATE PT-TOLD WOULD NEED ANESTHESIA FOR FUTURE PROCEDURES  . Pneumonia 09-2012  . Headache     HX OF MIGRAINES  . Arthritis     hands  . Hepatitis B 01-30-13    liver enzymes remains elevated  . Hepatitis C   . Wound healing,  delayed 01-30-13    3-22-'14 s/p I&D right ankle surgery(infection) -minimal drainage with dressing   Past Surgical History  Procedure Laterality Date  . Appendectomy    . Esophageal banding      FOR TX OF GI BLEED ABOUT 2008  . Arm debridement  08-11-12    right forearm wound with debridement 2 months ago/ right abdomen also-healing well  . Multiple tooth extractions  08/12/2012  . Colonoscopy with propofol  08/13/2012    Procedure: COLONOSCOPY WITH PROPOFOL;  Surgeon: Willis Modena, MD;  Location: [facility] ENDOSCOPY;  Service: Endoscopy;  Laterality: N/A;  . 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 Esophagogastroduodenoscopy N/A 11/18/2012    Procedure: ESOPHAGOGASTRODUODENOSCOPY (EGD);  Surgeon: Shirley Friar, MD;  Location: [facility] ENDOSCOPY;  Service: Endoscopy;  Laterality: N/A;  . Esophageal banding  11/18/2012    Procedure: ESOPHAGEAL BANDING;  Surgeon: Shirley Friar, MD;  Location: [facility] ENDOSCOPY;  Service: Endoscopy;;  . I&d extremity Right 12/13/2012    Procedure: IRRIGATION AND DEBRIDEMENT EXTREMITY;  Surgeon: Shelda Pal, MD;  Location: [facility] OR;  Service: Orthopedics;  Laterality: Right;  . Ankle surgery      Debridement of ankle 12-13-12-[place]/ Dr. Ashley Royalty  . Peripherally inserted central catheter insertion      insertion and removal(IVR)   No family history on file. History   Social History  . Marital Status: Married    Spouse Name: N/A    Number of Children: N/A  . Years of Education: N/A   Occupational History  . Not on file.   Social History Main Topics  . Smoking status: Current Every Day Smoker -- 0.50 packs/day for 30 years    Types: Cigarettes  . Smokeless tobacco: Never Used  . Alcohol Use: No     Comment: RARELY USES ALCOHOL  . Drug Use: No     Comment: HX OF POSITIVE URINE DRUG SCREEN FOR COCAINE--PT DENIES USE OF COCAINE  . Sexually Active: Yes   Other Topics Concern  . Not on file   Social History Narrative  . No  narrative on file    Review of Systems: Pertinent items are noted in HPI.  Physical Exam: Blood pressure  140/99, pulse 78, temperature 98.3 F (36.8 C), temperature source Oral, resp. rate 18, SpO2 95.00%. General: resting in bed HEENT: PERRL, EOMI, no scleral icterus, MMM Cardiac: RRR, no rubs, murmurs or gallops Pulm: clear to auscultation bilaterally, moving normal volumes of air Abd: soft, nontender, slightly protuberant, nondistended, BS present Ext: warm and well perfused, no pedal edema, well healing granulation tissue on right medial malleous, several scattered tattos Neuro: alert and oriented X3, cranial nerves II-XII grossly intact   Lab results: Basic Metabolic Panel:  Recent Labs  16/10/96 1509  NA 136  K 4.3  CL 105  CO2 23  GLUCOSE 92  BUN 27*  CREATININE 0.77  CALCIUM 9.6   Liver Function Tests:  Recent Labs  02/02/13 1509  AST 23  ALT 35  ALKPHOS 108  BILITOT 0.5  PROT 7.5  ALBUMIN 3.3*    Recent Labs  02/02/13 1523  LIPASE 15   CBC:  Recent Labs  02/02/13 1509  WBC 4.6  HGB 12.7*  HCT 37.5*  MCV 84.8  PLT 102*   Coagulation:  Recent Labs  02/02/13 1510  LABPROT 13.5  INR 1.04   Urine Drug Screen: Drugs of Abuse     Component Value Date/Time   LABOPIA NONE DETECTED 06/09/2012 1332   LABOPIA  Value: POSITIVE (NOTE) Result repeated and verified. Sent for confirmatory testing* 06/05/2010 0629   COCAINSCRNUR POSITIVE* 06/09/2012 1332   COCAINSCRNUR NEGATIVE 06/05/2010 0629   LABBENZ NONE DETECTED 06/09/2012 1332   LABBENZ  Value: POSITIVE (NOTE) Result repeated and verified. Sent for confirmatory testing* 06/05/2010 0629   AMPHETMU NONE DETECTED 06/09/2012 1332   AMPHETMU NEGATIVE 06/05/2010 0629   THCU NONE DETECTED 06/09/2012 1332   LABBARB NONE DETECTED 06/09/2012 1332    Urinalysis:  Recent Labs  02/02/13 1545  COLORURINE YELLOW  LABSPEC 1.024  PHURINE 6.5  GLUCOSEU NEGATIVE  HGBUR NEGATIVE  BILIRUBINUR NEGATIVE   KETONESUR NEGATIVE  PROTEINUR NEGATIVE  UROBILINOGEN 2.0*  NITRITE NEGATIVE  LEUKOCYTESUR NEGATIVE   Imaging results:  Dg Chest Port 1 View  02/02/2013  *RADIOLOGY REPORT*  Clinical Data: Bleeding, hematemesis, esophageal varices, shortness of breath  PORTABLE CHEST - 1 VIEW  Comparison: 11/19/2012  Findings: Cardiomediastinal silhouette is stable.  No acute infiltrate or pleural effusion.  No pulmonary edema.  Bony thorax is unremarkable.  IMPRESSION: No active disease.   Original Report Authenticated By: Natasha Mead, M.D.    Assessment & Plan by Problem: 1. UGIB: pt has a hx of UGIB and known banded varices. Baseline Hgb 13-15 and today 12 along with FOBT +. Pt VSS and GI consulted.   -protonix gtt -octreotide gtt -cbc q6 -type and cross blood -IV NS 100cc/hr -GI consult  2. 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 Dulce Sellar. MELD:7.  On presentation LFTs normal and INR 1.04.  -UDS pending -cont to monitor  Dispo: Disposition is deferred at this time, awaiting improvement of current medical problems. Anticipated discharge in approximately 1-2 day(s).   The patient does have a current PCP [name], [name], MD), therefore will be requiring [facility] follow-up after discharge.   The patient does not have transportation limitations that hinder transportation to clinic appointments.  Signed: Christen Bame, MD PGY-1 Pgr: [phone] 02/02/2013, 5:12 PM

## 2013-02-02 NOTE — ED Provider Notes (Addendum)
History     CSN: [id number]  Arrival date & time 02/02/13  1433   First MD Initiated Contact with Patient 02/02/13 1503      Chief Complaint  Patient presents with  . Emesis  . Rectal Bleeding    (Consider location/radiation/quality/duration/timing/severity/associated sxs/prior treatment) HPI Comments: Pt comes in with cc of rectal bleed and hematemesis. Pt has hx of liver cirrhosis and documented esophageal varices, some gastric findings from portal hypertension. Pt states that he woke up around 2 am, had bloody emesis, with 2-3 clots. He went to bed, woke up and had melanotic, tarry stools. He has had 1 more episode of hematemesis and melena since. Pt also complains of diffuse upper quadrant pain of the abd. He has this pain everyday, however, it is more severe than usual. There is no syncope, however, pt is slightly tired, and feels a little dizzy.  Patient is a 52 y.o. male presenting with vomiting and hematochezia. The history is provided by the patient.  Emesis Associated symptoms: abdominal pain   Associated symptoms: no arthralgias, no chills and no headaches   Rectal Bleeding  Associated symptoms include abdominal pain, nausea and vomiting. Pertinent negatives include no fever, no chest pain, no headaches, no coughing and no rash.    Past Medical History  Diagnosis Date  . Hepatic cirrhosis   . Hypertension     NO LONGER TAKING B/P MEDS--STATES DISCONTINUED BY HIS DOCTOR  . Anxiety   . Shortness of breath   . GERD (gastroesophageal reflux disease)   . Restless leg syndrome   . Complication of anesthesia     ENDO/COLONOSCOPY ATTEMPTED IN THE PAST-UNABLE TO ADEQUATELY SEDATE PT-TOLD WOULD NEED ANESTHESIA FOR FUTURE PROCEDURES  . Pneumonia 09-2012  . Headache     HX OF MIGRAINES  . Arthritis     hands  . Hepatitis B 01-30-13    liver enzymes remains elevated  . Hepatitis C   . Wound healing, delayed 01-30-13    3-22-'14 s/p I&D right ankle surgery(infection) -minimal  drainage with dressing    Past Surgical History  Procedure Laterality Date  . Appendectomy    . Esophageal banding      FOR TX OF GI BLEED ABOUT 2008  . Arm debridement  08-11-12    right forearm wound with debridement 2 months ago/ right abdomen also-healing well  . Multiple tooth extractions  08/12/2012  . Colonoscopy with propofol  08/13/2012    Procedure: COLONOSCOPY WITH PROPOFOL;  Surgeon: Willis Modena, MD;  Location: [facility] ENDOSCOPY;  Service: Endoscopy;  Laterality: N/A;  . 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 Esophagogastroduodenoscopy N/A 11/18/2012    Procedure: ESOPHAGOGASTRODUODENOSCOPY (EGD);  Surgeon: Shirley Friar, MD;  Location: [facility] ENDOSCOPY;  Service: Endoscopy;  Laterality: N/A;  . Esophageal banding  11/18/2012    Procedure: ESOPHAGEAL BANDING;  Surgeon: Shirley Friar, MD;  Location: [facility] ENDOSCOPY;  Service: Endoscopy;;  . I&d extremity Right 12/13/2012    Procedure: IRRIGATION AND DEBRIDEMENT EXTREMITY;  Surgeon: Shelda Pal, MD;  Location: [facility] OR;  Service: Orthopedics;  Laterality: Right;  . Ankle surgery      Debridement of ankle 12-13-12-[place]/ Dr. Ashley Royalty  . Peripherally inserted central catheter insertion      insertion and removal(IVR)    No family history on file.  History  Substance Use Topics  . Smoking status: Current Every Day Smoker -- 0.50 packs/day for 30 years  Types: Cigarettes  . Smokeless tobacco: Never Used  . Alcohol Use: No     Comment: RARELY USES ALCOHOL      Review of Systems  Constitutional: Positive for fatigue. Negative for fever, chills and activity change.  HENT: Negative for neck pain.   Eyes: Negative for visual disturbance.  Respiratory: Negative for cough, chest tightness and shortness of breath.   Cardiovascular: Negative for chest pain.  Gastrointestinal: Positive for nausea, vomiting, abdominal pain, blood in stool and hematochezia. Negative for abdominal distention.   Genitourinary: Negative for dysuria, enuresis and difficulty urinating.  Musculoskeletal: Negative for arthralgias.  Skin: Negative for rash.  Neurological: Positive for dizziness. Negative for light-headedness and headaches.  Hematological: Bruises/bleeds easily.  Psychiatric/Behavioral: Negative for confusion.    Allergies  Tylenol; Asa; Morphine and related; and Tramadol  Home Medications   Current Outpatient Rx  Name  Route  Sig  Dispense  Refill  . albuterol (PROVENTIL HFA;VENTOLIN HFA) 108 (90 BASE) MCG/ACT inhaler   Inhalation   Inhale 1-2 puffs into the lungs every 4 (four) hours as needed for wheezing. For wheezing         . amitriptyline (ELAVIL) 25 MG tablet   Oral   Take 25 mg by mouth at bedtime.         . docusate sodium (COLACE) 100 MG capsule   Oral   Take 100 mg by mouth 2 (two) times daily as needed for constipation. For constipation         . FLUoxetine (PROZAC) 40 MG capsule   Oral   Take 40 mg by mouth daily.         . nabumetone (RELAFEN) 500 MG tablet   Oral   Take 500 mg by mouth 2 (two) times daily.         . naproxen (NAPROSYN) 500 MG tablet   Oral   Take 1 tablet (500 mg total) by mouth 2 (two) times daily with a meal.   30 tablet   0   . omeprazole (PRILOSEC) 20 MG capsule   Oral   Take 20 mg by mouth daily.         Marland Kitchen oxyCODONE (OXY IR/ROXICODONE) 5 MG immediate release tablet   Oral   Take 5-15 mg by mouth every 4 (four) hours as needed for pain. For pain         . polyethylene glycol (MIRALAX / GLYCOLAX) packet   Oral   Take 17 g by mouth daily as needed. For constipation         . promethazine (PHENERGAN) 25 MG tablet   Oral   Take 25 mg by mouth every 6 (six) hours as needed for nausea.         Marland Kitchen EXPIRED: promethazine (PHENERGAN) 25 MG tablet   Oral   Take 1 tablet (25 mg total) by mouth every 6 (six) hours as needed for nausea.   20 tablet   0     BP 140/99  Pulse 78  Temp(Src) 98.3 F (36.8 C)  (Oral)  Resp 18  SpO2 95%  Physical Exam  Nursing note and vitals reviewed. Constitutional: He is oriented to person, place, and time. He appears well-developed.  HENT:  Head: Normocephalic and atraumatic.  Eyes: Conjunctivae and EOM are normal. Pupils are equal, round, and reactive to light. No scleral icterus.  Neck: Normal range of motion. Neck supple. No JVD present.  Cardiovascular: Normal rate and regular rhythm.   Pulmonary/Chest: Effort normal and breath  sounds normal.  Abdominal: Soft. Bowel sounds are normal. He exhibits no distension. There is no tenderness. There is no rebound and no guarding.  Melanotic stool.  Neurological: He is alert and oriented to person, place, and time.  Skin: Skin is warm.    ED Course  Procedures (including critical care time)  Labs Reviewed  CBC WITH DIFFERENTIAL  COMPREHENSIVE METABOLIC PANEL  URINALYSIS, ROUTINE W REFLEX MICROSCOPIC  APTT  PROTIME-INR  LIPASE, BLOOD  TYPE AND SCREEN   Dg Chest Port 1 View  02/02/2013  *RADIOLOGY REPORT*  Clinical Data: Bleeding, hematemesis, esophageal varices, shortness of breath  PORTABLE CHEST - 1 VIEW  Comparison: 11/19/2012  Findings: Cardiomediastinal silhouette is stable.  No acute infiltrate or pleural effusion.  No pulmonary edema.  Bony thorax is unremarkable.  IMPRESSION: No active disease.   Original Report Authenticated By: Natasha Mead, M.D.      No diagnosis found.    MDM  DDx includes: Esophagitis Mallory Weiss tear [place]  Variceal bleeding PUD/Gastritis/ulcers Diverticular bleed Colon cancer Rectal bleed Internal hemorrhoids External hemorrhoids   Pt with known liver cirrhosis and esophageal varices comes in with cc of hematemesis and melanotic stools. Pt is fatigued, dizzy and has epigastric and RUQ abd pain. Pt has melanotic stools per exam.  Pt has an UGIB until proven otherwise. We will establish 2 large bore iv access, and more if possible. Will start  resuscitation with fluids, and transfuse if needed. Will require admission. Octreotide and Protonix gtt to be started.     Derwood Kaplan, MD 02/02/13 1601  4:33 PM Hb is 12.7 - but more than 2 grams of drop from baseline. Family med to admit. [facility] alerted.  CRITICAL CARE Performed by: Derwood Kaplan   Total critical care time: 35 minutes Critical care time was exclusive of separately billable procedures and treating other patients.  Critical care was necessary to treat or prevent imminent or life-threatening deterioration.  Critical care was time spent personally by me on the following activities: development of treatment plan with patient and/or surrogate as well as nursing, discussions with consultants, evaluation of patient's response to treatment, examination of patient, obtaining history from patient or surrogate, ordering and performing treatments and interventions, ordering and review of laboratory studies, ordering and review of radiographic studies, pulse oximetry and re-evaluation of patient's condition.   Derwood Kaplan, MD 02/02/13 [phone]

## 2013-02-02 NOTE — ED Notes (Addendum)
Patient states nausea and emesis x2 today dark blood with clots and hematuria.  History of esophageal varices. Airway intact bilateral equal chest rise and fall.  EMS gave zofran 4mg  IV with some relief stated by patient.

## 2013-02-02 NOTE — Progress Notes (Signed)
Patient was admitted to the floor via stretcher. Pt is alert, oriented and ambulatory. No skin issues. IV located to bilateral wrist. Pt refused to watch safety video. Patient oriented to unit and bed is placed at lowest position. Will continue to monitor.   Shalynn Jorstad J. Lendell Caprice RN BSN

## 2013-02-03 ENCOUNTER — Encounter (HOSPITAL_COMMUNITY)
Admission: EM | Disposition: A | Discharge status: Home / Self Care | Payer: Self-pay | Source: Home / Self Care | Attending: Internal Medicine

## 2013-02-03 HISTORY — PX: ESOPHAGOGASTRODUODENOSCOPY: SHX5428

## 2013-02-03 LAB — BASIC METABOLIC PANEL
BUN: 19 mg/dL (ref 6–23)
Calcium: 8.8 mg/dL (ref 8.4–10.5)
Chloride: 107 mEq/L (ref 96–112)
Creatinine, Ser: 0.85 mg/dL (ref 0.50–1.35)
GFR calc Af Amer: >90 mL/min (ref >90)
GFR calc non Af Amer: >90 mL/min (ref >90)

## 2013-02-03 LAB — CBC
HCT: 36.9 % — ABNORMAL LOW (ref 39.0–52.0)
Hemoglobin: 11.9 g/dL — ABNORMAL LOW (ref 13.0–17.0)
Hemoglobin: 12.3 g/dL — ABNORMAL LOW (ref 13.0–17.0)
MCH: 29.2 pg (ref 26.0–34.0)
MCHC: 33.3 g/dL (ref 30.0–36.0)
Platelets: 68 10*3/uL — ABNORMAL LOW (ref 150–400)
RBC: 4.08 MIL/uL — ABNORMAL LOW (ref 4.22–5.81)
RBC: 4.25 MIL/uL (ref 4.22–5.81)
WBC: 3.1 10*3/uL — ABNORMAL LOW (ref 4.0–10.5)
WBC: 3.4 10*3/uL — ABNORMAL LOW (ref 4.0–10.5)

## 2013-02-03 SURGERY — EGD (ESOPHAGOGASTRODUODENOSCOPY)
Anesthesia: Moderate Sedation

## 2013-02-03 MED ORDER — BUTAMBEN-TETRACAINE-BENZOCAINE 2-2-14 % EX AERO
INHALATION_SPRAY | CUTANEOUS | Status: DC | PRN
  Administered 2013-02-03: 2 via TOPICAL

## 2013-02-03 MED ORDER — MIDAZOLAM HCL 5 MG/ML IJ SOLN
INTRAMUSCULAR | Status: AC
  Filled 2013-02-03: qty 2

## 2013-02-03 MED ORDER — HYDROMORPHONE HCL PF 1 MG/ML IJ SOLN
0.5000 mg | INTRAMUSCULAR | Status: DC | PRN
  Administered 2013-02-03: 0.5 mg via INTRAVENOUS
  Administered 2013-02-03 – 2013-02-04 (×5): 1 mg via INTRAVENOUS
  Filled 2013-02-03 (×6): qty 1

## 2013-02-03 MED ORDER — FENTANYL CITRATE 0.05 MG/ML IJ SOLN
INTRAMUSCULAR | Status: AC
  Filled 2013-02-03: qty 2

## 2013-02-03 MED ORDER — SODIUM CHLORIDE 0.9 % IV SOLN
INTRAVENOUS | Status: DC
  Administered 2013-02-03: 500 mL via INTRAVENOUS

## 2013-02-03 MED ORDER — MIDAZOLAM HCL 10 MG/2ML IJ SOLN
INTRAMUSCULAR | Status: DC | PRN
  Administered 2013-02-03: 1 mg via INTRAVENOUS
  Administered 2013-02-03: 2 mg via INTRAVENOUS
  Administered 2013-02-03: 1 mg via INTRAVENOUS
  Administered 2013-02-03: 2 mg via INTRAVENOUS

## 2013-02-03 MED ORDER — CIPROFLOXACIN IN D5W 400 MG/200ML IV SOLN
400.0000 mg | Freq: Two times a day (BID) | INTRAVENOUS | Status: DC
  Administered 2013-02-03 – 2013-02-04 (×2): 400 mg via INTRAVENOUS
  Filled 2013-02-03 (×3): qty 200

## 2013-02-03 MED ORDER — FENTANYL CITRATE 0.05 MG/ML IJ SOLN
INTRAMUSCULAR | Status: DC | PRN
  Administered 2013-02-03 (×4): 25 ug via INTRAVENOUS

## 2013-02-03 MED ORDER — PANTOPRAZOLE SODIUM 40 MG IV SOLR
40.0000 mg | Freq: Two times a day (BID) | INTRAVENOUS | Status: DC
  Administered 2013-02-03: 40 mg via INTRAVENOUS
  Filled 2013-02-03 (×3): qty 40

## 2013-02-03 NOTE — Interval H&P Note (Signed)
History and Physical Interval Note:  02/03/2013 2:33 PM  Mark Christensen  has presented today for surgery, with the diagnosis of gi-bleed  The various methods of treatment have been discussed with the patient and family. After consideration of risks, benefits and other options for treatment, the patient has consented to  Procedure(s): ESOPHAGOGASTRODUODENOSCOPY (EGD) (N/A) as a surgical intervention .  The patient's history has been reviewed, patient examined, no change in status, stable for surgery.  I have reviewed the patient's chart and labs.  Questions were answered to the patient's satisfaction.     Desta Bujak C.

## 2013-02-03 NOTE — Progress Notes (Signed)
Subjective: Pt is doing well. No acute events overnight. VSS. No repeat melanotic stool or hematemesis.   Objective: Vital signs in last 24 hours: Filed Vitals:   02/02/13 1900 02/02/13 2045 02/03/13 0500 02/03/13 0559  BP: 119/75 125/86  134/87  Pulse: 83 72  68  Temp: 97.2 F (36.2 C) 98 F (36.7 C)  98 F (36.7 C)  TempSrc: Oral Oral  Oral  Resp: 16 16  18   Height:  6\' 1"  (1.854 m)    Weight:  195 lb 12.3 oz (88.8 kg) 195 lb 5.2 oz (88.6 kg)   SpO2: 95% 95%  97%   Weight change:   Intake/Output Summary (Last 24 hours) at 02/03/13 0959 Last data filed at 02/03/13 0900  Gross per 24 hour  Intake      0 ml  Output   2000 ml  Net  -2000 ml   General: resting in bed, NAD HEENT: PERRL, EOMI, no scleral icterus Cardiac: RRR, no rubs, murmurs or gallops Pulm: clear to auscultation bilaterally, moving normal volumes of air Abd: soft, nontender, nondistended, BS present Ext: warm and well perfused, no pedal edema Neuro: alert and oriented X3, cranial nerves II-XII grossly intact  Lab Results: Basic Metabolic Panel:  Recent Labs Lab 02/02/13 1509 02/02/13 1523 02/03/13 0246  NA 136  --  136  K 4.3  --  4.1  CL 105  --  107  CO2 23  --  24  GLUCOSE 92  --  108*  BUN 27*  --  19  CREATININE 0.77  --  0.85  CALCIUM 9.6  --  8.8  MG  --  2.2  --    Liver Function Tests:  Recent Labs Lab 02/02/13 1509  AST 23  ALT 35  ALKPHOS 108  BILITOT 0.5  PROT 7.5  ALBUMIN 3.3*    Recent Labs Lab 02/02/13 1523  LIPASE 15   No results found for this basename: AMMONIA,  in the last 168 hours CBC:  Recent Labs Lab 02/02/13 2049 02/03/13 0246  WBC 3.7* 3.1*  HGB 11.2* 11.9*  HCT 33.8* 35.3*  MCV 86.2 86.5  PLT 82* 68*   Coagulation:  Recent Labs Lab 02/02/13 1510  LABPROT 13.5  INR 1.04   Urine Drug Screen: Drugs of Abuse     Component Value Date/Time   LABOPIA NONE DETECTED 02/02/2013 1545   LABOPIA  Value: POSITIVE (NOTE) Result repeated and  verified. Sent for confirmatory testing* 06/05/2010 0629   COCAINSCRNUR NONE DETECTED 02/02/2013 1545   COCAINSCRNUR NEGATIVE 06/05/2010 0629   LABBENZ NONE DETECTED 02/02/2013 1545   LABBENZ  Value: POSITIVE (NOTE) Result repeated and verified. Sent for confirmatory testing* 06/05/2010 0629   AMPHETMU NONE DETECTED 02/02/2013 1545   AMPHETMU NEGATIVE 06/05/2010 0629   THCU NONE DETECTED 02/02/2013 1545   LABBARB NONE DETECTED 02/02/2013 1545    Urinalysis:  Recent Labs Lab 02/02/13 1545  COLORURINE YELLOW  LABSPEC 1.024  PHURINE 6.5  GLUCOSEU NEGATIVE  HGBUR NEGATIVE  BILIRUBINUR NEGATIVE  KETONESUR NEGATIVE  PROTEINUR NEGATIVE  UROBILINOGEN 2.0*  NITRITE NEGATIVE  LEUKOCYTESUR NEGATIVE   Micro Results: Recent Results (from the past 240 hour(s))  MRSA PCR SCREENING     Status: None   Collection Time    02/02/13  8:44 PM      Result Value Range Status   MRSA by PCR NEGATIVE  NEGATIVE Final   Comment:            The GeneXpert MRSA  Assay (FDA     approved for NASAL specimens     only), is one component of a     comprehensive MRSA colonization     surveillance program. It is not     intended to diagnose MRSA     infection nor to guide or     monitor treatment for     MRSA infections.   Studies/Results: Dg Chest Port 1 View  02/02/2013  *RADIOLOGY REPORT*  Clinical Data: Bleeding, hematemesis, esophageal varices, shortness of breath  PORTABLE CHEST - 1 VIEW  Comparison: 11/19/2012  Findings: Cardiomediastinal silhouette is stable.  No acute infiltrate or pleural effusion.  No pulmonary edema.  Bony thorax is unremarkable.  IMPRESSION: No active disease.   Original Report Authenticated By: Natasha Mead, M.D.    Medications: I have reviewed the patient's current medications. Scheduled Meds: . sodium chloride  3 mL Intravenous Q12H   Continuous Infusions: . sodium chloride 100 mL/hr at 02/02/13 2034  . octreotide (SANDOSTATIN) infusion 50 mcg/hr (02/03/13 0300)  . pantoprozole  (PROTONIX) infusion 8 mg/hr (02/03/13 0300)   PRN Meds:.sodium chloride, albuterol, HYDROmorphone (DILAUDID) injection, ondansetron (ZOFRAN) IV, ondansetron, sodium chloride Assessment/Plan: 1. UGIB: pt has a hx of UGIB and known banded varices. Baseline Hgb 13-15 and today 12 along with FOBT +. Pt VSS and [facility] contacted this AM for Dr. Bosie Clos to see and evaluate.   -protonix gtt  -octreotide gtt  -cbc q6  -type and cross blood  -IV NS 100cc/hr  -GI consult pending.   2. 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 Dulce Sellar. MELD:7. On presentation LFTs normal and INR 1.04. UDS this admission negative.  -cont to monitor   Dispo: Disposition is deferred at this time, awaiting improvement of current medical problems.  Anticipated discharge in approximately 1-2 day(s).   The patient does have a current PCP [name], [name], MD), therefore will be requiring OPC follow-up after discharge.   The patient does not have transportation limitations that hinder transportation to clinic appointments.  .Services Needed at time of discharge: Y = Yes, Blank = No PT:   OT:   RN:   Equipment:   Other:     LOS: 1 day   Christen Bame 02/03/2013, 9:59 AM

## 2013-02-03 NOTE — Consult Note (Addendum)
Referring Provider: Dr. Eben Burow Primary Care Physician:  Jeri Cos, MD Primary Gastroenterologist:  Dr. Dulce Sellar  Reason for Consultation:  Hematemesis  HPI: Mark Christensen is a 52 y.o. male with Hep B and C decompensated cirrhosis who had a variceal bleed requiring banding in 2/14. He had the acute onset of vomiting dark black fluid X 2 followed by an episode of black tarry stools. Hemodynamically stable at admite and Hgb 12.7. Reports RUQ pain that has occurred chronically. Denies any alcohol in years. Denies NSAIDs.   Past Medical History  Diagnosis Date  . Hepatic cirrhosis   . Hypertension     NO LONGER TAKING B/P MEDS--STATES DISCONTINUED BY HIS DOCTOR  . Anxiety   . Shortness of breath   . GERD (gastroesophageal reflux disease)   . Restless leg syndrome   . Complication of anesthesia     ENDO/COLONOSCOPY ATTEMPTED IN THE PAST-UNABLE TO ADEQUATELY SEDATE PT-TOLD WOULD NEED ANESTHESIA FOR FUTURE PROCEDURES  . Pneumonia 09-2012  . Headache     HX OF MIGRAINES  . Arthritis     hands  . Hepatitis B 01-30-13    liver enzymes remains elevated  . Hepatitis C   . Wound healing, delayed 01-30-13    3-22-'14 s/p I&D right ankle surgery(infection) -minimal drainage with dressing    Past Surgical History  Procedure Laterality Date  . Appendectomy    . Esophageal banding      FOR TX OF GI BLEED ABOUT 2008  . Arm debridement  08-11-12    right forearm wound with debridement 2 months ago/ right abdomen also-healing well  . Multiple tooth extractions  08/12/2012  . Colonoscopy with propofol  08/13/2012    Procedure: COLONOSCOPY WITH PROPOFOL;  Surgeon: Willis Modena, MD;  Location: [facility] ENDOSCOPY;  Service: Endoscopy;  Laterality: N/A;  . 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 Esophagogastroduodenoscopy N/A 11/18/2012    Procedure: ESOPHAGOGASTRODUODENOSCOPY (EGD);  Surgeon: Shirley Friar, MD;  Location[facility] ENDOSCOPY;  Service: Endoscopy;  Laterality:  N/A;  . Esophageal banding  11/18/2012    Procedure: ESOPHAGEAL BANDING;  Surgeon: Shirley Friar, MD;  Location: [facility] ENDOSCOPY;  Service: Endoscopy;;  . I&d extremity Right 12/13/2012    Procedure: IRRIGATION AND DEBRIDEMENT EXTREMITY;  Surgeon: Shelda Pal, MD;  Location: [facility] OR;  Service: Orthopedics;  Laterality: Right;  . Ankle surgery      Debridement of ankle 12-13-12-[place]/ Dr. Ashley Royalty  . Peripherally inserted central catheter insertion      insertion and removal(IVR)    Prior to Admission medications   Medication Sig Start Date End Date Taking? Authorizing Provider  albuterol (PROVENTIL HFA;VENTOLIN HFA) 108 (90 BASE) MCG/ACT inhaler Inhale 1-2 puffs into the lungs every 4 (four) hours as needed for wheezing. For wheezing 11/06/12  Yes Toy Cookey, MD  amitriptyline (ELAVIL) 25 MG tablet Take 25 mg by mouth at bedtime.   Yes Historical Provider, MD  docusate sodium (COLACE) 100 MG capsule Take 100 mg by mouth 2 (two) times daily as needed for constipation. For constipation   Yes Historical Provider, MD  FLUoxetine (PROZAC) 40 MG capsule Take 40 mg by mouth daily.   Yes Historical Provider, MD  nabumetone (RELAFEN) 500 MG tablet Take 500 mg by mouth 2 (two) times daily.   Yes Historical Provider, MD  naproxen (NAPROSYN) 500 MG tablet Take 1 tablet (500 mg total) by mouth 2 (two) times daily with a meal. 12/27/12  Yes Vida Roller, MD  omeprazole (PRILOSEC) 20 MG capsule Take 20 mg by mouth daily.   Yes Historical Provider, MD  oxyCODONE (OXY IR/ROXICODONE) 5 MG immediate release tablet Take 5-15 mg by mouth every 4 (four) hours as needed for pain. For pain 12/16/12  Yes Genelle Gather Babish, PA-C  polyethylene glycol (MIRALAX / GLYCOLAX) packet Take 17 g by mouth daily as needed. For constipation 12/16/12  Yes Genelle Gather Babish, PA-C  promethazine (PHENERGAN) 25 MG tablet Take 25 mg by mouth every 6 (six) hours as needed for nausea.   Yes Historical Provider, MD   promethazine (PHENERGAN) 25 MG tablet Take 1 tablet (25 mg total) by mouth every 6 (six) hours as needed for nausea. 12/07/11 12/14/11  Carlisle Beers Molpus, MD    Scheduled Meds: . sodium chloride  3 mL Intravenous Q12H   Continuous Infusions: . sodium chloride 100 mL/hr at 02/02/13 2034  . sodium chloride 500 mL (02/03/13 1334)  . octreotide (SANDOSTATIN) infusion 50 mcg/hr (02/03/13 0300)  . pantoprozole (PROTONIX) infusion 8 mg/hr (02/03/13 0300)   PRN Meds:.sodium chloride, albuterol, HYDROmorphone (DILAUDID) injection, ondansetron (ZOFRAN) IV, ondansetron, sodium chloride  Allergies as of 02/02/2013 - Review Complete 02/02/2013  Allergen Reaction Noted  . Tylenol (acetaminophen) Other (See Comments) 12/06/2011  . Asa (aspirin) Other (See Comments) 02/02/2013  . Morphine and related Itching 11/09/2011  . Tramadol Nausea And Vomiting and Swelling 06/29/2012    No family history on file.  History   Social History  . Marital Status: Married    Spouse Name: N/A    Number of Children: N/A  . Years of Education: N/A   Occupational History  . Not on file.   Social History Main Topics  . Smoking status: Current Every Day Smoker -- 0.50 packs/day for 30 years    Types: Cigarettes  . Smokeless tobacco: Never Used  . Alcohol Use: No     Comment: RARELY USES ALCOHOL  . Drug Use: No     Comment: HX OF POSITIVE URINE DRUG SCREEN FOR COCAINE--PT DENIES USE OF COCAINE  . Sexually Active: Yes   Other Topics Concern  . Not on file   Social History Narrative  . No narrative on file    Review of Systems: All negative except as stated above in HPI.  Physical Exam: Vital signs: Filed Vitals:   02/03/13 1326  BP: 141/88  Pulse: 67  Temp: 98 F (36.7 C)  Resp: 16   Last BM Date: 02/02/13 General:   Alert,  Well-developed, well-nourished, pleasant and cooperative in NAD HEENT: anicteric Neck: supple, nontender Lungs:  Clear throughout to auscultation.   No wheezes, crackles,  or rhonchi. No acute distress. Heart:  Regular rate and rhythm; no murmurs, clicks, rubs,  or gallops. Abdomen: Right-sided tenderness with guarding, soft, nondistended, +BS  Rectal:  Deferred Ext: no edema, healing sore on right ankle Neuro: oriented, alert  GI:  Lab Results:  Recent Labs  02/02/13 1509 02/02/13 2049 02/03/13 0246  WBC 4.6 3.7* 3.1*  HGB 12.7* 11.2* 11.9*  HCT 37.5* 33.8* 35.3*  PLT 102* 82* 68*   BMET  Recent Labs  02/02/13 1509 02/03/13 0246  NA 136 136  K 4.3 4.1  CL 105 107  CO2 23 24  GLUCOSE 92 108*  BUN 27* 19  CREATININE 0.77 0.85  CALCIUM 9.6 8.8   LFT  Recent Labs  02/02/13 1509  PROT 7.5  ALBUMIN 3.3*  AST 23  ALT 35  ALKPHOS  108  BILITOT 0.5   PT/INR  Recent Labs  02/02/13 1510  LABPROT 13.5  INR 1.04     Studies/Results: Dg Chest Port 1 View  02/02/2013  *RADIOLOGY REPORT*  Clinical Data: Bleeding, hematemesis, esophageal varices, shortness of breath  PORTABLE CHEST - 1 VIEW  Comparison: 11/19/2012  Findings: Cardiomediastinal silhouette is stable.  No acute infiltrate or pleural effusion.  No pulmonary edema.  Bony thorax is unremarkable.  IMPRESSION: No active disease.   Original Report Authenticated By: Natasha Mead, M.D.     Impression/Plan: 52 yo with decompensated cirrhosis as stated above with variceal bleed earlier this year presenting with an upper GI bleed that is either variceal in origin or a peptic ulcer. Despite the lack of bright red blood, he has been banded before and likely has scar tissue present, and I think this is variceal in origin until proven otherwise. No bleeding overnight so it is not ongoing. NPO for EGD. Agree with Octreotide and Protonix infusion.    LOS: 1 day   Emersynn Deatley C.  02/03/2013, 2:33 PM

## 2013-02-03 NOTE — Progress Notes (Signed)
Utilization review completed. Chancellor Vanderloop, RN, BSN. 

## 2013-02-03 NOTE — H&P (View-Only) (Signed)
Subjective: Pt is doing well. No acute events overnight. VSS. No repeat melanotic stool or hematemesis.   Objective: Vital signs in last 24 hours: Filed Vitals:   02/02/13 1900 02/02/13 2045 02/03/13 0500 02/03/13 0559  BP: 119/75 125/86  134/87  Pulse: 83 72  68  Temp: 97.2 F (36.2 C) 98 F (36.7 C)  98 F (36.7 C)  TempSrc: Oral Oral  Oral  Resp: 16 16  18  Height:  6' 1" (1.854 m)    Weight:  195 lb 12.3 oz (88.8 kg) 195 lb 5.2 oz (88.6 kg)   SpO2: 95% 95%  97%   Weight change:   Intake/Output Summary (Last 24 hours) at 02/03/13 0959 Last data filed at 02/03/13 0900  Gross per 24 hour  Intake      0 ml  Output   2000 ml  Net  -2000 ml   General: resting in bed, NAD HEENT: PERRL, EOMI, no scleral icterus Cardiac: RRR, no rubs, murmurs or gallops Pulm: clear to auscultation bilaterally, moving normal volumes of air Abd: soft, nontender, nondistended, BS present Ext: warm and well perfused, no pedal edema Neuro: alert and oriented X3, cranial nerves II-XII grossly intact  Lab Results: Basic Metabolic Panel:  Recent Labs Lab 02/02/13 1509 02/02/13 1523 02/03/13 0246  NA 136  --  136  K 4.3  --  4.1  CL 105  --  107  CO2 23  --  24  GLUCOSE 92  --  108*  BUN 27*  --  19  CREATININE 0.77  --  0.85  CALCIUM 9.6  --  8.8  MG  --  2.2  --    Liver Function Tests:  Recent Labs Lab 02/02/13 1509  AST 23  ALT 35  ALKPHOS 108  BILITOT 0.5  PROT 7.5  ALBUMIN 3.3*    Recent Labs Lab 02/02/13 1523  LIPASE 15   No results found for this basename: AMMONIA,  in the last 168 hours CBC:  Recent Labs Lab 02/02/13 2049 02/03/13 0246  WBC 3.7* 3.1*  HGB 11.2* 11.9*  HCT 33.8* 35.3*  MCV 86.2 86.5  PLT 82* 68*   Coagulation:  Recent Labs Lab 02/02/13 1510  LABPROT 13.5  INR 1.04   Urine Drug Screen: Drugs of Abuse     Component Value Date/Time   LABOPIA NONE DETECTED 02/02/2013 1545   LABOPIA  Value: POSITIVE (NOTE) Result repeated and  verified. Sent for confirmatory testing* 06/05/2010 0629   COCAINSCRNUR NONE DETECTED 02/02/2013 1545   COCAINSCRNUR NEGATIVE 06/05/2010 0629   LABBENZ NONE DETECTED 02/02/2013 1545   LABBENZ  Value: POSITIVE (NOTE) Result repeated and verified. Sent for confirmatory testing* 06/05/2010 0629   AMPHETMU NONE DETECTED 02/02/2013 1545   AMPHETMU NEGATIVE 06/05/2010 0629   THCU NONE DETECTED 02/02/2013 1545   LABBARB NONE DETECTED 02/02/2013 1545    Urinalysis:  Recent Labs Lab 02/02/13 1545  COLORURINE YELLOW  LABSPEC 1.024  PHURINE 6.5  GLUCOSEU NEGATIVE  HGBUR NEGATIVE  BILIRUBINUR NEGATIVE  KETONESUR NEGATIVE  PROTEINUR NEGATIVE  UROBILINOGEN 2.0*  NITRITE NEGATIVE  LEUKOCYTESUR NEGATIVE   Micro Results: Recent Results (from the past 240 hour(s))  MRSA PCR SCREENING     Status: None   Collection Time    02/02/13  8:44 PM      Result Value Range Status   MRSA by PCR NEGATIVE  NEGATIVE Final   Comment:            The GeneXpert MRSA   Assay (FDA     approved for NASAL specimens     only), is one component of a     comprehensive MRSA colonization     surveillance program. It is not     intended to diagnose MRSA     infection nor to guide or     monitor treatment for     MRSA infections.   Studies/Results: Dg Chest Port 1 View  02/02/2013  *RADIOLOGY REPORT*  Clinical Data: Bleeding, hematemesis, esophageal varices, shortness of breath  PORTABLE CHEST - 1 VIEW  Comparison: 11/19/2012  Findings: Cardiomediastinal silhouette is stable.  No acute infiltrate or pleural effusion.  No pulmonary edema.  Bony thorax is unremarkable.  IMPRESSION: No active disease.   Original Report Authenticated By: Liviu Pop, M.D.    Medications: I have reviewed the patient's current medications. Scheduled Meds: . sodium chloride  3 mL Intravenous Q12H   Continuous Infusions: . sodium chloride 100 mL/hr at 02/02/13 2034  . octreotide (SANDOSTATIN) infusion 50 mcg/hr (02/03/13 0300)  . pantoprozole  (PROTONIX) infusion 8 mg/hr (02/03/13 0300)   PRN Meds:.sodium chloride, albuterol, HYDROmorphone (DILAUDID) injection, ondansetron (ZOFRAN) IV, ondansetron, sodium chloride Assessment/Plan: 1. UGIB: pt has a hx of UGIB and known banded varices. Baseline Hgb 13-15 and today 12 along with FOBT +. Pt VSS and [facility] contacted this AM for Dr. Schooler to see and evaluate.   -protonix gtt  -octreotide gtt  -cbc q6  -type and cross blood  -IV NS 100cc/hr  -GI consult pending.   2. 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 Outlaw. MELD:7. On presentation LFTs normal and INR 1.04. UDS this admission negative.  -cont to monitor   Dispo: Disposition is deferred at this time, awaiting improvement of current medical problems.  Anticipated discharge in approximately 1-2 day(s).   The patient does have a current PCP (FRAZIER, [name], MD), therefore will be requiring OPC follow-up after discharge.   The patient does not have transportation limitations that hinder transportation to clinic appointments.  .Services Needed at time of discharge: Y = Yes, Blank = No PT:   OT:   RN:   Equipment:   Other:     LOS: 1 day   Mark Christensen 02/03/2013, 9:59 AM  

## 2013-02-03 NOTE — Care Management Note (Signed)
    Page 1 of 1   02/05/2013     11:40:58 AM   CARE MANAGEMENT NOTE 02/05/2013  Patient:  Mark Christensen, Mark Christensen   Account Number:  [id number]  Date Initiated:  02/03/2013  Documentation initiated by:  Letha Cape  Subjective/Objective Assessment:   dx upper gib  admit- lives with spouse.     Action/Plan:   Anticipated DC Date:  02/05/2013   Anticipated DC Plan:  HOME/SELF CARE      DC Planning Services  CM consult      Choice offered to / List presented to:             Status of service:  Completed, signed off Medicare Important Message given?   (If response is "NO", the following Medicare IM given date fields will be blank) Date Medicare IM given:   Date Additional Medicare IM given:    Discharge Disposition:  HOME/SELF CARE  Per UR Regulation:  Reviewed for med. necessity/level of care/duration of stay  If discussed at Long Length of Stay Meetings, dates discussed:    Comments:  02/05/13 11:38 Letha Cape RN, BSN [phone] patient is for dc today, No needs anticipated.  02/03/13 16:24 Letha Cape RN, BSN [phone] patient lives with spouse, NCM will continue to follow dc needs.

## 2013-02-03 NOTE — Op Note (Signed)
[facility] [address] [place], [address]   ENDOSCOPY PROCEDURE REPORT  PATIENT: Halston, Kintz  MR#: [id number] BIRTHDATE: 09/27/1960 , 51  yrs. old GENDER: Male  ENDOSCOPIST: Charlott Rakes, MD REFERRED NF:[id number] team  PROCEDURE DATE:  02/03/2013 PROCEDURE:   EGD w/ band ligation of varices ASA CLASS:   Class III INDICATIONS:Hematemesis. MEDICATIONS: Fentanyl 75 mcg IV, Versed 6 mg IV, and Cetacaine spray x 2  TOPICAL ANESTHETIC:  DESCRIPTION OF PROCEDURE:   After the risks benefits and alternatives of the procedure were thoroughly explained, informed consent was obtained.  The Pentax Gastroscope [id number]  endoscope was introduced through the mouth and advanced to the second portion of the duodenum , limited by Without limitations.   The instrument was slowly withdrawn as the mucosa was fully examined.     FINDINGS: The endoscope was inserted into the oropharynx and esophagus was intubated.  The gastroesophageal junction was noted to be 42 cm from the incisors.  Grade I - II esophageal varices were seen in the distal esophagus with scarring seen. At 38 cm from the incisors was a focal raised area with a faint red spot seen centrally consistent with a nipple sign. No active bleeding was seen from this site or in the esophagus. Endoscope was advanced into the stomach, which revealed clear fluid and mild portal hypertensive gastropathy (PHG) in the proximal stomach.  The endoscope was advanced to the duodenal bulb and second portion of duodenum which were unremarkable.  The endoscope was withdrawn back into the stomach and retroflexion was done again showing mild PHG. The endoscope was withdrawn and a band ligator was attached to the tip of the endoscope. The endoscope was reinserted and one band was placed in the usual fashion at the site of this nipple sign and during suctioning of the mucosa bleeding occurred that stopped after  placement of the band.  COMPLICATIONS: None  ENDOSCOPIC IMPRESSION:     1. Esophageal variceal bleed (recent not active at insertion of scope) with nipple sign -s/p band X 1 2. Mild Portal Hypertensive Gastropathy  RECOMMENDATIONS: Continue Octreotide drip; NPO except sips of water; Follow H/Hs; Antibiotics   REPEAT EXAM: N/A  _______________________________ Charlott Rakes, MD eSigned:  Charlott Rakes, MD 02/03/2013 3:10 PM    CC:  PATIENT NAME:  Orell, Hurtado MR#: [id number]

## 2013-02-03 NOTE — H&P (Signed)
Internal Medicine Attending Admission Note Date: 02/03/2013  Patient name: Mark Christensen Medical record number: [id number] Date of birth: 01-31-1961 Age: 52 y.o. Gender: male  I saw and evaluated the patient. I reviewed the resident's note and I agree with the resident's findings and plan as documented in the resident's note.  Chief Complaint(s): Upper GI bleed  History - key components related to admission: Patient is a 52 year old man with past medical history most significant for liver cirrhosis secondary to hepatitis B and C. complicated by esophageal varices status post banding presented with an acute episode of vomiting dark colored on the morning of admission. Patient had 2 episodes and he could not quantify the amount of blood. He came to the ER at this point. Patient had one episode of having melanotic stool yesterday. Patient's initial hemoglobin the ER was found to be 12.7 and it was decided to admit the patient to internal medicine service. [facility] follows the patient as outpatient and were alerted by the ER physician.  Patient also complained of right upper quadrant pain which he says is chronic and is secondary to his gall bladder as told to him by Dr Dulce Sellar.   15 point review of systems negative except as noted above.  Past medical history, past surgical history, medications, family history and social history was reviewed and as per resident's note.   Physical Exam - key components related to admission:  Filed Vitals:   02/02/13 1900 02/02/13 2045 02/03/13 0500 02/03/13 0559  BP: 119/75 125/86  134/87  Pulse: 83 72  68  Temp: 97.2 F (36.2 C) 98 F (36.7 C)  98 F (36.7 C)  TempSrc: Oral Oral  Oral  Resp: 16 16  18   Height:  6\' 1"  (1.854 m)    Weight:  195 lb 12.3 oz (88.8 kg) 195 lb 5.2 oz (88.6 kg)   SpO2: 95% 95%  97%  Physical Exam: General: Vital signs reviewed and noted. Well-developed, well-nourished, in no acute distress; alert, appropriate and  cooperative throughout examination.  Head: Normocephalic, atraumatic.  Eyes: PERRL, EOMI, No signs of anemia or jaundince.  Nose: Mucous membranes moist, not inflammed, nonerythematous.  Throat: Oropharynx nonerythematous, no exudate appreciated.   Neck: No deformities, masses, or tenderness noted.Supple, No carotid Bruits, no JVD.  Lungs:  Normal respiratory effort. Clear to auscultation BL without crackles or wheezes.  Heart: RRR. S1 and S2 normal without gallop, murmur, or rubs.  Abdomen:  BS normoactive. Soft, Nondistended, tender to touch over right upper quadrant.  No masses or organomegaly.  Extremities: No pretibial edema.  Neurologic: A&O X3, CN II - XII are grossly intact. Motor strength is 5/5 in the all 4 extremities, Sensations intact to light touch, Cerebellar signs negative.  Skin: No visible rashes, scars.   Lab results:   Basic Metabolic Panel:  Recent Labs  78/29/56 1509 02/02/13 1523 02/03/13 0246  NA 136  --  136  K 4.3  --  4.1  CL 105  --  107  CO2 23  --  24  GLUCOSE 92  --  108*  BUN 27*  --  19  CREATININE 0.77  --  0.85  CALCIUM 9.6  --  8.8  MG  --  2.2  --    Liver Function Tests:  Recent Labs  02/02/13 1509  AST 23  ALT 35  ALKPHOS 108  BILITOT 0.5  PROT 7.5  ALBUMIN 3.3*    Recent Labs  02/02/13 1523  LIPASE 15  CBC:  Recent Labs  02/02/13 2049 02/03/13 0246  WBC 3.7* 3.1*  HGB 11.2* 11.9*  HCT 33.8* 35.3*  MCV 86.2 86.5  PLT 82* 68*   Coagulation:  Recent Labs  02/02/13 1510  INR 1.04   Imaging results:  Dg Chest Port 1 View  02/02/2013  *RADIOLOGY REPORT*  Clinical Data: Bleeding, hematemesis, esophageal varices, shortness of breath  PORTABLE CHEST - 1 VIEW  Comparison: 11/19/2012  Findings: Cardiomediastinal silhouette is stable.  No acute infiltrate or pleural effusion.  No pulmonary edema.  Bony thorax is unremarkable.  IMPRESSION: No active disease.   Original Report Authenticated By: Natasha Mead, M.D.      Assessment & Plan by Problem:  Principal Problem:   UGIB (upper gastrointestinal bleed) Active Problems:   Hepatitis C   Hepatitis B   Cirrhosis of liver   Hematemesis/vomiting blood   Hypertension  Patient is a 52 year old man with past medical history as noted above who was admitted after episode of upper GI bleed. Patient's hemoglobin is 12 today which is not too far from his baseline of 13-15. Patient is currently on protonix drip, octreotide drip and IV fluids. We are awaiting GI consult at this time. Patient will most likely need an upper GI endoscopy to evaluate the status of varices.   I will also suggest right upper quadrant ultrasound to evaluate abdominal pain.  We will continue patient's home medications.  Rest of the medical management as per resident's note.  Lars Mage MD Faculty-Internal Medicine Residency Program

## 2013-02-03 NOTE — Brief Op Note (Signed)
Evidence of recent variceal bleed. See endopro note. S/P variceal band ligation X 1. See recs.

## 2013-02-04 ENCOUNTER — Inpatient Hospital Stay (HOSPITAL_COMMUNITY): Payer: Medicaid Other

## 2013-02-04 ENCOUNTER — Encounter (HOSPITAL_COMMUNITY): Payer: Self-pay | Admitting: Gastroenterology

## 2013-02-04 DIAGNOSIS — K920 Hematemesis: Secondary | ICD-10-CM

## 2013-02-04 DIAGNOSIS — I10 Essential (primary) hypertension: Secondary | ICD-10-CM

## 2013-02-04 DIAGNOSIS — I8501 Esophageal varices with bleeding: Secondary | ICD-10-CM

## 2013-02-04 DIAGNOSIS — B1920 Unspecified viral hepatitis C without hepatic coma: Secondary | ICD-10-CM

## 2013-02-04 DIAGNOSIS — K7460 Unspecified cirrhosis of liver: Secondary | ICD-10-CM

## 2013-02-04 DIAGNOSIS — B1910 Unspecified viral hepatitis B without hepatic coma: Secondary | ICD-10-CM

## 2013-02-04 DIAGNOSIS — K921 Melena: Secondary | ICD-10-CM

## 2013-02-04 DIAGNOSIS — K922 Gastrointestinal hemorrhage, unspecified: Principal | ICD-10-CM

## 2013-02-04 LAB — CBC
Hemoglobin: 11.9 g/dL — ABNORMAL LOW (ref 13.0–17.0)
MCV: 85.1 fL (ref 78.0–100.0)
Platelets: 59 10*3/uL — ABNORMAL LOW (ref 150–400)
RBC: 4.17 MIL/uL — ABNORMAL LOW (ref 4.22–5.81)
WBC: 3.2 10*3/uL — ABNORMAL LOW (ref 4.0–10.5)

## 2013-02-04 MED ORDER — PANTOPRAZOLE SODIUM 40 MG PO TBEC
40.0000 mg | DELAYED_RELEASE_TABLET | Freq: Every day | ORAL | Status: DC
  Administered 2013-02-04 – 2013-02-05 (×2): 40 mg via ORAL
  Filled 2013-02-04 (×2): qty 1

## 2013-02-04 MED ORDER — CIPROFLOXACIN HCL 500 MG PO TABS
500.0000 mg | ORAL_TABLET | Freq: Two times a day (BID) | ORAL | Status: DC
  Administered 2013-02-04 – 2013-02-05 (×3): 500 mg via ORAL
  Filled 2013-02-04 (×5): qty 1

## 2013-02-04 MED ORDER — HYDROMORPHONE HCL PF 1 MG/ML IJ SOLN
0.5000 mg | INTRAMUSCULAR | Status: DC | PRN
  Administered 2013-02-04 – 2013-02-05 (×6): 1 mg via INTRAVENOUS
  Filled 2013-02-04 (×6): qty 1

## 2013-02-04 NOTE — Progress Notes (Signed)
Internal Medicine Teaching Service Attending Note Date: 02/04/2013  Patient name: Mark Christensen  Medical record number: [id number]  Date of birth: 02/19/1961    This patient has been seen and discussed with the house staff. Please see their note for complete details. I concur with their findings with the following additions/corrections: Patient will likely be discharged home tomorrow. Appreciate GI for their help.   Lars Mage 02/04/2013, 9:33 PM

## 2013-02-04 NOTE — Progress Notes (Signed)
Subjective: No further hematemesis. Is hungry.  Objective: Vital signs in last 24 hours: Temp:  [97.9 F (36.6 C)-98 F (36.7 C)] 97.9 F (36.6 C) (05/14 0613) Pulse Rate:  [67-72] 72 (05/14 0613) Resp:  [13-18] 18 (05/14 0613) BP: (135-158)/(82-107) 150/82 mmHg (05/14 0613) SpO2:  [92 %-97 %] 97 % (05/14 0613) Weight:  [89.2 kg (196 lb 10.4 oz)] 89.2 kg (196 lb 10.4 oz) (05/14 0613) Weight change: 0.4 kg (14.1 oz) Last BM Date: 02/02/13  PE: GEN:  NAD ABD:  Soft  Lab Results: CBC    Component Value Date/Time   WBC 3.4* 02/03/2013 2046   RBC 4.25 02/03/2013 2046   HGB 12.3* 02/03/2013 2046   HCT 36.9* 02/03/2013 2046   PLT 69* 02/03/2013 2046   MCV 86.8 02/03/2013 2046   MCH 28.9 02/03/2013 2046   MCHC 33.3 02/03/2013 2046   RDW 16.5* 02/03/2013 2046   LYMPHSABS 1.3 12/27/2012 1932   MONOABS 0.4 12/27/2012 1932   EOSABS 0.4 12/27/2012 1932   BASOSABS 0.0 12/27/2012 1932   CMP     Component Value Date/Time   NA 136 02/03/2013 0246   K 4.1 02/03/2013 0246   CL 107 02/03/2013 0246   CO2 24 02/03/2013 0246   GLUCOSE 108* 02/03/2013 0246   BUN 19 02/03/2013 0246   CREATININE 0.85 02/03/2013 0246   CALCIUM 8.8 02/03/2013 0246   PROT 7.5 02/02/2013 1509   ALBUMIN 3.3* 02/02/2013 1509   AST 23 02/02/2013 1509   ALT 35 02/02/2013 1509   ALKPHOS 108 02/02/2013 1509   BILITOT 0.5 02/02/2013 1509   GFRNONAA >90 02/03/2013 0246   GFRAA >90 02/03/2013 0246   Assessment:  1.  Cirrhosis (alcohol-mediated, now abstinent for years) with hematemesis from esophageal variceal bleeding.  Endoscopy done yesterday with nipple-sign on distal esophageal varix, with band placed.  No recurrent bleeding.  Plan:  1.  Advance diet. 2.  Octreotide for another 24 hours. 3.  Transition pantoprazole and ciprofloxacin to oral administration. 4.  Would continue pantoprazole 40 mg po once-a-day indefinitely upon discharge. 5.  Would continue ciprofloxacin 500 mg bid for 10-day total course (GI bleed in setting of  cirrhosis). 6.  If does well today with diet advancement and no further bleeding, would likely be able to discharge home tomorrow. 7.  Will follow; thank you for the consult.   Freddy Jaksch 02/04/2013, 8:10 AM

## 2013-02-04 NOTE — Progress Notes (Signed)
Subjective: Pt is doing well. No acute events overnight. VSS. No repeat melanotic stool or hematemesis. Tolerated EGD procedure well yesterday.   Objective: Vital signs in last 24 hours: Filed Vitals:   02/03/13 1530 02/03/13 1545 02/03/13 2052 02/04/13 0613  BP: 135/87 144/87 142/99 150/82  Pulse:   68 72  Temp:   98 F (36.7 C) 97.9 F (36.6 C)  TempSrc:   Oral Oral  Resp: 14 15 16 18   Height:      Weight:    196 lb 10.4 oz (89.2 kg)  SpO2: 97% 92% 95% 97%   Weight change: 14.1 oz (0.4 kg)  Intake/Output Summary (Last 24 hours) at 02/04/13 0844 Last data filed at 02/04/13 2841  Gross per 24 hour  Intake 2446.67 ml  Output   2225 ml  Net 221.67 ml   General: resting in bed, NAD HEENT: PERRL, EOMI, no scleral icterus Cardiac: RRR, no rubs, murmurs or gallops Pulm: clear to auscultation bilaterally, moving normal volumes of air Abd: soft, nontender, nondistended, BS present Ext: warm and well perfused, no pedal edema Neuro: alert and oriented X3, cranial nerves II-XII grossly intact  Lab Results: Basic Metabolic Panel:  Recent Labs Lab 02/02/13 1509 02/02/13 1523 02/03/13 0246  NA 136  --  136  K 4.3  --  4.1  CL 105  --  107  CO2 23  --  24  GLUCOSE 92  --  108*  BUN 27*  --  19  CREATININE 0.77  --  0.85  CALCIUM 9.6  --  8.8  MG  --  2.2  --    Liver Function Tests:  Recent Labs Lab 02/02/13 1509  AST 23  ALT 35  ALKPHOS 108  BILITOT 0.5  PROT 7.5  ALBUMIN 3.3*    Recent Labs Lab 02/02/13 1523  LIPASE 15   No results found for this basename: AMMONIA,  in the last 168 hours CBC:  Recent Labs Lab 02/03/13 0246 02/03/13 2046  WBC 3.1* 3.4*  HGB 11.9* 12.3*  HCT 35.3* 36.9*  MCV 86.5 86.8  PLT 68* 69*   Coagulation:  Recent Labs Lab 02/02/13 1510  LABPROT 13.5  INR 1.04   Urine Drug Screen: Drugs of Abuse     Component Value Date/Time   LABOPIA NONE DETECTED 02/02/2013 1545   LABOPIA  Value: POSITIVE (NOTE) Result repeated  and verified. Sent for confirmatory testing* 06/05/2010 0629   COCAINSCRNUR NONE DETECTED 02/02/2013 1545   COCAINSCRNUR NEGATIVE 06/05/2010 0629   LABBENZ NONE DETECTED 02/02/2013 1545   LABBENZ  Value: POSITIVE (NOTE) Result repeated and verified. Sent for confirmatory testing* 06/05/2010 0629   AMPHETMU NONE DETECTED 02/02/2013 1545   AMPHETMU NEGATIVE 06/05/2010 0629   THCU NONE DETECTED 02/02/2013 1545   LABBARB NONE DETECTED 02/02/2013 1545    Urinalysis:  Recent Labs Lab 02/02/13 1545  COLORURINE YELLOW  LABSPEC 1.024  PHURINE 6.5  GLUCOSEU NEGATIVE  HGBUR NEGATIVE  BILIRUBINUR NEGATIVE  KETONESUR NEGATIVE  PROTEINUR NEGATIVE  UROBILINOGEN 2.0*  NITRITE NEGATIVE  LEUKOCYTESUR NEGATIVE   Micro Results: Recent Results (from the past 240 hour(s))  MRSA PCR SCREENING     Status: None   Collection Time    02/02/13  8:44 PM      Result Value Range Status   MRSA by PCR NEGATIVE  NEGATIVE Final   Comment:            The GeneXpert MRSA Assay (FDA     approved for NASAL  specimens     only), is one component of a     comprehensive MRSA colonization     surveillance program. It is not     intended to diagnose MRSA     infection nor to guide or     monitor treatment for     MRSA infections.   Studies/Results: Dg Chest Port 1 View  02/02/2013   *RADIOLOGY REPORT*  Clinical Data: Bleeding, hematemesis, esophageal varices, shortness of breath  PORTABLE CHEST - 1 VIEW  Comparison: 11/19/2012  Findings: Cardiomediastinal silhouette is stable.  No acute infiltrate or pleural effusion.  No pulmonary edema.  Bony thorax is unremarkable.  IMPRESSION: No active disease.   Original Report Authenticated By: Natasha Mead, M.D.   Medications: I have reviewed the patient's current medications. Scheduled Meds: . ciprofloxacin  500 mg Oral BID  . pantoprazole  40 mg Oral Daily  . sodium chloride  3 mL Intravenous Q12H   Continuous Infusions: . sodium chloride 100 mL/hr at 02/03/13 2014  .  octreotide (SANDOSTATIN) infusion 50 mcg/hr (02/04/13 0225)   PRN Meds:.sodium chloride, albuterol, HYDROmorphone (DILAUDID) injection, ondansetron (ZOFRAN) IV, ondansetron, sodium chloride Assessment/Plan: 1. UGIB: pt has a hx of UGIB and known banded varices. Baseline Hgb 13-15 and today 12 along with FOBT +. EGD 02/03/13 shows varices s/p banding.  -d/c protonix gtt transitioned po (40mg  daily) -octreotide gtt for another 24 hrs -cipro 500mg  BID (for complete 10 day course given esophageal in cirrhosis)  -cbc q12 -liquid diet and ADAT -GI following and greatly appreciate recs  2. 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 Dulce Sellar. MELD:7. On presentation LFTs normal and INR 1.04. UDS this admission negative.  -cont to monitor -Abd [place] pending   Dispo: Disposition is deferred at this time, awaiting improvement of current medical problems.  Anticipated discharge in approximately 1-2 day(s).   The patient does have a current PCP [name], [name], MD), therefore will be requiring OPC follow-up after discharge.   The patient does not have transportation limitations that hinder transportation to clinic appointments.  .Services Needed at time of discharge: Y = Yes, Blank = No PT:   OT:   RN:   Equipment:   Other:     LOS: 2 days   Christen Bame 02/04/2013, 8:44 AM Pgr: [phone]

## 2013-02-05 ENCOUNTER — Encounter (HOSPITAL_COMMUNITY): Payer: Self-pay | Admitting: Pharmacy Technician

## 2013-02-05 DIAGNOSIS — R109 Unspecified abdominal pain: Secondary | ICD-10-CM

## 2013-02-05 LAB — GLUCOSE, CAPILLARY
Glucose-Capillary: 97 mg/dL (ref 70–99)
Glucose-Capillary: 106 mg/dL — ABNORMAL HIGH (ref 70–99)

## 2013-02-05 MED ORDER — OXYCODONE HCL 5 MG PO TABS: 5.0000 mg | ORAL_TABLET | ORAL | Status: DC | PRN

## 2013-02-05 MED ORDER — CIPROFLOXACIN HCL 500 MG PO TABS: 500.0000 mg | ORAL_TABLET | Freq: Two times a day (BID) | ORAL | Status: DC

## 2013-02-05 MED ORDER — PANTOPRAZOLE SODIUM 40 MG PO TBEC: 40.0000 mg | DELAYED_RELEASE_TABLET | Freq: Every day | ORAL | Status: DC

## 2013-02-05 NOTE — Progress Notes (Signed)
RN came in to relieve the off going nurse Dora. Dora during report decided that she would hang the patient's Octreotide (Sandostatin). In hanging the patient's medication, the RN Verlee Monte) set the drug up as a basic infusion at a rate of 250 ml/hr instead of using the guardrails IV drug list on the pump. The charge nurse Marcelyn Bruins went in to check on the patient's pump because it was beeping around 0100 and discovered that the patient's medication was set up at a rate of 250 ml/hr instead of the ordered rate of 71ml/hr. Tiyika then notifed the RN who was caring for the patient of what  had happened. The RN then notifed the pharmacy to ask them what they would recommend to be done in the given situation. Pharmacy stated to call the patient's doctor. The doctor who was on call was notified at 0105 who stated that he would call back once he did some research and to write up the nurse that was responsible for the mishap. The MD then called back around 0120 and wrote an order for  Q4 vital signs as well as CBG. The patient was put on telemetry.  The MD stated he would also  contact  GI on call  for any recommendations and that he would call back. RN is awaiting a call back from the MD. RN went in to explain to the patient what had happened at 0125. The patient's CBG was taken and was 97. Pt was also given 1mg  of dilaudid IV for some abdominal pain rating a 7 on a scale of 0-10.The patient' s vital signs were taken reading a temperature of 98.3 orally, Blood pressure of 146/96, a heart rate of 70, Respirations of 18, and O2 sat of 99% on RA. Pt said okay but that his heart was fine. Pt was on the phone with his wife.  The order for   Octreotide (Sandostatin) was discontinued at 0158. RN is awaiting further orders. RN will continue to monitor the patient.

## 2013-02-05 NOTE — Discharge Summary (Signed)
Internal Medicine Teaching [facility] Discharge Note  Name: Mark Christensen MRN: [id number] DOB: 05-06-1961 52 y.o.  Date of Admission: 02/02/2013  2:33 PM Date of Discharge: 02/05/2013 Attending Physician: Lars Mage, MD  Discharge Diagnosis: Principal Problem:   UGIB (upper gastrointestinal bleed) Active Problems:   Hepatitis C   Hepatitis B   Cirrhosis of liver   Hematemesis/vomiting blood   Hypertension   Discharge Medications:   Medication List    STOP taking these medications       naproxen 500 MG tablet  Commonly known as:  NAPROSYN     omeprazole 20 MG capsule  Commonly known as:  PRILOSEC      TAKE these medications       albuterol 108 (90 BASE) MCG/ACT inhaler  Commonly known as:  PROVENTIL HFA;VENTOLIN HFA  Inhale 1-2 puffs into the lungs every 4 (four) hours as needed for wheezing. For wheezing     amitriptyline 25 MG tablet  Commonly known as:  ELAVIL  Take 25 mg by mouth at bedtime.     ciprofloxacin 500 MG tablet  Commonly known as:  CIPRO  Take 1 tablet (500 mg total) by mouth 2 (two) times daily.     docusate sodium 100 MG capsule  Commonly known as:  COLACE  Take 100 mg by mouth 2 (two) times daily as needed for constipation. For constipation     FLUoxetine 40 MG capsule  Commonly known as:  PROZAC  Take 40 mg by mouth daily.     nabumetone 500 MG tablet  Commonly known as:  RELAFEN  Take 500 mg by mouth 2 (two) times daily.     oxyCODONE 5 MG immediate release tablet  Commonly known as:  Oxy IR/ROXICODONE  Take 1 tablet (5 mg total) by mouth every 4 (four) hours as needed for pain. For pain     pantoprazole 40 MG tablet  Commonly known as:  PROTONIX  Take 1 tablet (40 mg total) by mouth daily.     polyethylene glycol packet  Commonly known as:  MIRALAX / GLYCOLAX  Take 17 g by mouth daily as needed. For constipation     promethazine 25 MG tablet  Commonly known as:  PHENERGAN  Take 1 tablet (25 mg total) by mouth every 6  (six) hours as needed for nausea.     promethazine 25 MG tablet  Commonly known as:  PHENERGAN  Take 25 mg by mouth every 6 (six) hours as needed for nausea.        Disposition and follow-up:   Mark Christensen was discharged from [facility] in Stable condition.  At the hospital follow up visit please address anemia and liver cirrhosis concern for UGIB.   Follow-up Appointments: Follow-up Information   Follow up with Dr. Jeannetta Nap. (Feb 12, 2013 at 10:30)    Contact information:   [facility]      Future Appointments Provider Department Dept Phone   02/10/2013 9:00 AM [facility] NUCLEAR MEDICINE [phone]   NPO 6 hrs prior to scheduled exam No Opiate-based MEDS 6 hrs prior Peg Tube solutions: D/C for 6 hrs NPO after midnight      Consultations: Treatment Team:  Shirley Friar, MD Gastroenterology   Procedures Performed:  US Abdomen Complete  02/04/2013   *RADIOLOGY REPORT*  Clinical Data:  Right upper quadrant pain.  Evaluate gallbladder.  ABDOMINAL ULTRASOUND COMPLETE  Comparison:  11/11/2012  Findings:  Gallbladder:  Sludge is noted within the other.  Focal area of gallbladder thickening measures up to 3.4 mm.  No stones.  Negative sonographic Murphy's sign.  Common Bile Duct:  Within normal limits in caliber.  Liver: The liver has a nodular contour suggestive of cirrhosis.  No focal mass lesion identified.  IVC:  Appears normal.  Pancreas:  No abnormality identified.  Spleen:  The spleen is enlarged measuring 14.5 cm.  The volume is equal to 879.7 ml.  Right kidney:  The right kidney measures 13 cm.  Normal size and echogenicity.  There is a cyst within the upper pole measuring 1.4 cm.  Left kidney:  The left kidney appears normal measuring 12.2 cm. There is a cyst within the upper pole measuring 1.5 cm.  Abdominal Aorta:  No aneurysm identified.  IMPRESSION:  1.  Morphologic features of the liver compatible with  cirrhosis. 2.  Gallbladder sludge.  The gallbladder wall is prominent measuring up to 3.4 mm.  Negative sonographic Murphy's sign.  No pericholecystic fluid. 3.  Bilateral renal cyst peri 4.  Splenomegaly.   Original Report Authenticated By: Signa Kell, M.D.   Dg Chest Port 1 View  02/02/2013   *RADIOLOGY REPORT*  Clinical Data: Bleeding, hematemesis, esophageal varices, shortness of breath  PORTABLE CHEST - 1 VIEW  Comparison: 11/19/2012  Findings: Cardiomediastinal silhouette is stable.  No acute infiltrate or pleural effusion.  No pulmonary edema.  Bony thorax is unremarkable.  IMPRESSION: No active disease.   Original Report Authenticated By: Natasha Mead, M.D.   Admission HPI: [name] is a 52 yo man with a pmh of Hep B and C, liver cirrhosis with documented esophageal varices s/p banding presented after an acute episode of hematemesis at 2 am the morning of admission. Pt has chronic abdominal pain that he states takes percocet for relief. No new or exacerbated abdominal pain. The pt states that over the past month he has noticed about a 40 lb weight loss. He did also have a melanotic stool that he described as "black" the afternoon of admission and then prompted his presentation. Over the past month he has also felt weak and had some night sweats. All of his family members are sick with URI symptoms but pt has not had a cough, CP, or rhinorrhea. 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 He follows with Dr. Dulce Sellar with GI in terms of his cirrhosis management. Otherwise pt has not had diarrhea, change in bowel habits, change in stool character, jaundice, or protuberance of his abdomen   Hospital Course by problem list: 1. UGIB: Patients has a hx of UGIB and known banded esophageal varices. Baseline Hgb 13-15 and on admission was 12 along with FOBT + and report of hematemesis and melanotic stool during the morning of  admission. Pt was seen and evaluated by GI and was started on octreotide and protonix gtt and had EGD 02/03/13 that showed varices s/p banding. The pt was started on oral protonix 40mg  qd indefinitely and ciprofloxacin to complete a 10 course per GI recommendations. Pt didn't have any repeat hematemesis during admission and only one occasion of dark stools. Would recommend f/u CBC as outpt.   2. Liver cirrhosis 2/2 Hep B and C: Pt follows with Dr. Dulce Sellar of [facility] for management. . On presentation LFTs normal, INR 1.04, and  MELD:7. UDS this admission was negative. Abdominal US was done to  evaluate chronic RUQ pain and showed some gallbladder sludge but no acute cholecystitis and -Murphy's sign. This will be followed up with outpt GI in regards to considering elective cholecystectomy and HepB management (Dr. Dulce Sellar).   Discharge Vitals:  BP 153/90  Pulse 93  Temp(Src) 98.3 F (36.8 C) (Oral)  Resp 20  Ht 6\' 1"  (1.854 m)  Wt 205 lb 4.8 oz (93.123 kg)  BMI 27.09 kg/m2  SpO2 98% General: resting in bed, NAD  HEENT: PERRL, EOMI, no scleral icterus  Cardiac: RRR, no rubs, murmurs or gallops  Pulm: clear to auscultation bilaterally, moving normal volumes of air  Abd: soft, nontender, nondistended, BS present  Ext: warm and well perfused, no pedal edema  Neuro: alert and oriented X3, cranial nerves II-XII grossly intact  Discharge Labs:  Results for orders placed during the hospital encounter of 02/02/13 (from the past 24 hour(s))  CBC     Status: Abnormal   Collection Time    02/04/13 10:15 AM      Result Value Range   WBC 3.2 (*) 4.0 - 10.5 K/uL   RBC 4.17 (*) 4.22 - 5.81 MIL/uL   Hemoglobin 11.9 (*) 13.0 - 17.0 g/dL   HCT 16.1 (*) 09.6 - 04.5 %   MCV 85.1  78.0 - 100.0 fL   MCH 28.5  26.0 - 34.0 pg   MCHC 33.5  30.0 - 36.0 g/dL   RDW 40.9 (*) 81.1 - 91.4 %   Platelets 59 (*) 150 - 400 K/uL  GLUCOSE, CAPILLARY     Status: None   Collection Time    02/05/13  1:25 AM      Result  Value Range   Glucose-Capillary 97  70 - 99 mg/dL  GLUCOSE, CAPILLARY     Status: Abnormal   Collection Time    02/05/13  5:22 AM      Result Value Range   Glucose-Capillary 106 (*) 70 - 99 mg/dL   Comment 1 Notify RN     Comment 2 Documented in Chart    GLUCOSE, CAPILLARY     Status: Abnormal   Collection Time    02/05/13  7:53 AM      Result Value Range   Glucose-Capillary 115 (*) 70 - 99 mg/dL    Signed: Christen Bame 02/05/2013, 9:33 AM   Time Spent on Discharge: 40 min Services Ordered on Discharge: none Equipment Ordered on Discharge: none

## 2013-02-05 NOTE — Progress Notes (Signed)
Subjective: Pt received 500cc bolus of octreotide (incident clearly outlined in chart). The pat was made aware for this error and has not had any side effects. The pt VSS overnight and no other acute events. Pt without pain this AM. Anticipating d/c.   Objective: Vital signs in last 24 hours: Filed Vitals:   02/04/13 1539 02/04/13 2047 02/05/13 0132 02/05/13 0550  BP: 152/94 153/83 146/96 153/90  Pulse: 76 77 70 93  Temp: 98.5 F (36.9 C) 98.9 F (37.2 C) 98.3 F (36.8 C) 98.3 F (36.8 C)  TempSrc: Oral Oral Oral Oral  Resp: 18 20 18 20   Height:      Weight:    205 lb 4.8 oz (93.123 kg)  SpO2: 98% 96% 99% 98%   Weight change: 8 lb 10.4 oz (3.923 kg)  Intake/Output Summary (Last 24 hours) at 02/05/13 0927 Last data filed at 02/05/13 0659  Gross per 24 hour  Intake   6295 ml  Output   1500 ml  Net   4795 ml   General: resting in bed, NAD HEENT: PERRL, EOMI, no scleral icterus Cardiac: RRR, no rubs, murmurs or gallops Pulm: clear to auscultation bilaterally, moving normal volumes of air Abd: soft, nontender, nondistended, BS present Ext: warm and well perfused, no pedal edema Neuro: alert and oriented X3, cranial nerves II-XII grossly intact  Lab Results: Basic Metabolic Panel:  Recent Labs Lab 02/02/13 1509 02/02/13 1523 02/03/13 0246  NA 136  --  136  K 4.3  --  4.1  CL 105  --  107  CO2 23  --  24  GLUCOSE 92  --  108*  BUN 27*  --  19  CREATININE 0.77  --  0.85  CALCIUM 9.6  --  8.8  MG  --  2.2  --    Liver Function Tests:  Recent Labs Lab 02/02/13 1509  AST 23  ALT 35  ALKPHOS 108  BILITOT 0.5  PROT 7.5  ALBUMIN 3.3*    Recent Labs Lab 02/02/13 1523  LIPASE 15   No results found for this basename: AMMONIA,  in the last 168 hours CBC:  Recent Labs Lab 02/03/13 2046 02/04/13 1015  WBC 3.4* 3.2*  HGB 12.3* 11.9*  HCT 36.9* 35.5*  MCV 86.8 85.1  PLT 69* 59*   Coagulation:  Recent Labs Lab 02/02/13 1510  LABPROT 13.5  INR 1.04    Urine Drug Screen: Drugs of Abuse     Component Value Date/Time   LABOPIA NONE DETECTED 02/02/2013 1545   LABOPIA  Value: POSITIVE (NOTE) Result repeated and verified. Sent for confirmatory testing* 06/05/2010 0629   COCAINSCRNUR NONE DETECTED 02/02/2013 1545   COCAINSCRNUR NEGATIVE 06/05/2010 0629   LABBENZ NONE DETECTED 02/02/2013 1545   LABBENZ  Value: POSITIVE (NOTE) Result repeated and verified. Sent for confirmatory testing* 06/05/2010 0629   AMPHETMU NONE DETECTED 02/02/2013 1545   AMPHETMU NEGATIVE 06/05/2010 0629   THCU NONE DETECTED 02/02/2013 1545   LABBARB NONE DETECTED 02/02/2013 1545    Urinalysis:  Recent Labs Lab 02/02/13 1545  COLORURINE YELLOW  LABSPEC 1.024  PHURINE 6.5  GLUCOSEU NEGATIVE  HGBUR NEGATIVE  BILIRUBINUR NEGATIVE  KETONESUR NEGATIVE  PROTEINUR NEGATIVE  UROBILINOGEN 2.0*  NITRITE NEGATIVE  LEUKOCYTESUR NEGATIVE   Micro Results: Recent Results (from the past 240 hour(s))  MRSA PCR SCREENING     Status: None   Collection Time    02/02/13  8:44 PM      Result Value Range Status  MRSA by PCR NEGATIVE  NEGATIVE Final   Comment:            The GeneXpert MRSA Assay (FDA     approved for NASAL specimens     only), is one component of a     comprehensive MRSA colonization     surveillance program. It is not     intended to diagnose MRSA     infection nor to guide or     monitor treatment for     MRSA infections.   Studies/Results: US Abdomen Complete  02/04/2013   *RADIOLOGY REPORT*  Clinical Data:  Right upper quadrant pain.  Evaluate gallbladder.  ABDOMINAL ULTRASOUND COMPLETE  Comparison:  11/11/2012  Findings:  Gallbladder:  Sludge is noted within the other.  Focal area of gallbladder thickening measures up to 3.4 mm.  No stones.  Negative sonographic Murphy's sign.  Common Bile Duct:  Within normal limits in caliber.  Liver: The liver has a nodular contour suggestive of cirrhosis.  No focal mass lesion identified.  IVC:  Appears normal.   Pancreas:  No abnormality identified.  Spleen:  The spleen is enlarged measuring 14.5 cm.  The volume is equal to 879.7 ml.  Right kidney:  The right kidney measures 13 cm.  Normal size and echogenicity.  There is a cyst within the upper pole measuring 1.4 cm.  Left kidney:  The left kidney appears normal measuring 12.2 cm. There is a cyst within the upper pole measuring 1.5 cm.  Abdominal Aorta:  No aneurysm identified.  IMPRESSION:  1.  Morphologic features of the liver compatible with cirrhosis. 2.  Gallbladder sludge.  The gallbladder wall is prominent measuring up to 3.4 mm.  Negative sonographic Murphy's sign.  No pericholecystic fluid. 3.  Bilateral renal cyst peri 4.  Splenomegaly.   Original Report Authenticated By: Signa Kell, M.D.   Medications: I have reviewed the patient's current medications. Scheduled Meds: . ciprofloxacin  500 mg Oral BID  . pantoprazole  40 mg Oral Daily  . sodium chloride  3 mL Intravenous Q12H   Continuous Infusions: . sodium chloride 100 mL/hr at 02/05/13 0127   PRN Meds:.sodium chloride, albuterol, HYDROmorphone (DILAUDID) injection, ondansetron (ZOFRAN) IV, ondansetron, sodium chloride Assessment/Plan: 1. UGIB: pt has a hx of UGIB and known banded varices. Baseline Hgb 13-15 and today 12 along with FOBT +. EGD 02/03/13 shows varices s/p banding.  -continued protonix po (40mg  daily) as indicated by GI -d/c octreotide gtt -cipro 500mg  BID (for complete 10 day course given esophageal in cirrhosis)  -ADAT -GI following and greatly appreciate recs  2. 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 Dulce Sellar. MELD:7. On presentation LFTs normal and INR 1.04. UDS this admission negative. Abdominal US showed some gallbladder sludge but no acute cholecystitis and -Murphy's sign. This will be followed up at outpt.  -cont to monitor -pt will f/u with GI in regards to considering elective cholecystectomy and HepB management (Dr. Dulce Sellar)   Dispo:  Disposition is deferred at this time, awaiting improvement of current medical problems.  Anticipated discharge in approximately 1-2 day(s).   The patient does have a current PCP [name], [name], MD), therefore will be requiring OPC follow-up after discharge.   The patient does not have transportation limitations that hinder transportation to clinic appointments.  .Services Needed at time of discharge: Y = Yes, Blank = No PT:   OT:   RN:   Equipment:   Other:  LOS: 3 days   Christen Bame 02/05/2013, 9:27 AM Pgr: [phone]

## 2013-02-05 NOTE — Progress Notes (Signed)
Subjective: Some black stool. No hematemesis. Some right-sided abdominal pain (chronic).  Objective: Vital signs in last 24 hours: Temp:  [98.3 F (36.8 C)-98.9 F (37.2 C)] 98.3 F (36.8 C) (05/15 0550) Pulse Rate:  [70-93] 93 (05/15 0550) Resp:  [18-20] 20 (05/15 0550) BP: (146-153)/(83-96) 153/90 mmHg (05/15 0550) SpO2:  [96 %-99 %] 98 % (05/15 0550) Weight:  [93.123 kg (205 lb 4.8 oz)] 93.123 kg (205 lb 4.8 oz) (05/15 0550) Weight change: 3.923 kg (8 lb 10.4 oz) Last BM Date: 02/04/13  PE: GEN:  NAD ABD:  Soft  Lab Results: CBC    Component Value Date/Time   WBC 3.2* 02/04/2013 1015   RBC 4.17* 02/04/2013 1015   HGB 11.9* 02/04/2013 1015   HCT 35.5* 02/04/2013 1015   PLT 59* 02/04/2013 1015   MCV 85.1 02/04/2013 1015   MCH 28.5 02/04/2013 1015   MCHC 33.5 02/04/2013 1015   RDW 15.6* 02/04/2013 1015   LYMPHSABS 1.3 12/27/2012 1932   MONOABS 0.4 12/27/2012 1932   EOSABS 0.4 12/27/2012 1932   BASOSABS 0.0 12/27/2012 1932   Assessment:  1.  Esophageal variceal bleed, resolved. 2.  Right upper quadrant pain, intermittent, chronic, suspect gallbladder-associated.  Plan:  1.  Continue pantoprazole and ciprofloxacin upon discharge, dosing/interval/duration as outlined in my note yesterday. 2.  OK to be discharged home today; I will arrange outpatient follow-up with me Mark Christ[facility] [phone]) in the next few weeks; we will continue to work-up his right upper quadrant pain as outpatient (has already been scheduled for HIDA scan). 3.  Will sign-off; please call with questions.  Thank you for the consult.   Mark Christensen 02/05/2013, 8:20 AM

## 2013-02-05 NOTE — Progress Notes (Signed)
NURSING PROGRESS NOTE  Mark Christensen [id number] Discharge Data: 02/05/2013 10:36 AM Attending Provider: Lars Mage, MD [id number], Leonette Most, MD     [name] to be D/C'd Home per MD order.  Discussed with the patient the After Visit Summary and all questions fully answered. All IV's discontinued with no bleeding noted. All belongings returned to patient for patient to take home.   Last Vital Signs:  Blood pressure 153/90, pulse 93, temperature 98.3 F (36.8 C), temperature source Oral, resp. rate 20, height 6\' 1"  (1.854 m), weight 93.123 kg (205 lb 4.8 oz), SpO2 98.00%.  Discharge Medication List   Medication List    STOP taking these medications       naproxen 500 MG tablet  Commonly known as:  NAPROSYN     omeprazole 20 MG capsule  Commonly known as:  PRILOSEC      TAKE these medications       albuterol 108 (90 BASE) MCG/ACT inhaler  Commonly known as:  PROVENTIL HFA;VENTOLIN HFA  Inhale 1-2 puffs into the lungs every 4 (four) hours as needed for wheezing. For wheezing     amitriptyline 25 MG tablet  Commonly known as:  ELAVIL  Take 25 mg by mouth at bedtime.     ciprofloxacin 500 MG tablet  Commonly known as:  CIPRO  Take 1 tablet (500 mg total) by mouth 2 (two) times daily.     docusate sodium 100 MG capsule  Commonly known as:  COLACE  Take 100 mg by mouth 2 (two) times daily as needed for constipation. For constipation     FLUoxetine 40 MG capsule  Commonly known as:  PROZAC  Take 40 mg by mouth daily.     nabumetone 500 MG tablet  Commonly known as:  RELAFEN  Take 500 mg by mouth 2 (two) times daily.     oxyCODONE 5 MG immediate release tablet  Commonly known as:  Oxy IR/ROXICODONE  Take 1 tablet (5 mg total) by mouth every 4 (four) hours as needed for pain. For pain     pantoprazole 40 MG tablet  Commonly known as:  PROTONIX  Take 1 tablet (40 mg total) by mouth daily.     polyethylene glycol packet  Commonly known as:  MIRALAX / GLYCOLAX   Take 17 g by mouth daily as needed. For constipation     promethazine 25 MG tablet  Commonly known as:  PHENERGAN  Take 1 tablet (25 mg total) by mouth every 6 (six) hours as needed for nausea.     promethazine 25 MG tablet  Commonly known as:  PHENERGAN  Take 25 mg by mouth every 6 (six) hours as needed for nausea.

## 2013-02-05 NOTE — Progress Notes (Signed)
Pt stated that his pain wasn't improved. RN paged MD on call wallace who stated to just continue to monitor pt for now.

## 2013-02-10 ENCOUNTER — Encounter (HOSPITAL_COMMUNITY): Payer: Medicaid Other

## 2013-02-17 ENCOUNTER — Encounter (HOSPITAL_COMMUNITY): Payer: Self-pay | Admitting: Emergency Medicine

## 2013-02-17 ENCOUNTER — Emergency Department (HOSPITAL_COMMUNITY): Payer: Medicaid Other

## 2013-02-17 ENCOUNTER — Emergency Department (HOSPITAL_COMMUNITY)
Admission: EM | Admit: 2013-02-17 | Discharge: 2013-02-17 | Disposition: A | Discharge status: Home / Self Care | Payer: Medicaid Other | Attending: Emergency Medicine | Admitting: Emergency Medicine

## 2013-02-17 DIAGNOSIS — S81009A Unspecified open wound, unspecified knee, initial encounter: Secondary | ICD-10-CM | POA: Insufficient documentation

## 2013-02-17 DIAGNOSIS — R509 Fever, unspecified: Secondary | ICD-10-CM | POA: Insufficient documentation

## 2013-02-17 DIAGNOSIS — G2581 Restless legs syndrome: Secondary | ICD-10-CM | POA: Insufficient documentation

## 2013-02-17 DIAGNOSIS — R61 Generalized hyperhidrosis: Secondary | ICD-10-CM | POA: Insufficient documentation

## 2013-02-17 DIAGNOSIS — F411 Generalized anxiety disorder: Secondary | ICD-10-CM | POA: Insufficient documentation

## 2013-02-17 DIAGNOSIS — S91001S Unspecified open wound, right ankle, sequela: Secondary | ICD-10-CM

## 2013-02-17 DIAGNOSIS — Z79899 Other long term (current) drug therapy: Secondary | ICD-10-CM | POA: Insufficient documentation

## 2013-02-17 DIAGNOSIS — Z9889 Other specified postprocedural states: Secondary | ICD-10-CM | POA: Insufficient documentation

## 2013-02-17 DIAGNOSIS — S91331A Puncture wound without foreign body, right foot, initial encounter: Secondary | ICD-10-CM

## 2013-02-17 DIAGNOSIS — Z8619 Personal history of other infectious and parasitic diseases: Secondary | ICD-10-CM | POA: Insufficient documentation

## 2013-02-17 DIAGNOSIS — Y9389 Activity, other specified: Secondary | ICD-10-CM | POA: Insufficient documentation

## 2013-02-17 DIAGNOSIS — Z8739 Personal history of other diseases of the musculoskeletal system and connective tissue: Secondary | ICD-10-CM | POA: Insufficient documentation

## 2013-02-17 DIAGNOSIS — W268XXA Contact with other sharp object(s), not elsewhere classified, initial encounter: Secondary | ICD-10-CM | POA: Insufficient documentation

## 2013-02-17 DIAGNOSIS — Z8679 Personal history of other diseases of the circulatory system: Secondary | ICD-10-CM | POA: Insufficient documentation

## 2013-02-17 DIAGNOSIS — S91009A Unspecified open wound, unspecified ankle, initial encounter: Secondary | ICD-10-CM | POA: Insufficient documentation

## 2013-02-17 DIAGNOSIS — S91309A Unspecified open wound, unspecified foot, initial encounter: Secondary | ICD-10-CM | POA: Insufficient documentation

## 2013-02-17 DIAGNOSIS — Z8701 Personal history of pneumonia (recurrent): Secondary | ICD-10-CM | POA: Insufficient documentation

## 2013-02-17 DIAGNOSIS — Y929 Unspecified place or not applicable: Secondary | ICD-10-CM | POA: Insufficient documentation

## 2013-02-17 DIAGNOSIS — K219 Gastro-esophageal reflux disease without esophagitis: Secondary | ICD-10-CM | POA: Insufficient documentation

## 2013-02-17 DIAGNOSIS — F17200 Nicotine dependence, unspecified, uncomplicated: Secondary | ICD-10-CM | POA: Insufficient documentation

## 2013-02-17 DIAGNOSIS — M19049 Primary osteoarthritis, unspecified hand: Secondary | ICD-10-CM | POA: Insufficient documentation

## 2013-02-17 DIAGNOSIS — Z23 Encounter for immunization: Secondary | ICD-10-CM | POA: Insufficient documentation

## 2013-02-17 DIAGNOSIS — Z8719 Personal history of other diseases of the digestive system: Secondary | ICD-10-CM | POA: Insufficient documentation

## 2013-02-17 DIAGNOSIS — I10 Essential (primary) hypertension: Secondary | ICD-10-CM | POA: Insufficient documentation

## 2013-02-17 MED ORDER — TETANUS-DIPHTH-ACELL PERTUSSIS 5-2.5-18.5 LF-MCG/0.5 IM SUSP
0.5000 mL | Freq: Once | INTRAMUSCULAR | Status: AC
  Administered 2013-02-17: 0.5 mL via INTRAMUSCULAR
  Filled 2013-02-17: qty 0.5

## 2013-02-17 MED ORDER — OXYCODONE HCL 5 MG PO TABS: 5.0000 mg | ORAL_TABLET | ORAL | Status: DC | PRN

## 2013-02-17 MED ORDER — SULFAMETHOXAZOLE-TRIMETHOPRIM 800-160 MG PO TABS: 1.0000 | ORAL_TABLET | Freq: Two times a day (BID) | ORAL | Status: DC

## 2013-02-17 MED ORDER — OXYCODONE HCL 5 MG PO TABS
5.0000 mg | ORAL_TABLET | Freq: Once | ORAL | Status: AC
  Administered 2013-02-17: 5 mg via ORAL
  Filled 2013-02-17: qty 1

## 2013-02-17 NOTE — ED Notes (Signed)
Pt c/o right ankle swelling and pain. About a month ago pt had a bone infection in ankle and had surgery. Pt unable to sleep d/t pain.

## 2013-02-17 NOTE — ED Provider Notes (Signed)
History     CSN: [id number]  Arrival date & time 02/17/13  1024   First MD Initiated Contact with Patient 02/17/13 1046      Chief Complaint  Patient presents with  . Ankle Pain  . Joint Swelling    (Consider location/radiation/quality/duration/timing/severity/associated sxs/prior treatment) HPI Comments: Patient presents to the ED for right ankle pain and swelling. 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 Was on Bactrim daily via PICC line.  Has been off abx approx 3 weeks. The past 2 days wound has become red, swollen, and TTP. Denies any drainage from wound but pt endorses recent low-grade fevers, sweats, and chills at home.  Patient has been unable to sleep due to extreme pain. Patient also states that earlier this a.m. he stepped on some loose nails while wearing sandals. Nails did puncture his foot.  States he pulled out the nails with pliers.  Denies any FB sensation in foot. Date of last tetanus unknown.   The history is provided by the patient.    Past Medical History  Diagnosis Date  . Hepatic cirrhosis   . Hypertension     NO LONGER TAKING B/P MEDS--STATES DISCONTINUED BY HIS DOCTOR  . Anxiety   . Shortness of breath   . GERD (gastroesophageal reflux disease)   . Restless leg syndrome   . Complication of anesthesia     ENDO/COLONOSCOPY ATTEMPTED IN THE PAST-UNABLE TO ADEQUATELY SEDATE PT-TOLD WOULD NEED ANESTHESIA FOR FUTURE PROCEDURES  . Pneumonia 09-2012  . Headache     HX OF MIGRAINES  . Arthritis     hands  . Hepatitis B 01-30-13    liver enzymes remains elevated  . Hepatitis C   . Wound healing, delayed 01-30-13    3-22-'14 s/p I&D right ankle surgery(infection) -minimal drainage with dressing    Past Surgical History  Procedure Laterality Date  . Appendectomy    . Esophageal banding      FOR TX OF GI BLEED ABOUT 2008  . Arm debridement  08-11-12    right forearm wound with debridement 2 months ago/ right abdomen also-healing well  . Multiple  tooth extractions  08/12/2012  . Colonoscopy with propofol  08/13/2012    Procedure: COLONOSCOPY WITH PROPOFOL;  Surgeon: Willis Modena, MD;  Location: [facility] ENDOSCOPY;  Service: Endoscopy;  Laterality: N/A;  . 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 Esophagogastroduodenoscopy N/A 11/18/2012    Procedure: ESOPHAGOGASTRODUODENOSCOPY (EGD);  Surgeon: Shirley Friar, MD;  Location: [facility] ENDOSCOPY;  Service: Endoscopy;  Laterality: N/A;  . Esophageal banding  11/18/2012    Procedure: ESOPHAGEAL BANDING;  Surgeon: Shirley Friar, MD;  Location: [facility] ENDOSCOPY;  Service: Endoscopy;;  . I&d extremity Right 12/13/2012    Procedure: IRRIGATION AND DEBRIDEMENT EXTREMITY;  Surgeon: Shelda Pal, MD;  [facility];  Service: Orthopedics;  Laterality: Right;  . Ankle surgery      Debridement of ankle 12-13-12-/ Dr. Ashley Royalty  . Peripherally inserted central catheter insertion      insertion and removal(IVR)  . Esophagogastroduodenoscopy N/A 02/03/2013    Procedure: ESOPHAGOGASTRODUODENOSCOPY (EGD);  Surgeon: Shirley Friar, MD;  Location: [facility] ENDOSCOPY;  Service: Endoscopy;  Laterality: N/A;    No family history on file.  History  Substance Use Topics  . Smoking status: Current Every Day Smoker -- 0.50 packs/day for 30 years    Types: Cigarettes  . Smokeless tobacco: Never Used  . Alcohol  Use: No     Comment: RARELY USES ALCOHOL      Review of Systems  Musculoskeletal: Positive for arthralgias.  Skin: Positive for wound.  All other systems reviewed and are negative.    Allergies  Tylenol; Asa; Morphine and related; and Tramadol  Home Medications   Current Outpatient Rx  Name  Route  Sig  Dispense  Refill  . albuterol (PROVENTIL HFA;VENTOLIN HFA) 108 (90 BASE) MCG/ACT inhaler   Inhalation   Inhale 1-2 puffs into the lungs every 4 (four) hours as needed for wheezing. For wheezing         . amitriptyline (ELAVIL) 25 MG tablet   Oral    Take 25 mg by mouth at bedtime.         . ciprofloxacin (CIPRO) 500 MG tablet   Oral   Take 1 tablet (500 mg total) by mouth 2 (two) times daily.   16 tablet   0   . docusate sodium (COLACE) 100 MG capsule   Oral   Take 100 mg by mouth 2 (two) times daily as needed for constipation. For constipation         . FLUoxetine (PROZAC) 40 MG capsule   Oral   Take 40 mg by mouth daily.         . nabumetone (RELAFEN) 500 MG tablet   Oral   Take 500 mg by mouth 2 (two) times daily.         Marland Kitchen oxyCODONE (OXY IR/ROXICODONE) 5 MG immediate release tablet   Oral   Take 1 tablet (5 mg total) by mouth every 4 (four) hours as needed for pain. For pain   30 tablet   0   . pantoprazole (PROTONIX) 40 MG tablet   Oral   Take 1 tablet (40 mg total) by mouth daily.   30 tablet   11   . polyethylene glycol (MIRALAX / GLYCOLAX) packet   Oral   Take 17 g by mouth daily as needed. For constipation         . EXPIRED: promethazine (PHENERGAN) 25 MG tablet   Oral   Take 1 tablet (25 mg total) by mouth every 6 (six) hours as needed for nausea.   20 tablet   0   . promethazine (PHENERGAN) 25 MG tablet   Oral   Take 25 mg by mouth every 6 (six) hours as needed for nausea.           BP 154/105  Pulse 82  Temp(Src) 97.5 F (36.4 C) (Oral)  SpO2 100%  Physical Exam  Nursing note and vitals reviewed. Constitutional: He is oriented to person, place, and time. He appears well-developed and well-nourished.  HENT:  Head: Normocephalic and atraumatic.  Eyes: Conjunctivae and EOM are normal.  Neck: Normal range of motion. Neck supple.  Cardiovascular: Normal rate, regular rhythm and normal heart sounds.   Pulmonary/Chest: Breath sounds normal. No respiratory distress. He has no wheezes.  Musculoskeletal:       Right ankle: He exhibits swelling and laceration. Tenderness. Medial malleolus tenderness found. Achilles tendon normal.       Feet:  2 small puncture wounds to sole of right  foot, no FB or signs of infection  Neurological: He is alert and oriented to person, place, and time.  Skin: Skin is warm and dry.  Psychiatric: He has a normal mood and affect.    ED Course  Procedures (including critical care time)  Labs Reviewed - No data to  display Dg Ankle Complete Right  02/17/2013   *RADIOLOGY REPORT*  Clinical Data: Right medial ankle pain, swelling.  Recent surgery.  RIGHT ANKLE - COMPLETE 3+ VIEW  Comparison: 12/13/2012  Findings: No acute bony abnormality.  Specifically, no fracture, subluxation, or dislocation.  Soft tissues are intact. Joint spaces are maintained.  Normal bone mineralization.  IMPRESSION: No acute bony abnormality.   Original Report Authenticated By: Charlett Nose, M.D.   Dg Foot Complete Right  02/17/2013   *RADIOLOGY REPORT*  Clinical Data: Stepped on nail.  Foot pain.  RIGHT FOOT COMPLETE - 3+ VIEW  Comparison: None  Findings: Mild pes planus.  No radiopaque foreign bodies within the soft tissues.  Moderate to advanced degenerative changes at the first MTP joint.  No fracture, subluxation or dislocation.  IMPRESSION: No acute bony abnormality.   Original Report Authenticated By: Charlett Nose, M.D.     1. Open ankle wound, right, sequela   2. Puncture wound of right foot, initial encounter       MDM   52 year old male presenting to the ED for right ankle pain and swelling. Patient has history of osteomyelitis in the ankle. Patient also reports he stepped on some nails this morning while wearing sandals- they did puncture his foot.  X-rays negative for acute osteomyelitis or FB.  Tetanus updated.  Rx bactrim and oxycodone.  Continue to keep wound covered while working outside or wearing work boots.  Continue home wound care. FU with PCP if sx not improving in the next few days.  Discussed plan with pt, he agreed.  Return precautions advised.   Garlon Hatchet, PA-C 02/17/13 1539

## 2013-02-17 NOTE — ED Notes (Signed)
Pt stated that he knows about his high BP and that he has an appointment with PCP about it.

## 2013-02-18 ENCOUNTER — Ambulatory Visit (HOSPITAL_COMMUNITY): Admission: RE | Admit: 2013-02-18 | Payer: Medicaid Other | Source: Ambulatory Visit | Admitting: Gastroenterology

## 2013-02-18 ENCOUNTER — Encounter (HOSPITAL_COMMUNITY): Admission: RE | Payer: Self-pay | Source: Ambulatory Visit

## 2013-02-18 HISTORY — DX: Other injury of unspecified body region, subsequent encounter: T14.8XXD

## 2013-02-18 SURGERY — ESOPHAGOGASTRODUODENOSCOPY (EGD) WITH PROPOFOL
Anesthesia: Monitor Anesthesia Care

## 2013-02-19 NOTE — ED Provider Notes (Signed)
Medical screening examination/treatment/procedure(s) were performed by non-physician practitioner and as supervising physician I was immediately available for consultation/collaboration.   Ayiana Winslett Y. Meghna Hagmann, MD 02/19/13 1254 

## 2013-03-07 ENCOUNTER — Encounter (HOSPITAL_COMMUNITY): Payer: Self-pay | Admitting: *Deleted

## 2013-03-07 ENCOUNTER — Inpatient Hospital Stay (HOSPITAL_COMMUNITY)
Admission: EM | Admit: 2013-03-07 | Discharge: 2013-03-09 | DRG: 432 | Discharge status: Against Medical Advice | Payer: Medicaid Other | Attending: Internal Medicine | Admitting: Internal Medicine

## 2013-03-07 ENCOUNTER — Emergency Department (HOSPITAL_COMMUNITY): Payer: Medicaid Other

## 2013-03-07 ENCOUNTER — Encounter (HOSPITAL_COMMUNITY)
Admission: EM | Discharge status: Against Medical Advice | Payer: Self-pay | Source: Home / Self Care | Attending: Internal Medicine

## 2013-03-07 DIAGNOSIS — D696 Thrombocytopenia, unspecified: Secondary | ICD-10-CM | POA: Diagnosis present

## 2013-03-07 DIAGNOSIS — K920 Hematemesis: Secondary | ICD-10-CM

## 2013-03-07 DIAGNOSIS — R109 Unspecified abdominal pain: Secondary | ICD-10-CM | POA: Diagnosis present

## 2013-03-07 DIAGNOSIS — I8511 Secondary esophageal varices with bleeding: Secondary | ICD-10-CM | POA: Diagnosis present

## 2013-03-07 DIAGNOSIS — I10 Essential (primary) hypertension: Secondary | ICD-10-CM | POA: Diagnosis present

## 2013-03-07 DIAGNOSIS — K7030 Alcoholic cirrhosis of liver without ascites: Principal | ICD-10-CM | POA: Diagnosis present

## 2013-03-07 DIAGNOSIS — K7460 Unspecified cirrhosis of liver: Secondary | ICD-10-CM

## 2013-03-07 DIAGNOSIS — B1920 Unspecified viral hepatitis C without hepatic coma: Secondary | ICD-10-CM | POA: Diagnosis present

## 2013-03-07 DIAGNOSIS — M19049 Primary osteoarthritis, unspecified hand: Secondary | ICD-10-CM | POA: Diagnosis present

## 2013-03-07 DIAGNOSIS — B1910 Unspecified viral hepatitis B without hepatic coma: Secondary | ICD-10-CM | POA: Diagnosis present

## 2013-03-07 DIAGNOSIS — K319 Disease of stomach and duodenum, unspecified: Secondary | ICD-10-CM | POA: Diagnosis present

## 2013-03-07 DIAGNOSIS — Z8619 Personal history of other infectious and parasitic diseases: Secondary | ICD-10-CM

## 2013-03-07 DIAGNOSIS — G2581 Restless legs syndrome: Secondary | ICD-10-CM | POA: Diagnosis present

## 2013-03-07 DIAGNOSIS — F411 Generalized anxiety disorder: Secondary | ICD-10-CM | POA: Diagnosis present

## 2013-03-07 DIAGNOSIS — K219 Gastro-esophageal reflux disease without esophagitis: Secondary | ICD-10-CM | POA: Diagnosis present

## 2013-03-07 DIAGNOSIS — K922 Gastrointestinal hemorrhage, unspecified: Secondary | ICD-10-CM

## 2013-03-07 DIAGNOSIS — F1020 Alcohol dependence, uncomplicated: Secondary | ICD-10-CM | POA: Diagnosis present

## 2013-03-07 HISTORY — PX: ESOPHAGEAL BANDING: SHX5518

## 2013-03-07 HISTORY — PX: ESOPHAGOGASTRODUODENOSCOPY: SHX5428

## 2013-03-07 LAB — COMPREHENSIVE METABOLIC PANEL
ALT: 48 U/L (ref 0–53)
AST: 50 U/L — ABNORMAL HIGH (ref 0–37)
Albumin: 3.3 g/dL — ABNORMAL LOW (ref 3.5–5.2)
Calcium: 10.4 mg/dL (ref 8.4–10.5)
Creatinine, Ser: 0.82 mg/dL (ref 0.50–1.35)
GFR calc non Af Amer: >90 mL/min (ref >90)
Sodium: 137 mEq/L (ref 135–145)
Total Protein: 8.3 g/dL (ref 6.0–8.3)

## 2013-03-07 LAB — PROTIME-INR
INR: 1.05 (ref 0.00–1.49)
Prothrombin Time: 13.6 seconds (ref 11.6–15.2)

## 2013-03-07 LAB — TYPE AND SCREEN
ABO/RH(D): O POS
Antibody Screen: NEGATIVE

## 2013-03-07 LAB — TROPONIN I: Troponin I: <0.3 ng/mL (ref <0.30)

## 2013-03-07 LAB — MRSA PCR SCREENING: MRSA by PCR: NEGATIVE

## 2013-03-07 LAB — LIPASE, BLOOD: Lipase: 23 U/L (ref 11–59)

## 2013-03-07 SURGERY — EGD (ESOPHAGOGASTRODUODENOSCOPY)
Anesthesia: Moderate Sedation

## 2013-03-07 MED ORDER — ONDANSETRON HCL 4 MG PO TABS: 4.0000 mg | ORAL_TABLET | Freq: Four times a day (QID) | ORAL | Status: DC | PRN

## 2013-03-07 MED ORDER — MIDAZOLAM HCL 10 MG/2ML IJ SOLN
INTRAMUSCULAR | Status: DC | PRN
  Administered 2013-03-07: 1 mg via INTRAVENOUS
  Administered 2013-03-07: 2 mg via INTRAVENOUS
  Administered 2013-03-07: 1 mg via INTRAVENOUS
  Administered 2013-03-07: 2 mg via INTRAVENOUS

## 2013-03-07 MED ORDER — SODIUM CHLORIDE 0.9 % IV SOLN
25.0000 ug/h | INTRAVENOUS | Status: DC
  Administered 2013-03-07 – 2013-03-08 (×3): 50 ug/h via INTRAVENOUS
  Administered 2013-03-09: 25 ug/h via INTRAVENOUS
  Filled 2013-03-07 (×7): qty 1

## 2013-03-07 MED ORDER — MORPHINE SULFATE 2 MG/ML IJ SOLN
2.0000 mg | INTRAMUSCULAR | Status: DC | PRN
  Administered 2013-03-07 (×3): 2 mg via INTRAVENOUS
  Filled 2013-03-07 (×5): qty 1

## 2013-03-07 MED ORDER — ALBUTEROL SULFATE HFA 108 (90 BASE) MCG/ACT IN AERS: 1.0000 | INHALATION_SPRAY | RESPIRATORY_TRACT | Status: DC | PRN

## 2013-03-07 MED ORDER — SODIUM CHLORIDE 0.9 % IV SOLN
8.0000 mg/h | INTRAVENOUS | Status: DC
  Administered 2013-03-07 – 2013-03-08 (×3): 8 mg/h via INTRAVENOUS
  Filled 2013-03-07 (×5): qty 80

## 2013-03-07 MED ORDER — ONDANSETRON HCL 4 MG/2ML IJ SOLN
4.0000 mg | Freq: Once | INTRAMUSCULAR | Status: AC
  Administered 2013-03-07: 4 mg via INTRAVENOUS

## 2013-03-07 MED ORDER — SODIUM CHLORIDE 0.9 % IV SOLN
80.0000 mg | Freq: Once | INTRAVENOUS | Status: AC
  Administered 2013-03-07: 80 mg via INTRAVENOUS
  Filled 2013-03-07: qty 80

## 2013-03-07 MED ORDER — SODIUM CHLORIDE 0.9 % IJ SOLN
3.0000 mL | Freq: Two times a day (BID) | INTRAMUSCULAR | Status: DC
  Administered 2013-03-07 – 2013-03-08 (×3): 3 mL via INTRAVENOUS

## 2013-03-07 MED ORDER — ONDANSETRON HCL 4 MG/2ML IJ SOLN
4.0000 mg | Freq: Once | INTRAMUSCULAR | Status: AC
  Administered 2013-03-07: 4 mg via INTRAVENOUS
  Filled 2013-03-07: qty 2

## 2013-03-07 MED ORDER — SODIUM CHLORIDE 0.9 % IV SOLN
INTRAVENOUS | Status: DC
Start: 2013-03-07 — End: 2013-03-07

## 2013-03-07 MED ORDER — BUTAMBEN-TETRACAINE-BENZOCAINE 2-2-14 % EX AERO
INHALATION_SPRAY | CUTANEOUS | Status: DC | PRN
  Administered 2013-03-07: 2 via TOPICAL

## 2013-03-07 MED ORDER — HYDROMORPHONE HCL PF 1 MG/ML IJ SOLN
1.0000 mg | Freq: Once | INTRAMUSCULAR | Status: AC
  Administered 2013-03-07: 1 mg via INTRAVENOUS
  Filled 2013-03-07: qty 1

## 2013-03-07 MED ORDER — DIPHENHYDRAMINE HCL 50 MG/ML IJ SOLN
INTRAMUSCULAR | Status: DC | PRN
  Administered 2013-03-07: 25 mg via INTRAVENOUS

## 2013-03-07 MED ORDER — ONDANSETRON HCL 4 MG/2ML IJ SOLN
INTRAMUSCULAR | Status: AC
  Filled 2013-03-07: qty 2

## 2013-03-07 MED ORDER — DEXTROSE 5 % IV SOLN
1.0000 g | INTRAVENOUS | Status: DC
  Administered 2013-03-07 – 2013-03-08 (×2): 1 g via INTRAVENOUS
  Filled 2013-03-07 (×5): qty 10

## 2013-03-07 MED ORDER — FENTANYL CITRATE 0.05 MG/ML IJ SOLN
INTRAMUSCULAR | Status: DC | PRN
  Administered 2013-03-07 (×3): 25 ug via INTRAVENOUS

## 2013-03-07 MED ORDER — SODIUM CHLORIDE 0.9 % IV SOLN
INTRAVENOUS | Status: DC
  Administered 2013-03-07 – 2013-03-08 (×2): via INTRAVENOUS

## 2013-03-07 MED ORDER — ONDANSETRON HCL 4 MG/2ML IJ SOLN: 4.0000 mg | Freq: Four times a day (QID) | INTRAMUSCULAR | Status: DC | PRN

## 2013-03-07 NOTE — Consult Note (Signed)
Reason for Consult: Upper GI bleeding Referring Physician: Hospital team  Mark Christensen is an 52 y.o. male.  HPI: Patient with known cirrhosis and history of varices and portal gastropathy and was banded last month who threw up bright red blood and clots after breakfast and presented to the emergency room and did notice one black stool earlier today and he denies any aspirin or nonsteroidals over-the-counter was started on Relafen about a month ago by orthopedist and has not been back to drinking and his baseline abdominal pain is unchanged and he has no other complaint  Past Medical History  Diagnosis Date  . Hepatic cirrhosis   . Hypertension     NO LONGER TAKING B/P MEDS--STATES DISCONTINUED BY HIS DOCTOR  . Anxiety   . Shortness of breath   . GERD (gastroesophageal reflux disease)   . Restless leg syndrome   . Complication of anesthesia     ENDO/COLONOSCOPY ATTEMPTED IN THE PAST-UNABLE TO ADEQUATELY SEDATE PT-TOLD WOULD NEED ANESTHESIA FOR FUTURE PROCEDURES  . Pneumonia 09-2012  . Headache(784.0)     HX OF MIGRAINES  . Arthritis     hands  . Hepatitis B 01-30-13    liver enzymes remains elevated  . Hepatitis C   . Wound healing, delayed 01-30-13    3-22-'14 s/p I&D right ankle surgery(infection) -minimal drainage with dressing    Past Surgical History  Procedure Laterality Date  . Appendectomy    . Esophageal banding      FOR TX OF GI BLEED ABOUT 2008  . Arm debridement  08-11-12    right forearm wound with debridement 2 months ago/ right abdomen also-healing well  . Multiple tooth extractions  08/12/2012  . Colonoscopy with propofol  08/13/2012    Procedure: COLONOSCOPY WITH PROPOFOL;  Surgeon: Willis Modena, MD;  Location: [facility] ENDOSCOPY;  Service: Endoscopy;  Laterality: N/A;  . 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 Esophagogastroduodenoscopy N/A 11/18/2012    Procedure: ESOPHAGOGASTRODUODENOSCOPY (EGD);  Surgeon: Shirley Friar, MD;   Location: [facility] ENDOSCOPY;  Service: Endoscopy;  Laterality: N/A;  . Esophageal banding  11/18/2012    Procedure: ESOPHAGEAL BANDING;  Surgeon: Shirley Friar, MD;  Location: [facility] ENDOSCOPY;  Service: Endoscopy;;  . I&d extremity Right 12/13/2012    Procedure: IRRIGATION AND DEBRIDEMENT EXTREMITY;  Surgeon: Shelda Pal, MD;  Location: [facility] OR;  Service: Orthopedics;  Laterality: Right;  . Ankle surgery      Debridement of ankle 12-13-12-[place]/ Dr. Ashley Royalty  . Peripherally inserted central catheter insertion      insertion and removal(IVR)  . Esophagogastroduodenoscopy N/A 02/03/2013    Procedure: ESOPHAGOGASTRODUODENOSCOPY (EGD);  Surgeon: Shirley Friar, MD;  Location: [facility] ENDOSCOPY;  Service: Endoscopy;  Laterality: N/A;    History reviewed. No pertinent family history.  Social History:  reports that he has been smoking Cigarettes.  He has a 15 pack-year smoking history. He has never used smokeless tobacco. He reports that he does not drink alcohol or use illicit drugs.  Allergies:  Allergies  Allergen Reactions  . Tylenol (Acetaminophen) Other (See Comments)    Cirrhosis  . Asa (Aspirin) Other (See Comments)    cirrhosis  . Morphine And Related Itching  . Tramadol Nausea And Vomiting and Swelling    Abdominal swelling     Medications: I have reviewed the patient's current medications.  Results for orders placed during the hospital encounter of 03/07/13 (from the past 48 hour(s))  LACTIC ACID, PLASMA     Status: None   Collection Time    03/07/13 10:03 AM      Result Value Range   Lactic Acid, Venous 1.0  0.5 - 2.2 mmol/L  CBC WITH DIFFERENTIAL     Status: Abnormal   Collection Time    03/07/13 10:08 AM      Result Value Range   WBC 7.1  4.0 - 10.5 K/uL   RBC 4.76  4.22 - 5.81 MIL/uL   Hemoglobin 13.9  13.0 - 17.0 g/dL   HCT 40.9  81.1 - 91.4 %   MCV 84.7  78.0 - 100.0 fL   MCH 29.2  26.0 - 34.0 pg   MCHC 34.5  30.0 - 36.0 g/dL   RDW 78.2 (*) 95.6 - 21.3 %    Platelets 111 (*) 150 - 400 K/uL   Neutrophils Relative % 62  43 - 77 %   Neutro Abs 4.4  1.7 - 7.7 K/uL   Lymphocytes Relative 25  12 - 46 %   Lymphs Abs 1.8  0.7 - 4.0 K/uL   Monocytes Relative 9  3 - 12 %   Monocytes Absolute 0.7  0.1 - 1.0 K/uL   Eosinophils Relative 2  0 - 5 %   Eosinophils Absolute 0.2  0.0 - 0.7 K/uL   Basophils Relative 1  0 - 1 %   Basophils Absolute 0.1  0.0 - 0.1 K/uL  COMPREHENSIVE METABOLIC PANEL     Status: Abnormal   Collection Time    03/07/13 10:08 AM      Result Value Range   Sodium 137  135 - 145 mEq/L   Potassium 4.4  3.5 - 5.1 mEq/L   Chloride 102  96 - 112 mEq/L   CO2 25  19 - 32 mEq/L   Glucose, Bld 104 (*) 70 - 99 mg/dL   BUN 21  6 - 23 mg/dL   Creatinine, Ser 0.86  0.50 - 1.35 mg/dL   Calcium 57.8  8.4 - 46.9 mg/dL   Total Protein 8.3  6.0 - 8.3 g/dL   Albumin 3.3 (*) 3.5 - 5.2 g/dL   AST 50 (*) 0 - 37 U/L   ALT 48  0 - 53 U/L   Alkaline Phosphatase 170 (*) 39 - 117 U/L   Total Bilirubin 0.3  0.3 - 1.2 mg/dL   GFR calc non Af Amer >90  >90 mL/min   GFR calc Af Amer >90  >90 mL/min   Comment:            The eGFR has been calculated     using the CKD EPI equation.     This calculation has not been     validated in all clinical     situations.     eGFR's persistently     <90 mL/min signify     possible Chronic Kidney Disease.  LIPASE, BLOOD     Status: None   Collection Time    03/07/13 10:08 AM      Result Value Range   Lipase 23  11 - 59 U/L  PROTIME-INR     Status: None   Collection Time    03/07/13 10:08 AM      Result Value Range   Prothrombin Time 13.6  11.6 - 15.2 seconds   INR 1.05  0.00 - 1.49  TYPE AND SCREEN     Status: None   Collection Time    03/07/13 10:50 AM  Result Value Range   ABO/RH(D) O POS     Antibody Screen NEG     Sample Expiration 03/10/2013      Dg Abd Acute W/chest  03/07/2013   *RADIOLOGY REPORT*  Clinical Data: GI bleeding  ACUTE ABDOMEN SERIES (ABDOMEN 2 VIEW & CHEST 1 VIEW)   Comparison: Prior chest x-ray 02/02/2013; abdominal ultrasound 02/04/2013; prior CT abdomen and pelvis 12/07/2011  Findings: The lungs are clear.  No pulmonary edema, focal consolidation, pleural effusion or pneumothorax.  Stable cardiac and mediastinal contours which within normal limits.  Mild central bronchitic changes. Stable calcification versus other density in the mid epigastric region.  On prior CT, this is on the undersurface of the anterior abdominal wall.  Nonspecific bowel gas pattern.  Gas and stool noted throughout the colon to the level of the rectum without evidence of obstruction.  Prominence of the hepatic and splenic shadows.  No definite organic calcification. 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  No free air.  IMPRESSION:  1.  No acute cardiopulmonary disease. 2.  Nonobstructed bowel gas pattern. 3.  Prominent hepatic and splenic shadows.  Query hepatic splenomegaly.   Original Report Authenticated By: Malachy Moan, M.D.    ROS negative except above he takes Relafen for his hand Blood pressure 137/66, pulse 76, temperature 98 F (36.7 C), temperature source Oral, resp. rate 14, height 6\' 4"  (1.93 m), weight 92.987 kg (205 lb), SpO2 98.00%. Physical Exam vital signs stable afebrile no acute distress exam please see pre-assessment evaluation minimal midepigastric discomfort only labs stable hospital and office chart reviewed  Assessment/Plan: Patient with cirrhosis and known varices and portal gastropathy on Relafen with upper GI bleeding Plan: The risks benefits methods of endoscopy was discussed with proceed ASAP with further workup and plans pending those findings and would stop Relafen and not use any nonsteroidals going forward and not sure the risk of Cox inhibitors is worth it either  [facility] E 03/07/2013, 2:17 PM

## 2013-03-07 NOTE — ED Notes (Signed)
Transported to endo for EGD

## 2013-03-07 NOTE — Progress Notes (Signed)
Bed alarm ON per protocol for Fall Prevention for new admissions. While passing room, patient noted to be turning bed alarm OFF, explained high risk for falls to patient. Patient states understanding. Will monitor and notify oncoming RN.

## 2013-03-07 NOTE — H&P (Signed)
Date: 03/07/2013               Patient Name:  Mark Christensen MRN: [id number]  DOB: 1961-01-11 Age / Sex: 52 y.o., male   PCP: Kaleen Mask, MD         Medical Service: Internal Medicine Teaching Service         Attending Physician: Dr. Rogelia Boga    First Contact: Dr. Elenor Legato Pager: [phone]  Second Contact: Dr. Suszanne Conners Pager: [phone]       After Hours (After 5p/  First Contact Pager: [phone]  weekends / holidays): Second Contact Pager: [phone]   Chief Complaint: hematemesis  History of Present Illness: Mark Christensen is a 52 yo man with a PMH of Hep B, Hep C, hepatic cirrhosis with documented esophageal varices s/p banding who presents to the ED with complaints of hematemesis. He admits to three episodes of vomiting gross blood which began this morning. States he had a BM consisting of green stool this AM but denies any blood per rectum or melanotic stools. Denies any abdominal pain worsened over baseline RUQ pain. Denies any recent illness, fever, or chills. States he had his spironolactone restarted by his PCP ~2 weeks ago but denies any other changes in his medications and states he has been compliant with his medication regimen as prescribed.  Mark Christensen was admitted ~1 month ago for similar complaints and was ultimately discharged on protonix and ciprofloxacin after having an EGD which revealed varices s/p banding.    Meds: Current Facility-Administered Medications  Medication Dose Route Frequency Provider Last Rate Last Dose  . octreotide (SANDOSTATIN) 2 mcg/mL in sodium chloride 0.9 % 250 mL infusion  50 mcg/hr Intravenous Continuous Gerhard Munch, MD 25 mL/hr at 03/07/13 1106 50 mcg/hr at 03/07/13 1106  . pantoprazole (PROTONIX) 80 mg in sodium chloride 0.9 % 250 mL infusion  8 mg/hr Intravenous Continuous Gerhard Munch, MD 25 mL/hr at 03/07/13 1202 8 mg/hr at 03/07/13 1202   Current Outpatient Prescriptions  Medication Sig Dispense Refill  . albuterol  (PROVENTIL HFA;VENTOLIN HFA) 108 (90 BASE) MCG/ACT inhaler Inhale 1-2 puffs into the lungs every 4 (four) hours as needed for wheezing. For wheezing      . amitriptyline (ELAVIL) 25 MG tablet Take 25 mg by mouth at bedtime.      . ciprofloxacin (CIPRO) 500 MG tablet Take 1 tablet (500 mg total) by mouth 2 (two) times daily.  16 tablet  0  . docusate sodium (COLACE) 100 MG capsule Take 100 mg by mouth 2 (two) times daily as needed for constipation. For constipation      . FLUoxetine (PROZAC) 40 MG capsule Take 40 mg by mouth daily.      . nabumetone (RELAFEN) 500 MG tablet Take 500 mg by mouth 2 (two) times daily.      Marland Kitchen oxyCODONE (OXY IR/ROXICODONE) 5 MG immediate release tablet Take 1 tablet (5 mg total) by mouth every 4 (four) hours as needed for pain. For pain  30 tablet  0  . oxyCODONE (ROXICODONE) 5 MG immediate release tablet Take 1 tablet (5 mg total) by mouth every 4 (four) hours as needed for pain.  15 tablet  0  . pantoprazole (PROTONIX) 40 MG tablet Take 1 tablet (40 mg total) by mouth daily.  30 tablet  11  . polyethylene glycol (MIRALAX / GLYCOLAX) packet Take 17 g by mouth daily as needed. For constipation      . promethazine (PHENERGAN) 25 MG  tablet Take 1 tablet (25 mg total) by mouth every 6 (six) hours as needed for nausea.  20 tablet  0  . promethazine (PHENERGAN) 25 MG tablet Take 25 mg by mouth every 6 (six) hours as needed for nausea.      Marland Kitchen sulfamethoxazole-trimethoprim (SEPTRA DS) 800-160 MG per tablet Take 1 tablet by mouth every 12 (twelve) hours.  20 tablet  0    Allergies: Allergies as of 03/07/2013 - Review Complete 03/07/2013  Allergen Reaction Noted  . Tylenol (acetaminophen) Other (See Comments) 12/06/2011  . Asa (aspirin) Other (See Comments) 02/02/2013  . Morphine and related Itching 11/09/2011  . Tramadol Nausea And Vomiting and Swelling 06/29/2012   Past Medical History  Diagnosis Date  . Hepatic cirrhosis   . Hypertension     NO LONGER TAKING B/P  MEDS--STATES DISCONTINUED BY HIS DOCTOR  . Anxiety   . Shortness of breath   . GERD (gastroesophageal reflux disease)   . Restless leg syndrome   . Complication of anesthesia     ENDO/COLONOSCOPY ATTEMPTED IN THE PAST-UNABLE TO ADEQUATELY SEDATE PT-TOLD WOULD NEED ANESTHESIA FOR FUTURE PROCEDURES  . Pneumonia 09-2012  . Headache(784.0)     HX OF MIGRAINES  . Arthritis     hands  . Hepatitis B 01-30-13    liver enzymes remains elevated  . Hepatitis C   . Wound healing, delayed 01-30-13    3-22-'14 s/p I&D right ankle surgery(infection) -minimal drainage with dressing   Past Surgical History  Procedure Laterality Date  . Appendectomy    . Esophageal banding      FOR TX OF GI BLEED ABOUT 2008  . Arm debridement  08-11-12    right forearm wound with debridement 2 months ago/ right abdomen also-healing well  . Multiple tooth extractions  08/12/2012  . Colonoscopy with propofol  08/13/2012    Procedure: COLONOSCOPY WITH PROPOFOL;  Surgeon: Willis Modena, MD;  Location: [facility] ENDOSCOPY;  Service: Endoscopy;  Laterality: N/A;  . 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 Esophagogastroduodenoscopy N/A 11/18/2012    Procedure: ESOPHAGOGASTRODUODENOSCOPY (EGD);  Surgeon: Shirley Friar, MD;  Location: [facility] ENDOSCOPY;  Service: Endoscopy;  Laterality: N/A;  . Esophageal banding  11/18/2012    Procedure: ESOPHAGEAL BANDING;  Surgeon: Shirley Friar, MD;  Location: [facility] ENDOSCOPY;  Service: Endoscopy;;  . I&d extremity Right 12/13/2012    Procedure: IRRIGATION AND DEBRIDEMENT EXTREMITY;  Surgeon: Shelda Pal, MD;  Location: [facility] OR;  Service: Orthopedics;  Laterality: Right;  . Ankle surgery      Debridement of ankle 12-13-12-[place]/ Dr. Ashley Royalty  . Peripherally inserted central catheter insertion      insertion and removal(IVR)  . Esophagogastroduodenoscopy N/A 02/03/2013    Procedure: ESOPHAGOGASTRODUODENOSCOPY (EGD);  Surgeon: Shirley Friar, MD;  Location:  [facility] ENDOSCOPY;  Service: Endoscopy;  Laterality: N/A;   No family history on file. History   Social History  . Marital Status: Married    Spouse Name: N/A    Number of Children: N/A  . Years of Education: N/A   Occupational History  . Not on file.   Social History Main Topics  . Smoking status: Current Every Day Smoker -- 0.50 packs/day for 30 years    Types: Cigarettes  . Smokeless tobacco: Never Used  . Alcohol Use: No     Comment: RARELY USES ALCOHOL  . Drug Use: No     Comment: HX OF POSITIVE URINE  DRUG SCREEN FOR COCAINE--PT DENIES USE OF COCAINE  . Sexually Active: Yes   Other Topics Concern  . Not on file   Social History Narrative  . No narrative on file    Review of Systems: Pertinent items noted in HPI.     Physical Exam: Blood pressure 155/105, pulse 89, temperature 98.5 F (36.9 C), temperature source Oral, resp. rate 15, SpO2 97.00%. Physical Exam  Constitutional: He is oriented to person, place, and time and well-developed, well-nourished, and in no distress. No distress.  HENT:  Head: Normocephalic and atraumatic.  Eyes: Conjunctivae are normal. Pupils are equal, round, and reactive to light. No scleral icterus.  Neck: Normal range of motion. Neck supple. No tracheal deviation present.  Cardiovascular: Normal rate and regular rhythm.   No murmur heard. Pulmonary/Chest: Effort normal. He has no wheezes. He has no rales.  Abdominal: Soft. Bowel sounds are normal. He exhibits no distension. There is tenderness (Minimal RUQ tenderness. ). There is no rebound and no guarding.  Musculoskeletal: Normal range of motion. He exhibits no edema.  Neurological: He is alert and oriented to person, place, and time. No cranial nerve deficit.  Skin: Skin is warm and dry. He is not diaphoretic. No erythema.  Psychiatric: Affect and judgment normal.    Lab results: Basic Metabolic Panel:  Recent Labs  14/78/29 1008  NA 137  K 4.4  CL 102  CO2 25  GLUCOSE  104*  BUN 21  CREATININE 0.82  CALCIUM 10.4   Liver Function Tests:  Recent Labs  03/07/13 1008  AST 50*  ALT 48  ALKPHOS 170*  BILITOT 0.3  PROT 8.3  ALBUMIN 3.3*    Recent Labs  03/07/13 1008  LIPASE 23   CBC:  Recent Labs  03/07/13 1008  WBC 7.1  NEUTROABS 4.4  HGB 13.9  HCT 40.3  MCV 84.7  PLT 111*   Coagulation:  Recent Labs  03/07/13 1008  LABPROT 13.6  INR 1.05   Urine Drug Screen: Drugs of Abuse     Component Value Date/Time   LABOPIA NONE DETECTED 02/02/2013 1545   LABOPIA  Value: POSITIVE (NOTE) Result repeated and verified. Sent for confirmatory testing* 06/05/2010 0629   COCAINSCRNUR NONE DETECTED 02/02/2013 1545   COCAINSCRNUR NEGATIVE 06/05/2010 0629   LABBENZ NONE DETECTED 02/02/2013 1545   LABBENZ  Value: POSITIVE (NOTE) Result repeated and verified. Sent for confirmatory testing* 06/05/2010 0629   AMPHETMU NONE DETECTED 02/02/2013 1545   AMPHETMU NEGATIVE 06/05/2010 0629   THCU NONE DETECTED 02/02/2013 1545   LABBARB NONE DETECTED 02/02/2013 1545     Imaging results:  Dg Abd Acute W/chest  03/07/2013   *RADIOLOGY REPORT*  Clinical Data: GI bleeding  ACUTE ABDOMEN SERIES (ABDOMEN 2 VIEW & CHEST 1 VIEW)  Comparison: Prior chest x-ray 02/02/2013; abdominal ultrasound 02/04/2013; prior CT abdomen and pelvis 12/07/2011  Findings: The lungs are clear.  No pulmonary edema, focal consolidation, pleural effusion or pneumothorax.  Stable cardiac and mediastinal contours which within normal limits.  Mild central bronchitic changes. Stable calcification versus other density in the mid epigastric region.  On prior CT, this is on the undersurface of the anterior abdominal wall.  Nonspecific bowel gas pattern.  Gas and stool noted throughout the colon to the level of the rectum without evidence of obstruction.  Prominence of the hepatic and splenic shadows.  No definite organic calcification. 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  No free  air.  IMPRESSION:  1.  No  acute cardiopulmonary disease. 2.  Nonobstructed bowel gas pattern. 3.  Prominent hepatic and splenic shadows.  Query hepatic splenomegaly.   Original Report Authenticated By: Malachy Moan, M.D.    Assessment & Plan by Problem:  Upper GI bleed - this is most likely reflects upper GI bleeding from the patient's known esophageal varices. GI has been consulted and it appears endoscopy will be pursued at this time. Agree with GI that patient would likely benefit from discontinuing Relafen and other NSAIDs going forward given his history of GI bleed.  - Admit to step down - Monitor vitals - Octreotide IV - Protonix IV - Endoscopy per GI  Liver cirrhosis -  Secondary to previous EtOH abuse, Hep B, and Hep C.  Pt follows with Dr. Dulce Sellar of [facility]. Albumin = 3.3. INR = 1.05.  Dispo: Disposition is deferred at this time, awaiting improvement of current medical problems. Anticipated discharge in approximately 2-3 day(s).   The patient does have a current PCP Andrey Campanile Elizebeth Koller, MD) and does not need an [facility] hospital follow-up appointment after discharge.  The patient does not have transportation limitations that hinder transportation to clinic appointments.  Signed: Elfredia Nevins, MD 03/07/2013, 1:02 PM

## 2013-03-07 NOTE — ED Notes (Signed)
Patient transported to X-ray 

## 2013-03-07 NOTE — ED Notes (Signed)
Per [facility] pt woke up throwing up bright red blood. C/o epigastric pain/nausea. Pt stated this is normal "with these episodes". 4mg  zofran en route. Hx of esophageal varices.  Seen ED X 2 wks ago for same. Per pt woke up with abd pain/nausea app 8am. Emesis X 3 w/ bright red blood. Pt states he is also coughing up blood. Nausea decreased w/ zofran given en route. Epigastric pain 8/10.

## 2013-03-07 NOTE — Progress Notes (Signed)
Interval progress note:    [facility] was contacted by nursing and informed that the patient began requesting dilaudid very soon after he reached the 2600 unit. He has morphine ordered PRN, however he continued requesting dilaudid as he stated morphine made him itch. It was explained to the patient through nursing that itching is a common side effect of opioids and that this effect is typical of the entire class and would not constitute a reason to escalate therapy to dilaudid, particularly as this appears to be an a side effect rather than an allergy (which would preclude use of the entire opioid class of medications). Despite his complaints, when informed of this, pt requested to be given morphine, however nursing informs that after he received 2mg  IV he stated that it had no effect and began requested more morphine or supplementation with other opioids. As he has been noted to be very comfortable in bed and is also turning off his bed alarm so that he can ambulate the room without restrictions as ordered on admission, it seems unlikely that he is having pain of a debilitating nature requiring further IV opioids prior to his next scheduled PRN dose in ~1 hour (morphine currently ordered at 2mg  IV q3h PRN). We appreciate nursing's assistance in helping manage this patient's complaints, and will continue to monitor.

## 2013-03-07 NOTE — Progress Notes (Signed)
Received report from Desirae, RN (ED RN).  States that pt is in Endo at this time for EGD and will be transferred to 2608  post procedure.  Salomon Mast, RN

## 2013-03-07 NOTE — Op Note (Signed)
[facility] [address] [place], [address]   ENDOSCOPY PROCEDURE REPORT  PATIENT: Mark Christensen, Mark Christensen  MR#: [id number] BIRTHDATE: 12/24/1960 , 51  yrs. old GENDER: Male  ENDOSCOPIST: Vida Rigger, MD REFERRED BY:  PROCEDURE DATE:  03/07/2013 PROCEDURE:   EGD w/ band ligation of varices ASA CLASS:   Class III INDICATIONS:Hematemesis.  in patient with history of cirrhosis varices and recent nonsteroidal use  MEDICATIONS: Fentanyl 75 mcg IV and Versed 6 mg IV   Benadryl 25 mg  TOPICAL ANESTHETIC:used  DESCRIPTION OF PROCEDURE:   After the risks benefits and alternatives of the procedure were thoroughly explained, informed consent was obtained.  The Pentax Gastroscope [id number]  endoscope was introduced through the mouth and advanced to the second portion of the duodenum , limited by Without limitations.   The instrument was slowly withdrawn as the mucosa was fully examined.there was a small clot in the esophagus and minimal old blood in the stomach but no signs of active bleeding and the findings are recorded below and we elected to put 2 bands in the distal esophagus but could not find any additional varices that we could then and we did have one misfire and the patient tolerated the procedure well there was no obvious immediate complication         FINDINGS:1. Few 1-2+ distal varices status post 2 bands placed at the end of the procedure2small clot in distal esophagus questionably from recent band falling off 3. Significant portal gastropathy 4. Small antral nodule not biopsied 4. Otherwise within normal limits EGD  COMPLICATIONS:none  ENDOSCOPIC IMPRESSION:above   RECOMMENDATIONS:clear liquid diet no nonsteroidals long-term make sure he is on pump inhibitors and he should be on Inderal to lower portal pressures and consider Carafate use in the future as well as possibly even a TIPS   REPEAT EXAM: as  needed   _______________________________ Vida Rigger, MD eSigned:  Vida Rigger, MD 03/07/2013 2:48 PM    CC:  PATIENT NAME:  Lott, Seelbach MR#: [id number]

## 2013-03-07 NOTE — ED Provider Notes (Signed)
History     CSN: [id number]  Arrival date & time 03/07/13  4098   First MD Initiated Contact with Patient 03/07/13 [phone]      Chief Complaint  Patient presents with  . GI Bleeding    (Consider location/radiation/quality/duration/timing/severity/associated sxs/prior treatment) HPI Patient presents after 3 episodes of nausea, without vomiting bright red blood. He states that prior to this, since a recent esophageal banding procedure he has been generally well. He does state that over the past day he has had mild diffuse abdominal pain with no diarrhea, no melena, no bright red blood per rectum. He denies new lightheadedness, syncope, chest pain, dyspnea, confusion, disorientation, fever, chills. He states that he is compliant with his medication. He acknowledges a history of hepatitis B, hepatitis C, cirrhosis.  Past Medical History  Diagnosis Date  . Hepatic cirrhosis   . Hypertension     NO LONGER TAKING B/P MEDS--STATES DISCONTINUED BY HIS DOCTOR  . Anxiety   . Shortness of breath   . GERD (gastroesophageal reflux disease)   . Restless leg syndrome   . Complication of anesthesia     ENDO/COLONOSCOPY ATTEMPTED IN THE PAST-UNABLE TO ADEQUATELY SEDATE PT-TOLD WOULD NEED ANESTHESIA FOR FUTURE PROCEDURES  . Pneumonia 09-2012  . Headache(784.0)     HX OF MIGRAINES  . Arthritis     hands  . Hepatitis B 01-30-13    liver enzymes remains elevated  . Hepatitis C   . Wound healing, delayed 01-30-13    3-22-'14 s/p I&D right ankle surgery(infection) -minimal drainage with dressing    Past Surgical History  Procedure Laterality Date  . Appendectomy    . Esophageal banding      FOR TX OF GI BLEED ABOUT 2008  . Arm debridement  08-11-12    right forearm wound with debridement 2 months ago/ right abdomen also-healing well  . Multiple tooth extractions  08/12/2012  . Colonoscopy with propofol  08/13/2012    Procedure: COLONOSCOPY WITH PROPOFOL;  Surgeon: Willis Modena, MD;   Location: [facility] ENDOSCOPY;  Service: Endoscopy;  Laterality: N/A;  . 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 Esophagogastroduodenoscopy N/A 11/18/2012    Procedure: ESOPHAGOGASTRODUODENOSCOPY (EGD);  Surgeon: Shirley Friar, MD;  Location: [facility] ENDOSCOPY;  Service: Endoscopy;  Laterality: N/A;  . Esophageal banding  11/18/2012    Procedure: ESOPHAGEAL BANDING;  Surgeon: Shirley Friar, MD;  Location: [facility] ENDOSCOPY;  Service: Endoscopy;;  . I&d extremity Right 12/13/2012    Procedure: IRRIGATION AND DEBRIDEMENT EXTREMITY;  Surgeon: Shelda Pal, MD;  Location[facility] OR;  Service: Orthopedics;  Laterality: Right;  . Ankle surgery      Debridement of ankle 12-13-12-[place]/ Dr. Ashley Royalty  . Peripherally inserted central catheter insertion      insertion and removal(IVR)  . Esophagogastroduodenoscopy N/A 02/03/2013    Procedure: ESOPHAGOGASTRODUODENOSCOPY (EGD);  Surgeon: Shirley Friar, MD;  Location: [facility] ENDOSCOPY;  Service: Endoscopy;  Laterality: N/A;    No family history on file.  History  Substance Use Topics  . Smoking status: Current Every Day Smoker -- 0.50 packs/day for 30 years    Types: Cigarettes  . Smokeless tobacco: Never Used  . Alcohol Use: No     Comment: RARELY USES ALCOHOL      Review of Systems  Constitutional:       Per HPI, otherwise negative  HENT:       Per HPI, otherwise negative  Respiratory:  Per HPI, otherwise negative  Cardiovascular:       Per HPI, otherwise negative  Gastrointestinal: Positive for nausea, vomiting and abdominal pain. Negative for diarrhea, constipation, blood in stool, abdominal distention, anal bleeding and rectal pain.  Endocrine:       Negative aside from HPI  Genitourinary:       Neg aside from HPI   Musculoskeletal:       Per HPI, otherwise negative  Skin: Negative.   Neurological: Negative for syncope.    Allergies  Tylenol; Asa; Morphine and related; and Tramadol  Home  Medications   Current Outpatient Rx  Name  Route  Sig  Dispense  Refill  . albuterol (PROVENTIL HFA;VENTOLIN HFA) 108 (90 BASE) MCG/ACT inhaler   Inhalation   Inhale 1-2 puffs into the lungs every 4 (four) hours as needed for wheezing. For wheezing         . amitriptyline (ELAVIL) 25 MG tablet   Oral   Take 25 mg by mouth at bedtime.         . ciprofloxacin (CIPRO) 500 MG tablet   Oral   Take 1 tablet (500 mg total) by mouth 2 (two) times daily.   16 tablet   0   . docusate sodium (COLACE) 100 MG capsule   Oral   Take 100 mg by mouth 2 (two) times daily as needed for constipation. For constipation         . FLUoxetine (PROZAC) 40 MG capsule   Oral   Take 40 mg by mouth daily.         . nabumetone (RELAFEN) 500 MG tablet   Oral   Take 500 mg by mouth 2 (two) times daily.         Marland Kitchen oxyCODONE (OXY IR/ROXICODONE) 5 MG immediate release tablet   Oral   Take 1 tablet (5 mg total) by mouth every 4 (four) hours as needed for pain. For pain   30 tablet   0   . oxyCODONE (ROXICODONE) 5 MG immediate release tablet   Oral   Take 1 tablet (5 mg total) by mouth every 4 (four) hours as needed for pain.   15 tablet   0   . pantoprazole (PROTONIX) 40 MG tablet   Oral   Take 1 tablet (40 mg total) by mouth daily.   30 tablet   11   . polyethylene glycol (MIRALAX / GLYCOLAX) packet   Oral   Take 17 g by mouth daily as needed. For constipation         . EXPIRED: promethazine (PHENERGAN) 25 MG tablet   Oral   Take 1 tablet (25 mg total) by mouth every 6 (six) hours as needed for nausea.   20 tablet   0   . promethazine (PHENERGAN) 25 MG tablet   Oral   Take 25 mg by mouth every 6 (six) hours as needed for nausea.         Marland Kitchen sulfamethoxazole-trimethoprim (SEPTRA DS) 800-160 MG per tablet   Oral   Take 1 tablet by mouth every 12 (twelve) hours.   20 tablet   0     BP 170/108  Pulse 88  Temp(Src) 98.5 F (36.9 C) (Oral)  Resp 16  SpO2 98%  Physical  Exam  Nursing note and vitals reviewed. Constitutional: He is oriented to person, place, and time. He appears well-developed. No distress.  HENT:  Head: Normocephalic and atraumatic.  Eyes: Conjunctivae and EOM are normal.  Cardiovascular:  Normal rate and regular rhythm.   Pulmonary/Chest: Effort normal. No stridor. No respiratory distress.  Abdominal: Soft. Normal appearance and bowel sounds are normal. He exhibits no distension. There is no hepatosplenomegaly. There is tenderness in the epigastric area. There is no rigidity, no rebound and no guarding.  Musculoskeletal: He exhibits no edema.  Neurological: He is alert and oriented to person, place, and time.  Skin: Skin is warm and dry.  Psychiatric: He has a normal mood and affect.    ED Course  Procedures (including critical care time)  Labs Reviewed  CBC  CBC WITH DIFFERENTIAL  COMPREHENSIVE METABOLIC PANEL  LACTIC ACID, PLASMA  LIPASE, BLOOD  PROTIME-INR  TYPE AND SCREEN   No results found.   No diagnosis found.  Pulse ox 99% supplemental oxygen abnormal Cardiac 95 sinus rhythm normal  11:54 AM Patient with bright red blood per rectum  Update: I discussed the patient's case with our gastroenterologist. I also discussed the case with the admitting team.  Update: On repeat exam the patient appears medically stable.  No additional episodes of emesis or melena MDM  This patient with a history of hepatitis B, hepatitis C, recent esophageal banding procedure now presents without her several episodes of hematemesis.  The patient is medically stable, hemoglobin is stable, but he has evidence of ongoing bleed.  The patient received IV fluids, octreotide, pantoprazole, and was admitted for further E/M after I discussed his case with GI and the admitting team.        Gerhard Munch, MD 03/07/13 1539

## 2013-03-07 NOTE — Progress Notes (Signed)
Pt c/o abdominal pain 8/10, Morphine ordered, in to administer Morphine and patient states that he is allergic to Morphine, states "they usually give me Dilaudid, Morphine makes me itch." Called Dr. Lavena Bullion, he states to give Morphine, and that it is okay to administer medication despite documented allergy.

## 2013-03-07 NOTE — OR Nursing (Signed)
Report called to Morrie Sheldon and patient transported to 2608 via stretcher.

## 2013-03-08 LAB — COMPREHENSIVE METABOLIC PANEL
AST: 36 U/L (ref 0–37)
Albumin: 3 g/dL — ABNORMAL LOW (ref 3.5–5.2)
Alkaline Phosphatase: 133 U/L — ABNORMAL HIGH (ref 39–117)
Chloride: 101 mEq/L (ref 96–112)
Potassium: 4.1 mEq/L (ref 3.5–5.1)
Sodium: 135 mEq/L (ref 135–145)
Total Bilirubin: 0.4 mg/dL (ref 0.3–1.2)
Total Protein: 7.4 g/dL (ref 6.0–8.3)

## 2013-03-08 LAB — CBC WITH DIFFERENTIAL/PLATELET
Basophils Absolute: 0.1 K/uL (ref 0.0–0.1)
Basophils Relative: 1 % (ref 0–1)
Eosinophils Absolute: 0.2 K/uL (ref 0.0–0.7)
Eosinophils Relative: 2 % (ref 0–5)
HCT: 40.3 % (ref 39.0–52.0)
Hemoglobin: 13.9 g/dL (ref 13.0–17.0)
Lymphocytes Relative: 25 % (ref 12–46)
Lymphs Abs: 1.8 K/uL (ref 0.7–4.0)
MCH: 29.2 pg (ref 26.0–34.0)
MCHC: 34.5 g/dL (ref 30.0–36.0)
MCV: 84.7 fL (ref 78.0–100.0)
Monocytes Absolute: 0.7 K/uL (ref 0.1–1.0)
Monocytes Relative: 9 % (ref 3–12)
Neutro Abs: 4.4 K/uL (ref 1.7–7.7)
Neutrophils Relative %: 62 % (ref 43–77)
Platelets: 111 K/uL — ABNORMAL LOW (ref 150–400)
RBC: 4.76 MIL/uL (ref 4.22–5.81)
RDW: 16.1 % — ABNORMAL HIGH (ref 11.5–15.5)
WBC: 7.1 K/uL (ref 4.0–10.5)

## 2013-03-08 LAB — CBC
Platelets: 90 10*3/uL — ABNORMAL LOW (ref 150–400)
RDW: 16.1 % — ABNORMAL HIGH (ref 11.5–15.5)
WBC: 4.5 10*3/uL (ref 4.0–10.5)

## 2013-03-08 MED ORDER — OXYCODONE HCL 5 MG PO TABS
5.0000 mg | ORAL_TABLET | ORAL | Status: DC | PRN
  Administered 2013-03-08 – 2013-03-09 (×5): 5 mg via ORAL
  Filled 2013-03-08 (×5): qty 1

## 2013-03-08 MED ORDER — OXYCODONE HCL 5 MG PO TABS
5.0000 mg | ORAL_TABLET | Freq: Once | ORAL | Status: AC
  Administered 2013-03-08: 5 mg via ORAL
  Filled 2013-03-08: qty 1

## 2013-03-08 MED ORDER — OXYCODONE HCL 5 MG PO TABS: 5.0000 mg | ORAL_TABLET | Freq: Once | ORAL | Status: DC

## 2013-03-08 MED ORDER — PANTOPRAZOLE SODIUM 40 MG PO TBEC
40.0000 mg | DELAYED_RELEASE_TABLET | Freq: Two times a day (BID) | ORAL | Status: DC
  Administered 2013-03-08 – 2013-03-09 (×2): 40 mg via ORAL
  Filled 2013-03-08 (×2): qty 1

## 2013-03-08 MED ORDER — PROPRANOLOL HCL 10 MG PO TABS
10.0000 mg | ORAL_TABLET | Freq: Three times a day (TID) | ORAL | Status: DC
  Administered 2013-03-08 – 2013-03-09 (×4): 10 mg via ORAL
  Filled 2013-03-08 (×6): qty 1

## 2013-03-08 NOTE — Progress Notes (Signed)
Subjective:    Patient denies abdominal pain this AM. Denies any further hematemesis or N/V in general.   Interval Events: Pt underwent endoscopy yesterday which revealed 1-2+ distal varices. Two bands were placed during the procedure.    Objective:    Vital Signs:   Temp:  [97.4 F (36.3 C)-98.2 F (36.8 C)] 97.7 F (36.5 C) (06/15 0815) Pulse Rate:  [73-89] 77 (06/15 0815) Resp:  [9-21] 21 (06/15 0900) BP: (121-189)/(66-120) 179/93 mmHg (06/15 0900) SpO2:  [92 %-100 %] 100 % (06/15 0900) Weight:  [194 lb 0.1 oz (88 kg)-205 lb (92.987 kg)] 194 lb 0.1 oz (88 kg) (06/14 1530) Last BM Date: 03/06/13  24-hour weight change: Weight change:   Intake/Output:   Intake/Output Summary (Last 24 hours) at 03/08/13 1040 Last data filed at 03/08/13 0900  Gross per 24 hour  Intake   2200 ml  Output   1650 ml  Net    550 ml      Physical Exam: General: Vital signs reviewed and noted. Somnolent.   Lungs:  Normal respiratory effort. Clear to auscultation BL without crackles or wheezes.  Heart: RRR. S1 and S2 normal without gallop, murmur, or rubs.  Abdomen:  BS normoactive. Soft, Nondistended, non-tender.  No masses or organomegaly.  Extremities: No pretibial edema.     Labs:  Basic Metabolic Panel:  Recent Labs Lab 03/07/13 1008 03/08/13 0615  NA 137 135  K 4.4 4.1  CL 102 101  CO2 25 26  GLUCOSE 104* 115*  BUN 21 20  CREATININE 0.82 0.93  CALCIUM 10.4 8.8    Liver Function Tests:  Recent Labs Lab 03/07/13 1008 03/08/13 0615  AST 50* 36  ALT 48 37  ALKPHOS 170* 133*  BILITOT 0.3 0.4  PROT 8.3 7.4  ALBUMIN 3.3* 3.0*    Recent Labs Lab 03/07/13 1008  LIPASE 23   CBC:  Recent Labs Lab 03/07/13 1008 03/08/13 0615  WBC 7.1 4.5  NEUTROABS 4.4  --   HGB 13.9 13.1  HCT 40.3 38.3*  MCV 84.7 85.3  PLT 111* 90*    Cardiac Enzymes:  Recent Labs Lab 03/07/13 1530  TROPONINI <0.30    Microbiology: Results for orders placed during the  hospital encounter of 03/07/13  MRSA PCR SCREENING     Status: None   Collection Time    03/07/13  3:49 PM      Result Value Range Status   MRSA by PCR NEGATIVE  NEGATIVE Final   Comment:            The GeneXpert MRSA Assay (FDA     approved for NASAL specimens     only), is one component of a     comprehensive MRSA colonization     surveillance program. It is not     intended to diagnose MRSA     infection nor to guide or     monitor treatment for     MRSA infections.    Coagulation Studies:  Recent Labs  03/07/13 1008  LABPROT 13.6  INR 1.05    Imaging: Dg Abd Acute W/chest  03/07/2013   *RADIOLOGY REPORT*  Clinical Data: GI bleeding  ACUTE ABDOMEN SERIES (ABDOMEN 2 VIEW & CHEST 1 VIEW)  Comparison: Prior chest x-ray 02/02/2013; abdominal ultrasound 02/04/2013; prior CT abdomen and pelvis 12/07/2011  Findings: The lungs are clear.  No pulmonary edema, focal consolidation, pleural effusion or pneumothorax.  Stable cardiac and mediastinal contours which within normal limits.  Mild  central bronchitic changes. Stable calcification versus other density in the mid epigastric region.  On prior CT, this is on the undersurface of the anterior abdominal wall.  Nonspecific bowel gas pattern.  Gas and stool noted throughout the colon to the level of the rectum without evidence of obstruction.  Prominence of the hepatic and splenic shadows.  No definite organic calcification. 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  No free air.  IMPRESSION:  1.  No acute cardiopulmonary disease. 2.  Nonobstructed bowel gas pattern. 3.  Prominent hepatic and splenic shadows.  Query hepatic splenomegaly.   Original Report Authenticated By: Malachy Moan, M.D.       Medications:    Infusions: . sodium chloride 75 mL/hr at 03/07/13 1705  . octreotide (SANDOSTATIN) infusion 50 mcg/hr (03/08/13 0940)  . pantoprozole (PROTONIX) infusion 8 mg/hr (03/08/13 0940)    Scheduled  Medications: . cefTRIAXone (ROCEPHIN)  IV  1 g Intravenous Q24H  . propranolol  10 mg Oral TID  . sodium chloride  3 mL Intravenous Q12H    PRN Medications: albuterol, ondansetron (ZOFRAN) IV, ondansetron, oxyCODONE   Assessment/ Plan:   Esophageal varices - s/p endoscopy with 2 bands placed for distal 1-2+ varices. No active bleeding noted. Hb stable at 13.1 this AM (13.9 on admission). - will wean off octreotide today - transition to PO protonix 40mg  bid - propranolol 10mg  tid - cont rocephin for SBP ppx  Liver cirrhosis - Secondary to previous EtOH abuse, Hep B, and Hep C. Pt follows with Dr. Dulce Sellar of [facility]. Albumin = 3.3. INR = 1.05.    DVT PPX - SCDs  CODE STATUS - full  CONSULTS PLACED - GI  DISPO - Disposition is deferred at this time, awaiting improvement of current medical problems.   Anticipated discharge in approximately 1-2 day(s).   The patient does have a current PCP Kaleen Mask, MD) and does not need an [facility] hospital follow-up appointment after discharge.    Is the [facility] hospital follow-up appointment a one-time only appointment? not applicable.  Does the patient have transportation limitations that hinder transportation to clinic appointments? unknown   SERVICE NEEDED AT DISCHARGE - TO BE DETERMINED DURING HOSPITAL COURSE         Y = Yes, Blank = No PT:   OT:   RN:   Equipment:   Other:      Length of Stay: 1 day(s)   Signed: Elfredia Nevins, MD  PGY-1, Internal Medicine Resident Pager: [phone] (7AM-5PM) 03/08/2013, 10:40 AM

## 2013-03-08 NOTE — H&P (Signed)
Date: 03/08/2013  Patient name: Mark Christensen  Medical record number: [id number]  Date of birth: January 14, 1961   I have seen and evaluated Mark Christensen and discussed their care with the Residency Team. Mark Christensen presented with 3 episodes of hematemesis on the AM of admission. He denied any prior episodes or any melena. He has no new ABD pain other than his chronic RUQ pain. He has no F/C/S. He was placed on Relafen by his ortho and spironolactone was started about 2 weeks ago by Dr Mark Christensen.   He had an admission in May 2014 for the same and had an EGD with eso varices banding. He was sent home on PPI indefinitely and 10 day course of Cipro for SBP prophylaxis. Of note, he is also taking bactrim, he states was Rx'd by Dr Mark Christensen who knew he was also on Cipro. The Bactrim was for SBP prophylaxis but was temp for a non healing wound on ankle.  Today's denies pain. Was able to take liquids without issue. Is tired. Denies further hematemesis or melena.  Physical Exam: Blood pressure 112/56, pulse 70, temperature 97.8 F (36.6 C), temperature source Oral, resp. rate 8, height 6\' 4"  (1.93 m), weight 194 lb 0.1 oz (88 kg), SpO2 100.00%. General appearance: alert, cooperative, appears stated age and no distress Head: Normocephalic, without obvious abnormality, atraumatic Eyes: No sclera iscterus. EOMI Lungs: clear to auscultation bilaterally Heart: regular rate and rhythm, S1, S2 normal, no murmur, click, rub or gallop Abdomen: + BS, minimal RUQ tenderness. Subjective tenderness lower quadrants Extremities: Warm, + PT pulses B, old scars R elbow area and R ankle area, no edema Pulses: 2+ and symmetric Skin: Skin color, texture, turgor normal. No rashes or lesions or see above  Lab results: Results for orders placed during the hospital encounter of 03/07/13 (from the past 24 hour(s))  TROPONIN I     Status: None   Collection Time    03/07/13  3:30 PM      Result Value Range   Troponin I <0.30   <0.30 ng/mL  MRSA PCR SCREENING     Status: None   Collection Time    03/07/13  3:49 PM      Result Value Range   MRSA by PCR NEGATIVE  NEGATIVE  CBC     Status: Abnormal   Collection Time    03/08/13  6:15 AM      Result Value Range   WBC 4.5  4.0 - 10.5 K/uL   RBC 4.49  4.22 - 5.81 MIL/uL   Hemoglobin 13.1  13.0 - 17.0 g/dL   HCT 40.9 (*) 81.1 - 91.4 %   MCV 85.3  78.0 - 100.0 fL   MCH 29.2  26.0 - 34.0 pg   MCHC 34.2  30.0 - 36.0 g/dL   RDW 78.2 (*) 95.6 - 21.3 %   Platelets 90 (*) 150 - 400 K/uL  COMPREHENSIVE METABOLIC PANEL     Status: Abnormal   Collection Time    03/08/13  6:15 AM      Result Value Range   Sodium 135  135 - 145 mEq/L   Potassium 4.1  3.5 - 5.1 mEq/L   Chloride 101  96 - 112 mEq/L   CO2 26  19 - 32 mEq/L   Glucose, Bld 115 (*) 70 - 99 mg/dL   BUN 20  6 - 23 mg/dL   Creatinine, Ser 0.86  0.50 - 1.35 mg/dL   Calcium 8.8  8.4 -  10.5 mg/dL   Total Protein 7.4  6.0 - 8.3 g/dL   Albumin 3.0 (*) 3.5 - 5.2 g/dL   AST 36  0 - 37 U/L   ALT 37  0 - 53 U/L   Alkaline Phosphatase 133 (*) 39 - 117 U/L   Total Bilirubin 0.4  0.3 - 1.2 mg/dL   GFR calc non Af Amer >90  >90 mL/min   GFR calc Af Amer >90  >90 mL/min    Imaging results:  Dg Abd Acute W/chest  03/07/2013   *RADIOLOGY REPORT*  Clinical Data: GI bleeding  ACUTE ABDOMEN SERIES (ABDOMEN 2 VIEW & CHEST 1 VIEW)  Comparison: Prior chest x-ray 02/02/2013; abdominal ultrasound 02/04/2013; prior CT abdomen and pelvis 12/07/2011  Findings: The lungs are clear.  No pulmonary edema, focal consolidation, pleural effusion or pneumothorax.  Stable cardiac and mediastinal contours which within normal limits.  Mild central bronchitic changes. Stable calcification versus other density in the mid epigastric region.  On prior CT, this is on the undersurface of the anterior abdominal wall.  Nonspecific bowel gas pattern.  Gas and stool noted throughout the colon to the level of the rectum without evidence of obstruction.   Prominence of the hepatic and splenic shadows.  No definite organic calcification. 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  No free air.  IMPRESSION:  1.  No acute cardiopulmonary disease. 2.  Nonobstructed bowel gas pattern. 3.  Prominent hepatic and splenic shadows.  Query hepatic splenomegaly.   Original Report Authenticated By: Malachy Moan, M.D.    Assessment and Plan: I have seen and evaluated the patient as outlined above. I agree with the formulated Assessment and Plan as detailed in the residents' admission note, with the following changes:   1. UGI bleed - s/p EGD without sources of active bleeding. Two varices were banded. There was a small clot in distal eso where a band might have come off. HgB stable. Transition PPI to PO and octreotide to off. Start BB for 2ndary prophylaxis. No NSAIDs.   2. Cirrhosis - the pt is on Rocephin for SBP prophylaxis. He has never had a liver bx. Sees Mark Christensen in outpt setting.  Likely D/C in AM.  Burns Spain, MD 6/15/201411:40 AM

## 2013-03-08 NOTE — Progress Notes (Signed)
Mark Christensen 9:15 AM  Subjective: Patient without signs of bleeding and no obvious post endoscopic complications and notes were reviewed unsure why patient would need morphine or Dilaudid in the first place  Objective: Vital signs stable afebrile no acute distress abdomen is soft nontender labs stable  Assessment: Cirrhosis upper GI bleeding status post banding  Plan: Probably could have soft solids today or full liquids and could slowly wean octreotide and would start beta blockers and no nonsteroidals and continue pump inhibitors and followup in a few weeks with my partner Dr. Deborra Medina E

## 2013-03-09 ENCOUNTER — Inpatient Hospital Stay (HOSPITAL_COMMUNITY)
Admission: EM | Admit: 2013-03-09 | Discharge: 2013-03-11 | DRG: 433 | Disposition: A | Discharge status: Home / Self Care | Payer: Medicaid Other | Attending: Internal Medicine | Admitting: Internal Medicine

## 2013-03-09 ENCOUNTER — Encounter (HOSPITAL_COMMUNITY): Payer: Self-pay | Admitting: Gastroenterology

## 2013-03-09 DIAGNOSIS — K7030 Alcoholic cirrhosis of liver without ascites: Principal | ICD-10-CM | POA: Diagnosis present

## 2013-03-09 DIAGNOSIS — R109 Unspecified abdominal pain: Secondary | ICD-10-CM

## 2013-03-09 DIAGNOSIS — F411 Generalized anxiety disorder: Secondary | ICD-10-CM | POA: Diagnosis present

## 2013-03-09 DIAGNOSIS — G2581 Restless legs syndrome: Secondary | ICD-10-CM | POA: Diagnosis present

## 2013-03-09 DIAGNOSIS — R55 Syncope and collapse: Secondary | ICD-10-CM | POA: Diagnosis present

## 2013-03-09 DIAGNOSIS — R7402 Elevation of levels of lactic acid dehydrogenase (LDH): Secondary | ICD-10-CM | POA: Diagnosis present

## 2013-03-09 DIAGNOSIS — K7460 Unspecified cirrhosis of liver: Secondary | ICD-10-CM | POA: Diagnosis present

## 2013-03-09 DIAGNOSIS — I8510 Secondary esophageal varices without bleeding: Secondary | ICD-10-CM | POA: Diagnosis present

## 2013-03-09 DIAGNOSIS — K766 Portal hypertension: Secondary | ICD-10-CM | POA: Diagnosis present

## 2013-03-09 DIAGNOSIS — K319 Disease of stomach and duodenum, unspecified: Secondary | ICD-10-CM | POA: Diagnosis present

## 2013-03-09 DIAGNOSIS — R7401 Elevation of levels of liver transaminase levels: Secondary | ICD-10-CM | POA: Diagnosis present

## 2013-03-09 DIAGNOSIS — I10 Essential (primary) hypertension: Secondary | ICD-10-CM | POA: Diagnosis present

## 2013-03-09 DIAGNOSIS — K219 Gastro-esophageal reflux disease without esophagitis: Secondary | ICD-10-CM | POA: Diagnosis present

## 2013-03-09 DIAGNOSIS — IMO0002 Reserved for concepts with insufficient information to code with codable children: Secondary | ICD-10-CM

## 2013-03-09 DIAGNOSIS — R1110 Vomiting, unspecified: Secondary | ICD-10-CM | POA: Diagnosis present

## 2013-03-09 DIAGNOSIS — B1910 Unspecified viral hepatitis B without hepatic coma: Secondary | ICD-10-CM | POA: Diagnosis present

## 2013-03-09 DIAGNOSIS — B1920 Unspecified viral hepatitis C without hepatic coma: Secondary | ICD-10-CM | POA: Diagnosis present

## 2013-03-09 DIAGNOSIS — F17200 Nicotine dependence, unspecified, uncomplicated: Secondary | ICD-10-CM | POA: Diagnosis present

## 2013-03-09 DIAGNOSIS — E860 Dehydration: Secondary | ICD-10-CM | POA: Diagnosis present

## 2013-03-09 DIAGNOSIS — D696 Thrombocytopenia, unspecified: Secondary | ICD-10-CM | POA: Diagnosis present

## 2013-03-09 HISTORY — DX: Avulsion of right eye, initial encounter: S05.71XA

## 2013-03-09 HISTORY — DX: Syncope and collapse: R55

## 2013-03-09 LAB — URINALYSIS, ROUTINE W REFLEX MICROSCOPIC
Ketones, ur: NEGATIVE mg/dL
Leukocytes, UA: NEGATIVE
Nitrite: NEGATIVE
Protein, ur: NEGATIVE mg/dL
pH: 6 (ref 5.0–8.0)

## 2013-03-09 LAB — COMPREHENSIVE METABOLIC PANEL
Alkaline Phosphatase: 128 U/L — ABNORMAL HIGH (ref 39–117)
BUN: 13 mg/dL (ref 6–23)
CO2: 25 mEq/L (ref 19–32)
Calcium: 9 mg/dL (ref 8.4–10.5)
GFR calc Af Amer: >90 mL/min (ref >90)
GFR calc non Af Amer: >90 mL/min (ref >90)
Glucose, Bld: 118 mg/dL — ABNORMAL HIGH (ref 70–99)
Potassium: 3.8 mEq/L (ref 3.5–5.1)
Total Protein: 7.4 g/dL (ref 6.0–8.3)

## 2013-03-09 LAB — CBC
HCT: 39.8 % (ref 39.0–52.0)
Hemoglobin: 13.7 g/dL (ref 13.0–17.0)
MCHC: 34.4 g/dL (ref 30.0–36.0)
Platelets: 83 10*3/uL — ABNORMAL LOW (ref 150–400)
WBC: 5.1 10*3/uL (ref 4.0–10.5)

## 2013-03-09 MED ORDER — LACTATED RINGERS IV BOLUS (SEPSIS): 500.0000 mL | Freq: Once | INTRAVENOUS | Status: DC

## 2013-03-09 MED ORDER — SODIUM CHLORIDE 0.9 % IV BOLUS (SEPSIS)
500.0000 mL | Freq: Once | INTRAVENOUS | Status: AC
  Administered 2013-03-09: 500 mL via INTRAVENOUS

## 2013-03-09 MED ORDER — HYDROMORPHONE HCL PF 1 MG/ML IJ SOLN
0.5000 mg | Freq: Once | INTRAMUSCULAR | Status: AC
  Administered 2013-03-09: 0.5 mg via INTRAVENOUS
  Filled 2013-03-09 (×2): qty 1

## 2013-03-09 MED ORDER — ONDANSETRON HCL 4 MG/2ML IJ SOLN
4.0000 mg | Freq: Once | INTRAMUSCULAR | Status: DC
  Filled 2013-03-09: qty 2

## 2013-03-09 MED ORDER — OXYCODONE HCL 5 MG PO TABS
5.0000 mg | ORAL_TABLET | ORAL | Status: DC | PRN
  Administered 2013-03-09: 10 mg via ORAL
  Filled 2013-03-09: qty 2

## 2013-03-09 MED ORDER — ONDANSETRON HCL 4 MG/2ML IJ SOLN
4.0000 mg | Freq: Once | INTRAMUSCULAR | Status: AC
  Administered 2013-03-09: 4 mg via INTRAVENOUS
  Filled 2013-03-09: qty 2

## 2013-03-09 NOTE — ED Notes (Signed)
The patient was throwing up blood on 6/14, so he came to [facility] for care.  He was diagnosed with an upper GI bleed and put up for admission, but he decided to sign out AMA.  Today, he was outside in the heat, and his family witnessed the patient have a one minute syncopal episode.  Prior to that, they advised EMS that he had been lethargic all day.  En route, he c/o nausea, so he received Zofran 4 mg, iv and NS 500 mL, iv.  He was CVA/TIA - & respiratory distress -.

## 2013-03-09 NOTE — Progress Notes (Addendum)
Patient ID: Mark Christensen, male   DOB: [birth year], 52 y.o.   MRN: [id number] [facility] Gastroenterology Progress Note  Mark Christensen 52 y.o. Mar 20, 1961   Subjective: Complaining of RUQ pain that he says has been present for years. States pain meds are not helping. Denies N/V. Denies BMs overnight.  Objective: Vital signs in last 24 hours: Filed Vitals:   03/09/13 0931  BP: 140/99  Pulse: 66  Temp:   Resp:     Physical Exam: Gen: alert, no acute distress, well-nourished, awake Abd: RUQ tenderness with guarding, soft, nondistended, +BS  Lab Results:  Recent Labs  03/08/13 0615 03/09/13 0428  NA 135 136  K 4.1 3.8  CL 101 101  CO2 26 25  GLUCOSE 115* 118*  BUN 20 13  CREATININE 0.93 0.96  CALCIUM 8.8 9.0    Recent Labs  03/08/13 0615 03/09/13 0428  AST 36 32  ALT 37 31  ALKPHOS 133* 128*  BILITOT 0.4 0.4  PROT 7.4 7.4  ALBUMIN 3.0* 3.0*    Recent Labs  03/07/13 1008 03/08/13 0615 03/09/13 0715  WBC 7.1 4.5 5.1  NEUTROABS 4.4  --   --   HGB 13.9 13.1 13.7  HCT 40.3 38.3* 39.8  MCV 84.7 85.3 85.2  PLT 111* 90* 83*    Recent Labs  03/07/13 1008  LABPROT 13.6  INR 1.05      Assessment/Plan: 52 yo with cirrhosis and s/p GI bleed with EGD over weekend without active bleeding seen. S/P banding done. 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 LFTs normal. Will make NPO. Will do abd U/S to look for cholecystitis. Due to his cirrhosis, gallbladder surgery will be at increased risk and no acute need for any surgery. No evidence of further bleeding. Continue Protonix. Propranolol and Abx. Supportive care. Pain meds per primary team.   Mark Christensen C. 03/09/2013, 11:08 AM

## 2013-03-09 NOTE — Progress Notes (Signed)
Pt opted to leave AMA. IV removed. AMA papers signed by Pt, and in chart.

## 2013-03-09 NOTE — Discharge Summary (Signed)
Mark Christensen left the hospital today AGAINST MEDICAL ADVICE  Name: Mark Christensen MRN: [id number] DOB: May 23, 1961 52 y.o. PCP: Mark Mask, MD  Date of Admission: 03/07/2013  9:23 AM Date of Discharge: 03/09/2013 Attending Physician: Dr. Dalphine Handing  Discharge Diagnosis: Principal Problem:   Esophageal varices in cirrhosis Active Problems:   Cirrhosis of liver   Hematemesis/vomiting blood   Hepatitis C   Hepatitis B   Abdominal pain   Hypertension  Discharge Medications: LEFT AGAINST MEDICAL ADVICE, below are the medications listed on home meds on epic, discharge medication reconciliation not done due to patient leaving AMA.     Medication List    ASK your doctor about these medications       albuterol 108 (90 BASE) MCG/ACT inhaler  Commonly known as:  PROVENTIL HFA;VENTOLIN HFA  Inhale 1-2 puffs into the lungs every 4 (four) hours as needed for wheezing. For wheezing     amitriptyline 25 MG tablet  Commonly known as:  ELAVIL  Take 25 mg by mouth at bedtime.     ciprofloxacin 500 MG tablet  Commonly known as:  CIPRO  Take 1 tablet (500 mg total) by mouth 2 (two) times daily.     docusate sodium 100 MG capsule  Commonly known as:  COLACE  Take 100 mg by mouth 2 (two) times daily as needed for constipation. For constipation     FLUoxetine 40 MG capsule  Commonly known as:  PROZAC  Take 40 mg by mouth daily.     nabumetone 500 MG tablet  Commonly known as:  RELAFEN  Take 500 mg by mouth 2 (two) times daily.     oxyCODONE 5 MG immediate release tablet  Commonly known as:  Oxy IR/ROXICODONE  Take 1 tablet (5 mg total) by mouth every 4 (four) hours as needed for pain. For pain     pantoprazole 40 MG tablet  Commonly known as:  PROTONIX  Take 1 tablet (40 mg total) by mouth daily.     polyethylene glycol packet  Commonly known as:  MIRALAX / GLYCOLAX  Take 17 g by mouth daily as needed. For constipation     promethazine 25 MG tablet  Commonly known as:   PHENERGAN  Take 25 mg by mouth every 6 (six) hours as needed for nausea.     sulfamethoxazole-trimethoprim 800-160 MG per tablet  Commonly known as:  SEPTRA DS  Take 1 tablet by mouth every 12 (twelve) hours.       Disposition and follow-up:   Mark Christensen LEFT THE HOSPITAL ON 03/09/13 AGAINST MEDICAL ADVICE.  At the hospital follow up visit please address:  1.  Esophageal Varices--s/p endoscopy 6/14 s/p 2 bands for 1-2+ distal varices  2.  Abdominal pain--on oxycodone 5mg  q4h prn at home, requests Dilaudid during hospital course.   3.  Pending labs/ test needing follow-up: recommended to follow up with Dr. Dulce Sellar in few weeks  Follow-up Appointments: Dr. Dulce Sellar, GI and PCP  Discharge Instructions: LEFT AGAINST MEDICAL ADVICE   Consultations: Treatment Team:  Petra Kuba, MD  Procedures Performed:  Dg Ankle Complete Right  02/17/2013   *RADIOLOGY REPORT*  Clinical Data: Right medial ankle pain, swelling.  Recent surgery.  RIGHT ANKLE - COMPLETE 3+ VIEW  Comparison: 12/13/2012  Findings: No acute bony abnormality.  Specifically, no fracture, subluxation, or dislocation.  Soft tissues are intact. Joint spaces are maintained.  Normal bone mineralization.  IMPRESSION: No acute bony abnormality.   Original Report Authenticated  By: Charlett Nose, M.D.   Dg Abd Acute W/chest  03/07/2013   *RADIOLOGY REPORT*  Clinical Data: GI bleeding  ACUTE ABDOMEN SERIES (ABDOMEN 2 VIEW & CHEST 1 VIEW)  Comparison: Prior chest x-ray 02/02/2013; abdominal ultrasound 02/04/2013; prior CT abdomen and pelvis 12/07/2011  Findings: The lungs are clear.  No pulmonary edema, focal consolidation, pleural effusion or pneumothorax.  Stable cardiac and mediastinal contours which within normal limits.  Mild central bronchitic changes. Stable calcification versus other density in the mid epigastric region.  On prior CT, this is on the undersurface of the anterior abdominal wall.  Nonspecific bowel gas pattern.  Gas  and stool noted throughout the colon to the level of the rectum without evidence of obstruction.  Prominence of the hepatic and splenic shadows.  No definite organic calcification. 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  No free air.  IMPRESSION:  1.  No acute cardiopulmonary disease. 2.  Nonobstructed bowel gas pattern. 3.  Prominent hepatic and splenic shadows.  Query hepatic splenomegaly.   Original Report Authenticated By: Malachy Moan, M.D.   Dg Foot Complete Right  02/17/2013   *RADIOLOGY REPORT*  Clinical Data: Stepped on nail.  Foot pain.  RIGHT FOOT COMPLETE - 3+ VIEW  Comparison: None  Findings: Mild pes planus.  No radiopaque foreign bodies within the soft tissues.  Moderate to advanced degenerative changes at the first MTP joint.  No fracture, subluxation or dislocation.  IMPRESSION: No acute bony abnormality.   Original Report Authenticated By: Charlett Nose, M.D.   Admission HPI: Mark Christensen is a 52 yo man with a PMH of Hep B, Hep C, hepatic cirrhosis with documented esophageal varices s/p banding who presents to the ED with complaints of hematemesis. He admits to three episodes of vomiting gross blood which began this morning. States he had a BM consisting of green stool this AM but denies any blood per rectum or melanotic stools. Denies any abdominal pain worsened over baseline RUQ pain. Denies any recent illness, fever, or chills. States he had his spironolactone restarted by his PCP ~2 weeks ago but denies any other changes in his medications and states he has been compliant with his medication regimen as prescribed. Mark Christensen was admitted ~1 month ago for similar complaints and was ultimately discharged on protonix and ciprofloxacin after having an EGD which revealed varices s/p banding.   Hospital Course by problem list:    Esophageal varices in cirrhosis--presented with hematemesis and abdominal pain.  S/p endoscopy 6/14 by Dr. Ewing Schlein with 2 bands placed for  distal 1-2+ varices. No active bleeding noted. Hb stable during admission, Hb 13.7 on discharge. Was initially started on rocephin for SBP prophylasix and also started on octreotide that he was weaned off from.  He was also started on propranalol and protonix during admission, and recommended to avoid NSAIDS and continue PPI with GI follow up as outpatient by GI.  Initially NPO, then advanced to liquid diet, and on the day he left AMA, his diet was advanced to soft solids as tolerated but then made NPO again for ultrasound by GI.  Recommended to follow up with GI, Dr. Dulce Sellar in 1-2 weeks; however, patient left AGAINST MEDICAL ADVICE TODAY.     Cirrhosis of liver--hx of hepatitis B and C and alcohol abuse.  Denies drinking alcohol but has been taking NSAIDS.  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 Follow up with PCP and GI.  Consider ID follow up.     Abdominal  pain--diffuse abdominal tenderness, minimal relief with narcotics.  On oxycodone at home prn.  Claims morphine does not work for him and makes him sick but asks for Dilaudid and tolerates well.  Was scheduled to get abdominal ultrasound 03/09/13 to look for cholecystitis per GI, but he left AGAINST MEDICAL ADVICE prior to getting ultrasound done.      Hypertension-is not on home medication at this time, claims they were stopped by pcp per chart notes.  Possibly elevated secondary to pain as well. Recommended to follow up with PCP.     Discharge Vitals:   BP 151/103  Pulse 60  Temp(Src) 97.9 F (36.6 C) (Oral)  Resp 20  Ht 6\' 4"  (1.93 m)  Wt 211 lb 13.8 oz (96.1 kg)  BMI 25.8 kg/m2  SpO2 100%  Discharge Labs:  Results for orders placed during the hospital encounter of 03/07/13 (from the past 24 hour(s))  COMPREHENSIVE METABOLIC PANEL     Status: Abnormal   Collection Time    03/09/13  4:28 AM      Result Value Range   Sodium 136  135 - 145 mEq/L   Potassium 3.8  3.5 - 5.1 mEq/L   Chloride 101  96 - 112 mEq/L   CO2 25  19 -  32 mEq/L   Glucose, Bld 118 (*) 70 - 99 mg/dL   BUN 13  6 - 23 mg/dL   Creatinine, Ser 1.61  0.50 - 1.35 mg/dL   Calcium 9.0  8.4 - 09.6 mg/dL   Total Protein 7.4  6.0 - 8.3 g/dL   Albumin 3.0 (*) 3.5 - 5.2 g/dL   AST 32  0 - 37 U/L   ALT 31  0 - 53 U/L   Alkaline Phosphatase 128 (*) 39 - 117 U/L   Total Bilirubin 0.4  0.3 - 1.2 mg/dL   GFR calc non Af Amer >90  >90 mL/min   GFR calc Af Amer >90  >90 mL/min  CBC     Status: Abnormal   Collection Time    03/09/13  7:15 AM      Result Value Range   WBC 5.1  4.0 - 10.5 K/uL   RBC 4.67  4.22 - 5.81 MIL/uL   Hemoglobin 13.7  13.0 - 17.0 g/dL   HCT 04.5  40.9 - 81.1 %   MCV 85.2  78.0 - 100.0 fL   MCH 29.3  26.0 - 34.0 pg   MCHC 34.4  30.0 - 36.0 g/dL   RDW 91.4  78.2 - 95.6 %   Platelets 83 (*) 150 - 400 K/uL   Signed: Darden Palmer, MD 03/09/2013, 3:48 PM   Time Spent on Discharge: 20 minutes Services Ordered on Discharge: LEFT AMA Equipment Ordered on Discharge: LEFT AMA

## 2013-03-09 NOTE — Progress Notes (Signed)
Report called to [facility] on 6700.

## 2013-03-09 NOTE — Progress Notes (Signed)
C/o rt. Quad pain. 8/10. MD notified and will see on rounds. States the oxycodone does not help with the pain.

## 2013-03-09 NOTE — ED Provider Notes (Signed)
History     CSN: [id number]  Arrival date & time 03/09/13  2222   First MD Initiated Contact with Patient 03/09/13 2314      Chief Complaint  Patient presents with  . Abdominal Pain  . Loss of Consciousness    1 minute syncopal episode at home (witnessed by family)  . GI Bleeding    diagnosed on 03/07/13 (patient left AMA)   HPI Mark Christensen is a 52 y.o. 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 patient was admitted to the hospital to the internal medicine service yesterday however had left AGAINST MEDICAL ADVICE. Patient is scheduled to undergo a right upper quadrant ultrasound. Patient left the hospital saying he needed to take care of his wife who recently had a stroke and his son who is handicapped.  Patient comes in complaining about a severe, sharp stabbing right upper quadrant pain, worse after eating, "can't ride in nothing rough" took his meds today, he vomited one time after eating today. He said he was outside, he was hot, he was watering his garden and he felt lightheaded. Patient went inside and had an syncopal episode that was reported to be about a minute long. Family said patient had been tired all day.    Past Medical History  Diagnosis Date  . Hepatic cirrhosis   . Hypertension     NO LONGER TAKING B/P MEDS--STATES DISCONTINUED BY HIS DOCTOR  . Anxiety   . Shortness of breath   . GERD (gastroesophageal reflux disease)   . Restless leg syndrome   . Complication of anesthesia     ENDO/COLONOSCOPY ATTEMPTED IN THE PAST-UNABLE TO ADEQUATELY SEDATE PT-TOLD WOULD NEED ANESTHESIA FOR FUTURE PROCEDURES  . Pneumonia 09-2012  . Headache(784.0)     HX OF MIGRAINES  . Arthritis     hands  . Hepatitis B 01-30-13    liver enzymes remains elevated  . Hepatitis C   . Wound healing, delayed 01-30-13    3-22-'14 s/p I&D right ankle surgery(infection) -minimal drainage with dressing    Past Surgical History  Procedure Laterality Date  .  Appendectomy    . Esophageal banding      FOR TX OF GI BLEED ABOUT 2008  . Arm debridement  08-11-12    right forearm wound with debridement 2 months ago/ right abdomen also-healing well  . Multiple tooth extractions  08/12/2012  . Colonoscopy with propofol  08/13/2012    Procedure: COLONOSCOPY WITH PROPOFOL;  Surgeon: Willis Modena, MD;  Location: [facility] ENDOSCOPY;  Service: Endoscopy;  Laterality: N/A;  . 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 Esophagogastroduodenoscopy N/A 11/18/2012    Procedure: ESOPHAGOGASTRODUODENOSCOPY (EGD);  Surgeon: Shirley Friar, MD;  Location: [facility] ENDOSCOPY;  Service: Endoscopy;  Laterality: N/A;  . Esophageal banding  11/18/2012    Procedure: ESOPHAGEAL BANDING;  Surgeon: Shirley Friar, MD;  Location: [facility] ENDOSCOPY;  Service: Endoscopy;;  . I&d extremity Right 12/13/2012    Procedure: IRRIGATION AND DEBRIDEMENT EXTREMITY;  Surgeon: Shelda Pal, MD;  Location: [facility] OR;  Service: Orthopedics;  Laterality: Right;  . Ankle surgery      Debridement of ankle 12-13-12-[place]/ Dr. Ashley Royalty  . Peripherally inserted central catheter insertion      insertion and removal(IVR)  . Esophagogastroduodenoscopy N/A 02/03/2013    Procedure: ESOPHAGOGASTRODUODENOSCOPY (EGD);  Surgeon: Shirley Friar, MD;  Location: [facility] ENDOSCOPY;  Service: Endoscopy;  Laterality: N/A;  . Esophagogastroduodenoscopy N/A 03/07/2013    Procedure: ESOPHAGOGASTRODUODENOSCOPY (EGD);  Surgeon: Petra Kuba, MD;  Location: [facility] ENDOSCOPY;  Service: Endoscopy;  Laterality: N/A;  . Esophageal banding  03/07/2013    Procedure: ESOPHAGEAL BANDING;  Surgeon: Petra Kuba, MD;  Location: [facility] ENDOSCOPY;  Service: Endoscopy;;    No family history on file.  History  Substance Use Topics  . Smoking status: Current Every Day Smoker -- 0.50 packs/day for 30 years    Types: Cigarettes  . Smokeless tobacco: Never Used  . Alcohol Use: No     Comment: RARELY USES ALCOHOL       Review of Systems At least 10pt or greater review of systems completed and are negative except where specified in the HPI.  Allergies  Tylenol; Asa; Morphine and related; and Tramadol  Home Medications   Current Outpatient Rx  Name  Route  Sig  Dispense  Refill  . albuterol (PROVENTIL HFA;VENTOLIN HFA) 108 (90 BASE) MCG/ACT inhaler   Inhalation   Inhale 1-2 puffs into the lungs every 4 (four) hours as needed for wheezing. For wheezing         . amitriptyline (ELAVIL) 25 MG tablet   Oral   Take 25 mg by mouth at bedtime.         . ciprofloxacin (CIPRO) 500 MG tablet   Oral   Take 1 tablet (500 mg total) by mouth 2 (two) times daily.   16 tablet   0   . docusate sodium (COLACE) 100 MG capsule   Oral   Take 100 mg by mouth 2 (two) times daily as needed for constipation. For constipation         . FLUoxetine (PROZAC) 40 MG capsule   Oral   Take 40 mg by mouth daily.         . nabumetone (RELAFEN) 500 MG tablet   Oral   Take 500 mg by mouth 2 (two) times daily.         Marland Kitchen oxyCODONE (OXY IR/ROXICODONE) 5 MG immediate release tablet   Oral   Take 1 tablet (5 mg total) by mouth every 4 (four) hours as needed for pain. For pain   30 tablet   0   . pantoprazole (PROTONIX) 40 MG tablet   Oral   Take 1 tablet (40 mg total) by mouth daily.   30 tablet   11   . polyethylene glycol (MIRALAX / GLYCOLAX) packet   Oral   Take 17 g by mouth daily as needed. For constipation         . promethazine (PHENERGAN) 25 MG tablet   Oral   Take 25 mg by mouth every 6 (six) hours as needed for nausea.         Marland Kitchen sulfamethoxazole-trimethoprim (SEPTRA DS) 800-160 MG per tablet   Oral   Take 1 tablet by mouth every 12 (twelve) hours.   20 tablet   0     BP 122/89  Pulse 64  Temp(Src) 97.6 F (36.4 C)  Resp 13  SpO2 96%  Physical Exam  Nursing notes reviewed.  Electronic medical record reviewed. VITAL SIGNS:   Filed Vitals:   03/10/13 0300 03/10/13 0400  03/10/13 0442 03/10/13 0600  BP: 107/67 108/68 108/68 144/92  Pulse: 58 67 67   Temp:   97.7 F (36.5 C) 97.6 F (36.4 C)  TempSrc:   Oral Oral  Resp: 14 16 14 18   Height:    6'  4" (1.93 m)  Weight:    209 lb (94.802 kg)  SpO2: 97% 95% 98% 99%   CONSTITUTIONAL: Awake, oriented, appears non-toxic, but uncomfortable HENT: Atraumatic, normocephalic, oral mucosa pink and moist, airway patent. Nares patent without drainage. External ears normal. EYES: Conjunctiva clear, EOMI, PERRLA NECK: Trachea midline, non-tender, supple CARDIOVASCULAR: Normal heart rate, Normal rhythm, No murmurs, rubs, gallops PULMONARY/CHEST: Clear to auscultation, no rhonchi, wheezes, or rales. Symmetrical breath sounds. Non-tender. ABDOMINAL: Non-distended, soft, right upper quadrant pain without rebound or guarding.  BS normal. NEUROLOGIC: Non-focal, moving all four extremities, no gross sensory or motor deficits. EXTREMITIES: No clubbing, cyanosis, or edema SKIN: Warm, Dry, No erythema, No rash  ED Course  Procedures (including critical care time)  Labs Reviewed  CBC WITH DIFFERENTIAL - Abnormal; Notable for the following:    HCT 37.9 (*)    RDW 15.7 (*)    Platelets 54 (*)    Eosinophils Relative 6 (*)    All other components within normal limits  COMPREHENSIVE METABOLIC PANEL - Abnormal; Notable for the following:    Glucose, Bld 178 (*)    Albumin 3.3 (*)    AST 67 (*)    Alkaline Phosphatase 143 (*)    All other components within normal limits  URINALYSIS, ROUTINE W REFLEX MICROSCOPIC - Abnormal; Notable for the following:    Color, Urine AMBER (*)    Bilirubin Urine SMALL (*)    All other components within normal limits  LIPASE, BLOOD  AMMONIA  ETHANOL  TROPONIN I  URINE RAPID DRUG SCREEN (HOSP PERFORMED)  TROPONIN I  TROPONIN I  POCT I-STAT TROPONIN I   No results found.   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 Abdominal pain   3. Cirrhosis of liver   4. Hypertension   5. Syncope    6. Hepatitis C, without hepatic coma       MDM  Patient with a history of varices with recent banding, denies any hemoptysis or hematochezia or melena today, he presents with syncopal episode. Patient not actively vomiting in the emergency department. Patient does have a history of gallstones, however they consider him too high risk for surgery. Patient returns for further evaluation and therapy.  Labs are unremarkable for admission, patient still has some mild transaminitis, no increase in white count, no stark decrease in H&H. Patient be admitted for continued pain, right upper quadrant evaluation.          Jones Skene, MD 03/10/13 1610

## 2013-03-09 NOTE — Progress Notes (Signed)
Internal Medicine Teaching Service Attending Note Date: 03/09/2013  Patient name: Mark Christensen  Medical record number: [id number]  Date of birth: 1961/06/26  I have met with my team in the morning, we saw the patient together and discussed the plan of care.   In brief, [name] is a 52 year old male with cirrhosis who has RUQ pain (chronic), had hematemesis at home per his report, and EGD over the weekend which did not show any active bleeding, and minimal old blood in stomach. Few varices, banding done. 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   He  has a past medical history of Hepatic cirrhosis; Hypertension; Anxiety; Shortness of breath; GERD (gastroesophageal reflux disease); Restless leg syndrome; Complication of anesthesia; Pneumonia (09-2012); Headache(784.0); Arthritis; Hepatitis B (01-30-13); Hepatitis C; and Wound healing, delayed (01-30-13).  Home meds, include: Amitriptyline Ciprofloxacin Docusates Fluoxetine Nabumetone     Oxycodone Pantoperazole Promethazine Bactrim  Vitals Filed Vitals:   03/09/13 0500 03/09/13 0757 03/09/13 0931 03/09/13 1150  BP:  144/98 140/99 151/103  Pulse:  65 66 60  Temp:  97.6 F (36.4 C)  97.9 F (36.6 C)  TempSrc:  Oral    Resp:  12  20  Height:      Weight: 211 lb 13.8 oz (96.1 kg)     SpO2:  100%  100%   Exam  General: Resting, complains of right abdominal pain, no acute distress. HEENT: PERRL, EOMI, no scleral icterus. Heart: RRR, no rubs, murmurs or gallops. Lungs: Clear to auscultation bilaterally, no wheezes, rales, or rhonchi. Abdomen: Soft, diffuse mild tenderness, nondistended, BS present. Extremities: Warm, no pedal edema, no asterixis. Neuro: Alert and oriented X3, cranial nerves II-XII grossly intact, sensory and motor grossly intact.  Medications in the hospital . cefTRIAXone (ROCEPHIN)  IV  1 g Intravenous Q24H  . pantoprazole  40 mg Oral BID AC  . propranolol  10 mg Oral TID  . sodium chloride  3 mL  Intravenous Q12H  Octreotide discontinued today. PRN opiates for pain   Recent Labs Lab 03/07/13 1008 03/08/13 0615 03/09/13 0715  HGB 13.9 13.1 13.7  HCT 40.3 38.3* 39.8  WBC 7.1 4.5 5.1  PLT 111* 90* 83*    Recent Labs Lab 03/07/13 1008 03/08/13 0615 03/09/13 0428  NA 137 135 136  K 4.4 4.1 3.8  CL 102 101 101  CO2 25 26 25   GLUCOSE 104* 115* 118*  BUN 21 20 13   CREATININE 0.82 0.93 0.96  CALCIUM 10.4 8.8 9.0    Recent Labs Lab 03/07/13 1008 03/08/13 0615 03/09/13 0428  AST 50* 36 32  ALT 48 37 31  ALKPHOS 170* 133* 128*  BILITOT 0.3 0.4 0.4  PROT 8.3 7.4 7.4  ALBUMIN 3.3* 3.0* 3.0*  INR 1.05  --   --    Imaging  USG (02/04/13) 1. Morphologic features of the liver compatible with cirrhosis.  2. Gallbladder sludge. The gallbladder wall is prominent  measuring up to 3.4 mm. Negative sonographic Murphy's sign. No  pericholecystic fluid.  3. Bilateral renal cyst peri  4. Splenomegaly.    Assessment and Plan   GI Bleed with varices, cirrhosis - CBC stable. BUN/Creatinine at admission 26 - favors bleed, getting better. Endoscopy done. Dr. Marge Duncans note from today reviewed, recommendations appreciated. Octreotide discontinued today. Agree with ceftriaxone prophylaxis, pantoperazole and propranolol. Agree with stopping Nabumetone. Unsure, why the patient requires oxycontin as outpatient. Will counsel the patient about the GI side effects of opiates. We will minimize opiate  administration to him, and will give medicine, only when requested after careful assessment. For cirrhosis, the patient will follow Dr. Dulce Sellar as outpatient.   Hypertension, poorly controlled - We will monitor blood pressure on propranolol. If needed, we will add another medication.   Thrombocytopenia, worsening - Likely secondary to chronic liver disease, though it is mentioned as <1% side effects of octreotide. We will continue to monitor.     Resident note reviewed.  Mark Christensen,  Mark Christensen 03/09/2013, 1:42 PM.

## 2013-03-09 NOTE — Progress Notes (Signed)
Subjective:    [name] was seen and examined at bedside.  He is complaining of epigastric abdominal pain again this morning, requesting something stronger than morphine as that makes him feel sick.  He denies any more episodes have hematemesis and has not had a BM since admission.  He is hungry and requesting solid food today.  He denies any fever, chills, chest pain, shortness of breath, diarrhea, or dysuria at this time.    Objective:    Vital Signs:   Temp:  [97.3 F (36.3 C)-99.3 F (37.4 C)] 97.6 F (36.4 C) (06/16 0757) Pulse Rate:  [60-100] 66 (06/16 0931) Resp:  [7-18] 12 (06/16 0757) BP: (96-144)/(45-99) 140/99 mmHg (06/16 0931) SpO2:  [98 %-100 %] 100 % (06/16 0757) Weight:  [211 lb 13.8 oz (96.1 kg)] 211 lb 13.8 oz (96.1 kg) (06/16 0500) Last BM Date: 03/06/13  24-hour weight change: Weight change: 6 lb 13.8 oz (3.113 kg)  Intake/Output:   Intake/Output Summary (Last 24 hours) at 03/09/13 1103 Last data filed at 03/09/13 0759  Gross per 24 hour  Intake 1417.5 ml  Output   5825 ml  Net -4407.5 ml      Physical Exam: General: Vital signs reviewed and noted.  Lungs:  Normal respiratory effort. Clear to auscultation BL without crackles or wheezes.  Heart: RRR. S1 and S2 normal without gallop, murmur, or rubs.  Abdomen:  BS normoactive. Soft, Nondistended, diffuse abdominal tenderness to palpation, more in epigastric region.  No masses or organomegaly.  Extremities: No pretibial edema.+2dp b/l    Labs:  Basic Metabolic Panel:  Recent Labs Lab 03/07/13 1008 03/08/13 0615 03/09/13 0428  NA 137 135 136  K 4.4 4.1 3.8  CL 102 101 101  CO2 25 26 25   GLUCOSE 104* 115* 118*  BUN 21 20 13   CREATININE 0.82 0.93 0.96  CALCIUM 10.4 8.8 9.0   Liver Function Tests:  Recent Labs Lab 03/07/13 1008 03/08/13 0615 03/09/13 0428  AST 50* 36 32  ALT 48 37 31  ALKPHOS 170* 133* 128*  BILITOT 0.3 0.4 0.4  PROT 8.3 7.4 7.4  ALBUMIN 3.3* 3.0* 3.0*     Recent Labs Lab 03/07/13 1008  LIPASE 23   CBC:  Recent Labs Lab 03/07/13 1008 03/08/13 0615 03/09/13 0715  WBC 7.1 4.5 5.1  NEUTROABS 4.4  --   --   HGB 13.9 13.1 13.7  HCT 40.3 38.3* 39.8  MCV 84.7 85.3 85.2  PLT 111* 90* 83*    Cardiac Enzymes:  Recent Labs Lab 03/07/13 1530  TROPONINI <0.30    Microbiology: Results for orders placed during the hospital encounter of 03/07/13  MRSA PCR SCREENING     Status: None   Collection Time    03/07/13  3:49 PM      Result Value Range Status   MRSA by PCR NEGATIVE  NEGATIVE Final   Comment:            The GeneXpert MRSA Assay (FDA     approved for NASAL specimens     only), is one component of a     comprehensive MRSA colonization     surveillance program. It is not     intended to diagnose MRSA     infection nor to guide or     monitor treatment for     MRSA infections.    Coagulation Studies:  Recent Labs  03/07/13 1008  LABPROT 13.6  INR 1.05  Medications:    Infusions:    Scheduled Medications: . cefTRIAXone (ROCEPHIN)  IV  1 g Intravenous Q24H  . pantoprazole  40 mg Oral BID AC  . propranolol  10 mg Oral TID  . sodium chloride  3 mL Intravenous Q12H    PRN Medications: albuterol, ondansetron (ZOFRAN) IV, ondansetron, oxyCODONE  Assessment/ Plan:   Mr. Salvucci is a 52 year old male with PMH Hepatitis B, C, and cirrhosis presenting with recurrent episodes of hematemesis and dark stool.  EGD 03/07/13: small clot in esophagus with minimal old blood in stomach, 1-2+ distal varices s/p 2 bands in distal esophagus; significant portal gastropathy and small antral nodule.    Esophageal varices - s/p endoscopy 6/14 by Dr. Ewing Schlein with 2 bands placed for distal 1-2+ varices. No active bleeding noted. Hb stable during admission - d/c octreotide today - PO protonix 40mg  bid - propranolol 10mg  tid - cont rocephin for SBP ppx - advance diet to soft solids and ADAT  Liver cirrhosis - Secondary to  previous EtOH abuse, Hep B, and Hep C. Pt follows with Dr. Dulce Sellar of [facility]. Albumin = 3.3. INR = 1.05. -follow up with Dr. Dulce Sellar as outpatient   DVT PPX - SCDs  CODE STATUS - full  CONSULTS PLACED - GI  DISPO - possible d/c home today  The patient does have a current PCP Kaleen Mask, MD) and does not need an [facility] hospital follow-up appointment after discharge.    Does the patient have transportation limitations that hinder transportation to clinic appointments? unknown   SERVICE NEEDED AT DISCHARGE - TO BE DETERMINED DURING HOSPITAL COURSE         Y = Yes, Blank = No PT:   OT:   RN:   Equipment:   Other:     Length of Stay: 2 day(s)   Signed: Darden Palmer, MD  PGY-1, Internal Medicine Resident Pager: [phone] (7AM-5PM) 03/09/2013, 11:03 AM

## 2013-03-10 ENCOUNTER — Inpatient Hospital Stay (HOSPITAL_COMMUNITY): Payer: Medicaid Other

## 2013-03-10 ENCOUNTER — Encounter (HOSPITAL_COMMUNITY): Payer: Self-pay | Admitting: Internal Medicine

## 2013-03-10 DIAGNOSIS — D696 Thrombocytopenia, unspecified: Secondary | ICD-10-CM

## 2013-03-10 DIAGNOSIS — B1920 Unspecified viral hepatitis C without hepatic coma: Secondary | ICD-10-CM

## 2013-03-10 DIAGNOSIS — I8510 Secondary esophageal varices without bleeding: Secondary | ICD-10-CM

## 2013-03-10 DIAGNOSIS — G2581 Restless legs syndrome: Secondary | ICD-10-CM | POA: Insufficient documentation

## 2013-03-10 DIAGNOSIS — I517 Cardiomegaly: Secondary | ICD-10-CM

## 2013-03-10 DIAGNOSIS — K7460 Unspecified cirrhosis of liver: Secondary | ICD-10-CM

## 2013-03-10 DIAGNOSIS — R109 Unspecified abdominal pain: Secondary | ICD-10-CM

## 2013-03-10 DIAGNOSIS — R55 Syncope and collapse: Secondary | ICD-10-CM

## 2013-03-10 LAB — CBC WITH DIFFERENTIAL/PLATELET
Basophils Absolute: 0.1 10*3/uL (ref 0.0–0.1)
Basophils Relative: 1 % (ref 0–1)
Eosinophils Relative: 6 % — ABNORMAL HIGH (ref 0–5)
HCT: 37.9 % — ABNORMAL LOW (ref 39.0–52.0)
Hemoglobin: 13 g/dL (ref 13.0–17.0)
MCH: 29.4 pg (ref 26.0–34.0)
MCHC: 34.3 g/dL (ref 30.0–36.0)
MCV: 85.7 fL (ref 78.0–100.0)
Monocytes Absolute: 0.5 10*3/uL (ref 0.1–1.0)
Monocytes Relative: 8 % (ref 3–12)
Neutro Abs: 3.3 10*3/uL (ref 1.7–7.7)
RDW: 15.7 % — ABNORMAL HIGH (ref 11.5–15.5)

## 2013-03-10 LAB — COMPREHENSIVE METABOLIC PANEL
Albumin: 3.3 g/dL — ABNORMAL LOW (ref 3.5–5.2)
BUN: 16 mg/dL (ref 6–23)
Calcium: 9.2 mg/dL (ref 8.4–10.5)
Chloride: 102 mEq/L (ref 96–112)
Creatinine, Ser: 0.95 mg/dL (ref 0.50–1.35)
GFR calc non Af Amer: >90 mL/min (ref >90)
Total Bilirubin: 0.3 mg/dL (ref 0.3–1.2)

## 2013-03-10 LAB — RAPID URINE DRUG SCREEN, HOSP PERFORMED
Amphetamines: NOT DETECTED
Cocaine: POSITIVE — AB
Opiates: NOT DETECTED
Tetrahydrocannabinol: NOT DETECTED

## 2013-03-10 LAB — AMMONIA: Ammonia: 39 umol/L (ref 11–60)

## 2013-03-10 LAB — LIPASE, BLOOD: Lipase: 20 U/L (ref 11–59)

## 2013-03-10 LAB — TROPONIN I: Troponin I: <0.3 ng/mL (ref <0.30)

## 2013-03-10 MED ORDER — NICOTINE 14 MG/24HR TD PT24
14.0000 mg | MEDICATED_PATCH | Freq: Every day | TRANSDERMAL | Status: DC
  Administered 2013-03-10: 14 mg via TRANSDERMAL
  Filled 2013-03-10 (×2): qty 1

## 2013-03-10 MED ORDER — FLUOXETINE HCL 40 MG PO CAPS: 40.0000 mg | ORAL_CAPSULE | Freq: Every day | ORAL | Status: DC

## 2013-03-10 MED ORDER — ONDANSETRON HCL 4 MG PO TABS: 4.0000 mg | ORAL_TABLET | Freq: Three times a day (TID) | ORAL | Status: DC | PRN

## 2013-03-10 MED ORDER — PROMETHAZINE HCL 25 MG PO TABS: 25.0000 mg | ORAL_TABLET | Freq: Four times a day (QID) | ORAL | Status: DC | PRN

## 2013-03-10 MED ORDER — PANTOPRAZOLE SODIUM 40 MG PO TBEC
40.0000 mg | DELAYED_RELEASE_TABLET | Freq: Two times a day (BID) | ORAL | Status: DC
  Administered 2013-03-10 (×2): 40 mg via ORAL
  Filled 2013-03-10 (×2): qty 1

## 2013-03-10 MED ORDER — SODIUM CHLORIDE 0.9 % IJ SOLN
3.0000 mL | Freq: Two times a day (BID) | INTRAMUSCULAR | Status: DC
  Administered 2013-03-10: 3 mL via INTRAVENOUS

## 2013-03-10 MED ORDER — SODIUM CHLORIDE 0.9 % IV SOLN: INTRAVENOUS | Status: AC

## 2013-03-10 MED ORDER — AMITRIPTYLINE HCL 25 MG PO TABS
25.0000 mg | ORAL_TABLET | Freq: Every day | ORAL | Status: DC
  Administered 2013-03-10: 25 mg via ORAL
  Filled 2013-03-10 (×2): qty 1

## 2013-03-10 MED ORDER — ALBUTEROL SULFATE HFA 108 (90 BASE) MCG/ACT IN AERS: 1.0000 | INHALATION_SPRAY | RESPIRATORY_TRACT | Status: DC | PRN

## 2013-03-10 MED ORDER — PROPRANOLOL HCL 10 MG PO TABS
10.0000 mg | ORAL_TABLET | Freq: Three times a day (TID) | ORAL | Status: DC
  Administered 2013-03-10 (×3): 10 mg via ORAL
  Filled 2013-03-10 (×6): qty 1

## 2013-03-10 MED ORDER — PANTOPRAZOLE SODIUM 40 MG PO TBEC: 40.0000 mg | DELAYED_RELEASE_TABLET | Freq: Every day | ORAL | Status: DC

## 2013-03-10 MED ORDER — FLUOXETINE HCL 20 MG PO CAPS
40.0000 mg | ORAL_CAPSULE | Freq: Every day | ORAL | Status: DC
  Administered 2013-03-10: 40 mg via ORAL
  Filled 2013-03-10 (×2): qty 2

## 2013-03-10 MED ORDER — DEXTROSE 5 % IV SOLN
1.0000 g | INTRAVENOUS | Status: DC
  Administered 2013-03-10 – 2013-03-11 (×2): 1 g via INTRAVENOUS
  Filled 2013-03-10 (×2): qty 10

## 2013-03-10 MED ORDER — OXYCODONE HCL 5 MG PO TABS
5.0000 mg | ORAL_TABLET | Freq: Four times a day (QID) | ORAL | Status: DC | PRN
  Administered 2013-03-10 – 2013-03-11 (×2): 5 mg via ORAL
  Filled 2013-03-10 (×2): qty 1

## 2013-03-10 NOTE — Progress Notes (Signed)
  Echocardiogram 2D Echocardiogram has been performed.  Mark Christensen 03/10/2013, 3:10 PM

## 2013-03-10 NOTE — H&P (Addendum)
Internal Medicine Teaching Service Attending Note Date: 03/10/2013  Patient name: Mark Christensen  Medical record number: [id number]  Date of birth: May 25, 1961   Mr Midgley left AMA yesterday. He is back after a syncopal episode s/p non-bloody vomiting. Still complains of RUQ pain, however does not seem to be in acute distress. He reports no more vomiting. Exam today is unchanged from prior. I have read the note by Dr. Zada Girt and I agree with the documentation.   Labs and imaging reviewed.    Recent Labs Lab 03/07/13 1008 03/08/13 0615 03/09/13 0428 03/09/13 2339  AST 50* 36 32 67*  ALT 48 37 31 44  ALKPHOS 170* 133* 128* 143*  BILITOT 0.3 0.4 0.4 0.3  PROT 8.3 7.4 7.4 7.7  ALBUMIN 3.3* 3.0* 3.0* 3.3*  INR 1.05  --   --   --      Recent Labs Lab 03/07/13 1530 03/10/13 0409 03/10/13 0920  TROPONINI <0.30 <0.30 <0.30   Drugs of Abuse     Component Value Date/Time   LABOPIA NONE DETECTED 03/10/2013 0335   LABOPIA  Value: POSITIVE (NOTE) Result repeated and verified. Sent for confirmatory testing* 06/05/2010 0629   COCAINSCRNUR POSITIVE* 03/10/2013 0335   COCAINSCRNUR NEGATIVE 06/05/2010 0629   LABBENZ POSITIVE* 03/10/2013 0335   LABBENZ  Value: POSITIVE (NOTE) Result repeated and verified. Sent for confirmatory testing* 06/05/2010 0629   AMPHETMU NONE DETECTED 03/10/2013 0335   AMPHETMU NEGATIVE 06/05/2010 0629   THCU NONE DETECTED 03/10/2013 0335   LABBARB NONE DETECTED 03/10/2013 0335       Assessment and Plan   Cirrhosis and Varices, and significant portal gastropathy -  Has no more bleeding, and is doing relatively better pain wise. USG abdomen shows no acute abnormality. GI has worked the patient up extensively and now signed off. We would instruct the patient to stop NSAIDs and decrease oxycontin to as minimum as possible.   Syncope - Likely due to dehydration, poor po intake or vasovagal in nature after vomiting. No significant events in tele so far. EKG reviewed from  614 am. No AMI changes, noted prolonged QTc. Serial troponins negative. The patient was not orthostatic (vitals done after bolus) but complained of dizziness. 2D echo has been performed. Once the results are back tomorrow, we will hope to d/c the patient.    Polysubstance abuse - The patient denies, however his urine is positive for cocaine and benzos. He denies chest pain. Chronology of exposure to cocaine is unclear.  We will continue beta blockers (propranolol) for now for varices.    Other chronic issues per resident note.   Aletta Edouard 03/10/2013, 4:13 PM.

## 2013-03-10 NOTE — Progress Notes (Signed)
Mark Christensen 9:21 AM  Subjective: Patient doing better without signs of bleeding and no new complaints awaiting ultrasound  Objective: Vital signs stable no acute distress abdomen is soft nontender hemoglobin and BUN stable Assessment: Cirrhosis and varices  Plan: Await ultrasound and may eat soft solids and please let us know if we could be of any further assistance  [facility]

## 2013-03-10 NOTE — Discharge Summary (Signed)
Name: Mark Christensen MRN: [id number] DOB: [birth year] 52 y.o. PCP: Kaleen Mask, MD  Date of Admission: 03/09/2013 10:22 PM Date of Discharge: 03/10/2013 Attending Physician: Aletta Edouard, MD  Discharge Diagnosis: Principal Problem:   Syncope Active Problems:   Cirrhosis of liver   Esophageal varices in cirrhosis   Abdominal pain   Hypertension   Thrombocytopenia  Discharge Medications:   Medication List    STOP taking these medications       ciprofloxacin 500 MG tablet  Commonly known as:  CIPRO     nabumetone 500 MG tablet  Commonly known as:  RELAFEN     sulfamethoxazole-trimethoprim 800-160 MG per tablet  Commonly known as:  SEPTRA DS      TAKE these medications       albuterol 108 (90 BASE) MCG/ACT inhaler  Commonly known as:  PROVENTIL HFA;VENTOLIN HFA  Inhale 1-2 puffs into the lungs every 4 (four) hours as needed for wheezing. For wheezing     amitriptyline 25 MG tablet  Commonly known as:  ELAVIL  Take 25 mg by mouth at bedtime.     docusate sodium 100 MG capsule  Commonly known as:  COLACE  Take 100 mg by mouth 2 (two) times daily as needed for constipation. For constipation     FLUoxetine 40 MG capsule  Commonly known as:  PROZAC  Take 40 mg by mouth daily.     oxyCODONE 5 MG immediate release tablet  Commonly known as:  Oxy IR/ROXICODONE  Take 1 tablet (5 mg total) by mouth every 4 (four) hours as needed for pain. For pain     pantoprazole 40 MG tablet  Commonly known as:  PROTONIX  Take 1 tablet (40 mg total) by mouth daily.     polyethylene glycol packet  Commonly known as:  MIRALAX / GLYCOLAX  Take 17 g by mouth daily as needed. For constipation     promethazine 25 MG tablet  Commonly known as:  PHENERGAN  Take 25 mg by mouth every 6 (six) hours as needed for nausea.     propranolol 10 MG tablet  Commonly known as:  INDERAL  Take 1 tablet (10 mg total) by mouth 3 (three) times daily.       Disposition and follow-up:    Mr.Mark Christensen was discharged from [facility] in Stable condition.  At the hospital follow up visit please address:  1.  Cirrhosis and Esophageal varices: follow up with Dr. Dulce Sellar 1-2 weeks. Continue protonix and propranolol. 2.  Thrombocytopenia: Platelets down to 54, will need to be rechecked on follow up. 3.  Syncope: no more episodes during admission.  Echo 70-80% EF with hyperdynamic systolic function.  Consider cardiology follow up if deemed necessary. 4.  Substance abuse: UDS positive for cocaine and benzodiazepines, although patient denies any use  Labs / imaging needed at time of follow-up: repeat cbc and LFTs, monitor platelets down to 54 on 03/09/13  Follow-up Appointments: Follow-up Information   Follow up with Kaleen Mask, MD. Schedule an appointment as soon as possible for a visit on 03/18/2013. (@10am )    Contact information:   [address] [place] [address] [phone]       Follow up with Freddy Jaksch, MD. Schedule an appointment as soon as possible for a visit on 04/03/2013. (@915am )    Contact information:   [address] [place] [address] [phone]       Follow up  with Theatre stage manager [employer]). Schedule an appointment as soon as possible for a visit in 1 day.   Contact information:   [address] [place] [address] [phone]     Discharge Instructions:   Consultations: Treatment Team:  Shirley Friar, MD GI  Procedures Performed:  Dg Ankle Complete Right  02/17/2013   *RADIOLOGY REPORT*  Clinical Data: Right medial ankle pain, swelling.  Recent surgery.  RIGHT ANKLE - COMPLETE 3+ VIEW  Comparison: 12/13/2012  Findings: No acute bony abnormality.  Specifically, no fracture, subluxation, or dislocation.  Soft tissues are intact. Joint spaces are maintained.  Normal bone mineralization.  IMPRESSION: No acute bony abnormality.   Original Report  Authenticated By: Charlett Nose, M.D.   US Abdomen Complete  03/10/2013   *RADIOLOGY REPORT*  Clinical Data:  Abdominal pain  ABDOMINAL ULTRASOUND COMPLETE  Comparison:  02/04/2013  Findings:  Gallbladder:  No gallstones, gallbladder wall thickening, or pericholecystic fluid. The previously seen gallbladder sludge is no longer identified.  Common Bile Duct:  7.2 mm.  This is mildly prominent for the patient's age.  No intrahepatic biliary ductal dilatation is seen.  Liver: Coarsely echogenic with mild nodularity consistent with cirrhotic changes.  This is stable from prior exam.  IVC:  Appears normal.  Pancreas:  No abnormality identified.  Spleen:  Persistent splenomegaly.  The length is 14.1 cm.  Right kidney:  12 cm in length.  No hydronephrosis or calculi are identified. 12.6 cm cyst is noted in the upper pole of the right kidney.  It is stable in appearance but has a more simple appearance on the current examination.  Left kidney:  12.2 cm in length.  No hydronephrosis or calculi are seen. 1.9 cm cyst in the mid to upper pole of the left kidney.  A few small septations are noted within.  Abdominal Aorta:  No aneurysm identified.  IMPRESSION: Stable bilateral renal cysts.  The gallbladder sludge seen previously has resolved in the interval.  Cirrhotic change of the liver.  Stable splenomegaly.  No acute abnormality is noted.   Original Report Authenticated By: Alcide Clever, M.D.   Dg Abd Acute W/chest  03/07/2013   *RADIOLOGY REPORT*  Clinical Data: GI bleeding  ACUTE ABDOMEN SERIES (ABDOMEN 2 VIEW & CHEST 1 VIEW)  Comparison: Prior chest x-ray 02/02/2013; abdominal ultrasound 02/04/2013; prior CT abdomen and pelvis 12/07/2011  Findings: The lungs are clear.  No pulmonary edema, focal consolidation, pleural effusion or pneumothorax.  Stable cardiac and mediastinal contours which within normal limits.  Mild central bronchitic changes. Stable calcification versus other density in the mid epigastric region.  On  prior CT, this is on the undersurface of the anterior abdominal wall.  Nonspecific bowel gas pattern.  Gas and stool noted throughout the colon to the level of the rectum without evidence of obstruction.  Prominence of the hepatic and splenic shadows.  No definite organic calcification. 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  No free air.  IMPRESSION:  1.  No acute cardiopulmonary disease. 2.  Nonobstructed bowel gas pattern. 3.  Prominent hepatic and splenic shadows.  Query hepatic splenomegaly.   Original Report Authenticated By: Malachy Moan, M.D.   Dg Foot Complete Right  02/17/2013   *RADIOLOGY REPORT*  Clinical Data: Stepped on nail.  Foot pain.  RIGHT FOOT COMPLETE - 3+ VIEW  Comparison: None  Findings: Mild pes planus.  No radiopaque foreign bodies within the soft tissues.  Moderate to advanced degenerative  changes at the first MTP joint.  No fracture, subluxation or dislocation.  IMPRESSION: No acute bony abnormality.   Original Report Authenticated By: Charlett Nose, M.D.   2D Echo:  [facility]* *[facility]* [address] [place], [place] [address] [phone]  ------------------------------------------------------------ Transthoracic Echocardiography  Patient: Ivar, Domangue MR #: [id number] Study Date: 03/10/2013 Gender: M Age: 78 Height: 193cm Weight: 94.8kg BSA: 2.68m^2 Pt. Status: Room: 2036  PERFORMING Shvc SONOGRAPHER Cathie Beams ATTENDING Bonk, John-Adam ORDERING Bhardwaj, Shilpa cc:  ------------------------------------------------------------ LV EF: 70% - 80%  ------------------------------------------------------------ Indications: Syncope 780.2.  ------------------------------------------------------------ History: PMH: Cirrhosis of liver. Abdominal pain. Restless leg syndrome. Risk factors: Hypertension.  ------------------------------------------------------------ Study Conclusions  - Left ventricle:  The cavity size was normal. There was mild concentric hypertrophy. Systolic function was hyperdynamic. The estimated ejection fraction was in the range of 70% to 80%. There was dynamic obstruction at restin the outflow tract, with mid-cavity obliteration, a peak velocity of 214cm/sec, and a peak gradient of 18mm Hg. Wall motion was normal; there were no regional wall motion abnormalities. Doppler parameters are consistent with abnormal left ventricular relaxation (grade 1 diastolic dysfunction). - Left atrium: The atrium was mildly dilated. Transthoracic echocardiography. M-mode, complete 2D, spectral Doppler, and color Doppler. Height: Height: 193cm. Height: 76in. Weight: Weight: 94.8kg. Weight: 208.6lb. Body mass index: BMI: 25.4kg/m^2. Body surface area: BSA: 2.77m^2. Blood pressure: 144/92. Patient status: Inpatient. Location: Echo laboratory.  ------------------------------------------------------------  ------------------------------------------------------------ Left ventricle: The cavity size was normal. There was mild concentric hypertrophy. Systolic function was hyperdynamic. The estimated ejection fraction was in the range of 70% to 80%. There was dynamic obstruction at restin the outflow tract, with mid-cavity obliteration, a peak velocity of 214cm/sec, and a peak gradient of 18mm Hg. Wall motion was normal; there were no regional wall motion abnormalities. Doppler parameters are consistent with abnormal left ventricular relaxation (grade 1 diastolic dysfunction).  ------------------------------------------------------------ Aortic valve: Structurally normal valve. Trileaflet. Cusp separation was normal. Doppler: Transvalvular velocity was increased more than expected, due to high cardiac output. There was no stenosis. No regurgitation.  ------------------------------------------------------------ Mitral valve: Structurally normal valve. Leaflet separation was  normal. Doppler: Transvalvular velocity was within the normal range. There was no evidence for stenosis. No regurgitation.  ------------------------------------------------------------ Left atrium: The atrium was mildly dilated.  ------------------------------------------------------------ Right ventricle: The cavity size was normal. Wall thickness was normal. Systolic function was normal.  ------------------------------------------------------------ Pulmonic valve: Poorly visualized.  ------------------------------------------------------------ Tricuspid valve: Structurally normal valve. Leaflet separation was normal. Doppler: Transvalvular velocity was within the normal range. No regurgitation.  ------------------------------------------------------------ Pulmonary artery: Systolic pressure was within the normal range.  ------------------------------------------------------------ Right atrium: The atrium was normal in size.  ------------------------------------------------------------  2D measurements Normal Doppler Normal Left ventricle measurements LVID ED, 46.7 mm 43-52 Main pulmonary chord, artery PLAX Pressure, S 26 mm =30 LVID ES, 24.4 mm 23-38 Hg chord, Left ventricle PLAX Ea, lat 8.97 cm/ ------- FS, chord, 48 % >29 ann, tiss s PLAX DP LVPW, ED 8.14 mm ------ E/Ea, lat 6.28 ------- IVS/LVPW 1.86 <1.3 ann, tiss ratio, ED DP Ventricular septum Ea, med 5.17 cm/ ------- IVS, ED 15.1 mm ------ ann, tiss s Aorta DP Root diam, 38 mm ------ E/Ea, med 10.89 ------- ED ann, tiss Left atrium DP AP dim 41 mm ------ Mitral valve AP dim 1.81 cm/m^2 <2.2 Peak E vel 56.3 cm/ ------- index s Peak A vel 73.1 cm/ ------- s Deceleratio 430 ms 150-230 n time Peak E/A 0.8 ------- ratio Tricuspid valve Regurg peak  230 cm/ ------- vel s Peak RV-RA 21 mm ------- gradient, S Hg Systemic veins Estimated 5 mm ------- CVP Hg Right ventricle Pressure, S 26 mm <30 Hg Sa  vel, lat 14.8 cm/ ------- ann, tiss s DP  ------------------------------------------------------------ Prepared and Electronically Authenticated by  Croitoru, Mihai 2014-06-17T17:50:43.930      Admission HPI: Patient is a 52 yo M who left AMA on the afternoon of 03/09/13 after being admitted on 03/07/13 for hematemesis due to esophageal varices, for which he is s/p EGD with banding on 03/07/13 by Dr. Ewing Schlein. He returned to the ED on 03/10/13 after a reported episode of syncope. He notes that he was fatigued/lethargic all day, but went outside to water his vegetables in the heat around 4:30p, during which time he began to feel lightheaded & dizzy, and subsequently went back inside. Soon after, he had an episode of non-bloody, food containing emesis (2 ham & cheese sandwiches, lettuce and kool aid earlier), and subsequently reports passing out. Fall was heard by his son, he is not certain if he hit his head. He thinks he was unconscious for 1-2 minutes and was disoriented & nauseous when he awoke. He was not told of any tonic clonic movements, and he denies history of seizures. He denies alcohol use since discharge. He denies vision changes. He reports a similar episode several years ago, that was also precipitated by heat. He denies having had chest pain, palpitations or shortness of breath, but did experience new left arm pain that radiated up his neck along with diaphoresis. He also denies further episodes of hematemesis, and denies BRBPR. He continues to complain of stabbing, RUQ abdominal pain, for which he has taken oxycodone at home.   Hospital Course by problem list:   Syncope--Resolved with no episodes during hospital course. Likely related to volume depletion and orthostatic. Orthostatic vitals were negative but did complain of positional dizziness initially. No history of known autonomic neuropathy.  Echocardiogram showed EF 70-80% with hyperdynamic systolic function and dynamic obstruction at  rest in the outflow tract with mid-cavity obliteration and peak velocity of 214cm/sec, no regional wall motion abnormalities and grade 1 diastolic dysfunction. Discussed with Dr. Royann Shivers on the phone today about echo findings, not too concerning at this time, likely just representing hyperdynamic heart as expected with patient with Cirrhosis. Recommend follow up with PCP and cardiology as needed.      Esophageal varices in cirrhosis--s/p endoscopy 6/14 by Dr. Ewing Schlein with 2 bands placed for distal 1-2+ varices. No active bleeding noted. Hb stable 13 on discharge. Abdominal ultrasound: stable bilateral renal cysts, GB sludge previously seen has resolved, cirrhotic change of liver, and stable splenomegaly. Recommended to continue protonix and propranalol.  Will need to follow up with Dr. Dulce Sellar as an ouptatient.     Thrombocytopenia--Platelets on 03/07/13 were 111 and down to 54 on 03/09/13.  Hx of Cirrhosis.  Platelets have been as low as 59 in May 2014 and as high as 189 in 2011.  No obvious signs of bleeding, s/p banding of varices on 03/07/13 EGD.  No more episodes of hematemesis or dark stools during admissions. INR 1.05.will need repeat cbc on hospital follow up.  Follow up with pcp.    Abdominal pain--improved during admission.  diffuse abdominal pain, localized mainly in epigastric and RUQ regions.  Hx of GB sludge.  GI was following.  Abdominal ultrasound showed no gallstones, GB wall thickening or pericholecystic fluid with resolution of prior GB sludge.  Stable bilateral renal cysts,  cirrhotic change of liver and stable splenomegaly.  No acute abnormality.  Follow up with pcp and GI as needed.       Hypertension--is not on home medication at this time, claims they were stopped by pcp per chart notes. Possibly initially elevated secondary to pain as well. Recommended to follow up with PCP.   Discharge Vitals:   BP 128/74  Pulse 80  Temp(Src) 97.6 F (36.4 C) (Oral)  Resp 18  Ht 6\' 4"  (1.93 m)   Wt 209 lb (94.802 kg)  BMI 25.45 kg/m2  SpO2 99%  Discharge Labs:  Results for orders placed during the hospital encounter of 03/09/13 (from the past 24 hour(s))  URINALYSIS, ROUTINE W REFLEX MICROSCOPIC     Status: Abnormal   Collection Time    03/09/13 11:15 PM      Result Value Range   Color, Urine AMBER (*) YELLOW   APPearance CLEAR  CLEAR   Specific Gravity, Urine 1.021  1.005 - 1.030   pH 6.0  5.0 - 8.0   Glucose, UA NEGATIVE  NEGATIVE mg/dL   Hgb urine dipstick NEGATIVE  NEGATIVE   Bilirubin Urine SMALL (*) NEGATIVE   Ketones, ur NEGATIVE  NEGATIVE mg/dL   Protein, ur NEGATIVE  NEGATIVE mg/dL   Urobilinogen, UA 1.0  0.0 - 1.0 mg/dL   Nitrite NEGATIVE  NEGATIVE   Leukocytes, UA NEGATIVE  NEGATIVE  CBC WITH DIFFERENTIAL     Status: Abnormal   Collection Time    03/09/13 11:39 PM      Result Value Range   WBC 5.8  4.0 - 10.5 K/uL   RBC 4.42  4.22 - 5.81 MIL/uL   Hemoglobin 13.0  13.0 - 17.0 g/dL   HCT 45.4 (*) 09.8 - 11.9 %   MCV 85.7  78.0 - 100.0 fL   MCH 29.4  26.0 - 34.0 pg   MCHC 34.3  30.0 - 36.0 g/dL   RDW 14.7 (*) 82.9 - 56.2 %   Platelets 54 (*) 150 - 400 K/uL   Neutrophils Relative % 57  43 - 77 %   Neutro Abs 3.3  1.7 - 7.7 K/uL   Lymphocytes Relative 29  12 - 46 %   Lymphs Abs 1.7  0.7 - 4.0 K/uL   Monocytes Relative 8  3 - 12 %   Monocytes Absolute 0.5  0.1 - 1.0 K/uL   Eosinophils Relative 6 (*) 0 - 5 %   Eosinophils Absolute 0.3  0.0 - 0.7 K/uL   Basophils Relative 1  0 - 1 %   Basophils Absolute 0.1  0.0 - 0.1 K/uL  COMPREHENSIVE METABOLIC PANEL     Status: Abnormal   Collection Time    03/09/13 11:39 PM      Result Value Range   Sodium 136  135 - 145 mEq/L   Potassium 3.8  3.5 - 5.1 mEq/L   Chloride 102  96 - 112 mEq/L   CO2 25  19 - 32 mEq/L   Glucose, Bld 178 (*) 70 - 99 mg/dL   BUN 16  6 - 23 mg/dL   Creatinine, Ser 1.30  0.50 - 1.35 mg/dL   Calcium 9.2  8.4 - 86.5 mg/dL   Total Protein 7.7  6.0 - 8.3 g/dL   Albumin 3.3 (*) 3.5 - 5.2  g/dL   AST 67 (*) 0 - 37 U/L   ALT 44  0 - 53 U/L   Alkaline Phosphatase 143 (*) 39 - 117 U/L  Total Bilirubin 0.3  0.3 - 1.2 mg/dL   GFR calc non Af Amer >90  >90 mL/min   GFR calc Af Amer >90  >90 mL/min  LIPASE, BLOOD     Status: None   Collection Time    03/09/13 11:39 PM      Result Value Range   Lipase 20  11 - 59 U/L  ETHANOL     Status: None   Collection Time    03/09/13 11:39 PM      Result Value Range   Alcohol, Ethyl (B) <11  0 - 11 mg/dL  AMMONIA     Status: None   Collection Time    03/09/13 11:41 PM      Result Value Range   Ammonia 39  11 - 60 umol/L  POCT I-STAT TROPONIN I     Status: None   Collection Time    03/09/13 11:44 PM      Result Value Range   Troponin i, poc 0.00  0.00 - 0.08 ng/mL   Comment 3           URINE RAPID DRUG SCREEN (HOSP PERFORMED)     Status: Abnormal   Collection Time    03/10/13  3:35 AM      Result Value Range   Opiates NONE DETECTED  NONE DETECTED   Cocaine POSITIVE (*) NONE DETECTED   Benzodiazepines POSITIVE (*) NONE DETECTED   Amphetamines NONE DETECTED  NONE DETECTED   Tetrahydrocannabinol NONE DETECTED  NONE DETECTED   Barbiturates NONE DETECTED  NONE DETECTED  TROPONIN I     Status: None   Collection Time    03/10/13  4:09 AM      Result Value Range   Troponin I <0.30  <0.30 ng/mL  TROPONIN I     Status: None   Collection Time    03/10/13  9:20 AM      Result Value Range   Troponin I <0.30  <0.30 ng/mL    Signed: Darden Palmer, MD 03/10/2013, 11:39 AM   Time Spent on Discharge: 35 minutes Services Ordered on Discharge: none Equipment Ordered on Discharge: none

## 2013-03-10 NOTE — H&P (Signed)
Date: 03/10/2013               Patient Name:  Mark Christensen MRN: [id number]  DOB: 01-01-1961 Age / Sex: 52 y.o., male   PCP: Kaleen Mask, MD         Medical Service: Internal Medicine Teaching Service         Attending Physician: Dr. Aletta Edouard    First Contact: Dr. Virgina Organ Pager: [phone]  Second Contact: Dr. Dorise Hiss Pager: [phone]       After Hours (After 5p/  First Contact Pager: [phone]  weekends / holidays): Second Contact Pager: [phone]   Chief Complaint: Syncope  History of Present Illness:  Patient is a 52 yo M who left AMA on the afternoon of 03/09/13 after being admitted on 03/07/13 for hematemesis due to esophageal varices, for which he is s/p EGD with banding on 03/07/13 by Dr. Ewing Schlein. He returned to the ED on 03/10/13 after a reported episode of syncope.  He notes that he was fatigued/lethargic all day, but went outside to water his vegetables in the heat around 4:30p, during which time he began to feel lightheaded & dizzy, and subsequently went back inside.  Soon after, he had an episode of non-bloody, food containing emesis (2 ham & cheese sandwiches, lettuce and kool aid earlier), and subsequently reports passing out. Fall was heard by his son, he is not certain if he hit his head. He thinks he was unconscious for 1-2 minutes and was disoriented & nauseous when he awoke. He was not told of any tonic clonic movements, and he denies history of seizures. He denies alcohol use since discharge. He denies vision changes. He reports a similar episode several years ago, that was also precipitated by heat.  He denies having had chest pain, palpitations or shortness of breath, but did experience new left arm pain that radiated up his neck along with diaphoresis.  He also denies further episodes of hematemesis, and denies BRBPR.  He continues to complain of stabbing, RUQ abdominal pain, for which he has taken oxycodone at home.   Review of Systems: Constitutional: Denies  fever, chills HEENT: Denies photophobia, eye pain, loss, congestion, sore throat, rhinorrhea, sneezing, mouth sores, trouble swallowing, neck pain, neck stiffness and tinnitus. +left sided hearing diminished, chronic Respiratory: Denies SOB, cough, chest tightness, and wheezing.  Cardiovascular: Denies palpitations and leg swelling.  Gastrointestinal: Denies diarrhea, constipation,blood in stool and abdominal distention.  Genitourinary: Denies dysuria, urgency, frequency, hematuria, flank pain and difficulty urinating.  Musculoskeletal: Denies myalgias, back pain, joint swelling, arthralgias  Skin: Denies pallor, rash and wound.  Neurological: Denies seizures, numbness and headaches.   Meds: Medication Sig  . albuterol (PROVENTIL HFA;VENTOLIN HFA) 108 (90 BASE) MCG/ACT inhaler Inhale 1-2 puffs into the lungs every 4 (four) hours as needed for wheezing. For wheezing  . amitriptyline (ELAVIL) 25 MG tablet Take 25 mg by mouth at bedtime.  . ciprofloxacin (CIPRO) 500 MG tablet Take 1 tablet (500 mg total) by mouth 2 (two) times daily.  Marland Kitchen docusate sodium (COLACE) 100 MG capsule Take 100 mg by mouth 2 (two) times daily as needed for constipation. For constipation  . FLUoxetine (PROZAC) 40 MG capsule Take 40 mg by mouth daily.  . nabumetone (RELAFEN) 500 MG tablet Take 500 mg by mouth 2 (two) times daily.  Marland Kitchen oxyCODONE (OXY IR/ROXICODONE) 5 MG immediate release tablet Take 1 tablet (5 mg total) by mouth every 4 (four) hours as needed for pain. For pain  .  pantoprazole (PROTONIX) 40 MG tablet Take 1 tablet (40 mg total) by mouth daily.  . polyethylene glycol (MIRALAX / GLYCOLAX) packet Take 17 g by mouth daily as needed. For constipation  . promethazine (PHENERGAN) 25 MG tablet Take 25 mg by mouth every 6 (six) hours as needed for nausea.  Marland Kitchen sulfamethoxazole-trimethoprim (SEPTRA DS) 800-160 MG per tablet Take 1 tablet by mouth every 12 (twelve) hours.    Allergies: Allergies as of 03/09/2013 -  Review Complete 03/09/2013  Allergen Reaction Noted  . Tylenol (acetaminophen) Other (See Comments) 12/06/2011  . Asa (aspirin) Other (See Comments) 02/02/2013  . Morphine and related Itching 11/09/2011  . Tramadol Nausea And Vomiting and Swelling 06/29/2012   Past Medical History  Diagnosis Date  . Hepatic cirrhosis   . Hypertension     NO LONGER TAKING B/P MEDS--STATES DISCONTINUED BY HIS DOCTOR  . Anxiety   . Shortness of breath   . GERD (gastroesophageal reflux disease)   . Restless leg syndrome   . Complication of anesthesia     ENDO/COLONOSCOPY ATTEMPTED IN THE PAST-UNABLE TO ADEQUATELY SEDATE PT-TOLD WOULD NEED ANESTHESIA FOR FUTURE PROCEDURES  . Pneumonia 09-2012  . Headache(784.0)     HX OF MIGRAINES  . Arthritis     hands  . Hepatitis B 01-30-13    liver enzymes remains elevated  . Hepatitis C   . Wound healing, delayed 01-30-13    3-22-'14 s/p I&D right ankle surgery(infection) -minimal drainage with dressing  . 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 Appendectomy    . Esophageal banding      FOR TX OF GI BLEED ABOUT 2008  . Arm debridement  08-11-12    right forearm wound with debridement 2 months ago/ right abdomen also-healing well  . Multiple tooth extractions  08/12/2012  . Colonoscopy with propofol  08/13/2012    Procedure: COLONOSCOPY WITH PROPOFOL;  Surgeon: Willis Modena, MD;  Location: [facility] ENDOSCOPY;  Service: Endoscopy;  Laterality: N/A;  . 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 Esophagogastroduodenoscopy N/A 11/18/2012    Procedure: ESOPHAGOGASTRODUODENOSCOPY (EGD);  Surgeon: Shirley Friar, MD;  Location: [facility] ENDOSCOPY;  Service: Endoscopy;  Laterality: N/A;  . Esophageal banding  11/18/2012    Procedure: ESOPHAGEAL BANDING;  Surgeon: Shirley Friar, MD;  Location: [facility] ENDOSCOPY;  Service: Endoscopy;;  . I&d extremity Right 12/13/2012    Procedure:  IRRIGATION AND DEBRIDEMENT EXTREMITY;  Surgeon: Shelda Pal, MD;  Location: [facility] OR;  Service: Orthopedics;  Laterality: Right;  . Ankle surgery      Debridement of ankle 12-13-12-[place]/ Dr. Ashley Royalty  . Peripherally inserted central catheter insertion      insertion and removal(IVR)  . Esophagogastroduodenoscopy N/A 02/03/2013    Procedure: ESOPHAGOGASTRODUODENOSCOPY (EGD);  Surgeon: Shirley Friar, MD;  Location: [facility] ENDOSCOPY;  Service: Endoscopy;  Laterality: N/A;  . Esophagogastroduodenoscopy N/A 03/07/2013    Procedure: ESOPHAGOGASTRODUODENOSCOPY (EGD);  Surgeon: Petra Kuba, MD;  Location: [facility] ENDOSCOPY;  Service: Endoscopy;  Laterality: N/A;  . Esophageal banding  03/07/2013    Procedure: ESOPHAGEAL BANDING;  Surgeon: Petra Kuba, MD;  Location: [facility] ENDOSCOPY;  Service: Endoscopy;;   No family history on file.  History   Social History  . Marital Status: Married    Spouse Name: N/A    Number of Children: N/A  . Years of Education: N/A  Occupational History  . Not on file.   Social History Main Topics  . Smoking status: Current Every Day Smoker -- 0.50 packs/day for 30 years    Types: Cigarettes  . Smokeless tobacco: Never Used  . Alcohol Use: No     Comment: RARELY USES ALCOHOL  . Drug Use: No     Comment: HX OF POSITIVE URINE DRUG SCREEN FOR COCAINE--PT DENIES USE OF COCAINE  . Sexually Active: Yes   Other Topics Concern  . Not on file   Social History Narrative  . No narrative on file    Physical Exam: Blood pressure 127/90, pulse 63, temperature 97.6 F (36.4 C), resp. rate 16, SpO2 95.00%. on RA General: resting in bed, no acute distress HEENT: Left pupil round, reactive; right eye enucleation, no scleral icterus Cardiac: RRR, no rubs, murmurs or gallops Pulm: clear to auscultation bilaterally, moving normal volumes of air Abd: soft, nontender (even to deep palpation with stethoscope), nondistended, BS normoactive Ext: warm and well perfused, no  pedal edema Neuro: alert and oriented X3, cranial nerves II-XII grossly intact, b/l UE & LE strength 5/5, sensation grossly intact, normal finger to nose, normal rapid alternating hand mvmts, normal romberg (with subjective dizziness on standing), no pronator drift, cautious gait but no abnormal steppage  Orthostatic Vital Signs at 0045 on 03/10/13 (s/p 500 cc bolus at 23:42) Supine Sitting  Standing  121/75 133/80 130/89  65 64 68    Lab results: Basic Metabolic Panel:  Recent Labs  47/82/95 0428 03/09/13 2339  NA 136 136  K 3.8 3.8  CL 101 102  CO2 25 25  GLUCOSE 118* 178*  BUN 13 16  CREATININE 0.96 0.95  CALCIUM 9.0 9.2   Liver Function Tests:  Recent Labs  03/09/13 0428 03/09/13 2339  AST 32 67*  ALT 31 44  ALKPHOS 128* 143*  BILITOT 0.4 0.3  PROT 7.4 7.7  ALBUMIN 3.0* 3.3*    Recent Labs  03/07/13 1008 03/09/13 2339  LIPASE 23 20    Recent Labs  03/09/13 2341  AMMONIA 39   CBC:  Recent Labs  03/07/13 1008  03/09/13 0715 03/09/13 2339  WBC 7.1  < > 5.1 5.8  NEUTROABS 4.4  --   --  3.3  HGB 13.9  < > 13.7 13.0  HCT 40.3  < > 39.8 37.9*  MCV 84.7  < > 85.2 85.7  PLT 111*  < > 83* 54*  < > = values in this interval not displayed.  Cardiac Enzymes: pending  Urine Drug Screen: pending  Alcohol Level:  Recent Labs  03/09/13 2339  ETH <11   Urinalysis:  Recent Labs  03/09/13 2315  COLORURINE AMBER*  LABSPEC 1.021  PHURINE 6.0  GLUCOSEU NEGATIVE  HGBUR NEGATIVE  BILIRUBINUR SMALL*  KETONESUR NEGATIVE  PROTEINUR NEGATIVE  UROBILINOGEN 1.0  NITRITE NEGATIVE  LEUKOCYTESUR NEGATIVE    Imaging results: No new imaging.  Other results: EKG: sinus rhythm, HR 64, normal axis, QTc 472, no ST changes  Assessment & Plan by Problem: Principal Problem:   Syncope Active Problems:   Cirrhosis of liver   Hypertension   Abdominal pain   Esophageal varices in cirrhosis   Thrombocytopenia  53 yo M who left AMA on the afternoon of  03/09/13 after being admitted on 03/07/13 for hematemesis due to esophageal varices, for which he is s/p EGD with banding on 03/07/13 by Dr. Ewing Schlein.  He returned to the ED on 03/10/13 after a reported episode  of syncope.   #Syncope:Likely related to volume depletion and orthostatic. Orthostatic vitals are negative but he continues to complain of positional dizziness. No history of known autonomic neuropathy. ACS unlikely (TIMI score = 0 ) with normal EKG and negative initial troponin. He has no history of cardiac arrhythmias. Also presentation not typical of vasovagal syncope. Geneva score is 0 which makes PE unlikely (7-9 % pretest probability). Neurologic exam unlike with this presentation and a non-focal exam. In the ED he had received a 500 cc of NS bolus but was still complaining of dizziness on standing up. Repeat orthostatics negative.  Plan  - Admit to telemetry  - IV NS 50 cc/hr  for the next 10 hrs - Cycle CEs X3  - NPO tonight for abdominal US - AM EKG - No BB due to HR of 64, no ASA due to recent GIB, no start given hight LFTs. Will recess - Clinical monitoring for neurologic finding and consider head CT.   # Esophageal varices - s/p endoscopy 6/14 by Dr. Ewing Schlein with 2 bands placed for distal 1-2+ varices. No active bleeding noted. Hb stable. His current syncope is not due to GI bleeding. Plan  - restart PO protonix 40mg  bid  - restart propranolol 10mg  tid  - restart cont rocephin for SBP ppx  - currently NPO for abdominal u/s scan in AM   # Liver cirrhosis - Secondary to previous EtOH abuse, Hep B, and Hep C. Pt follows with Dr. Dulce Sellar of [facility]. Albumin = 3.3. INR = 1.05.  -to follow up with Dr. Dulce Sellar as outpatient  #Thrombocytopenia: Plt count 111>>>>54. Likely due to liver cirrhosis. S/E of Octreotide in <1%. No active bleeding.  Continue to monitor this.   #History of restless leg syndrome/ anxiety: He amitriptyline and Prozac. Will continue with his home meds.   Dispo:  Disposition is deferred at this time, awaiting improvement of current medical problems. Anticipated discharge in approximately 1-2 day(s).   The patient does have a current PCP Andrey Campanile Elizebeth Koller, MD) and does not need an [facility] hospital follow-up appointment after discharge.  The patient does not have transportation limitations that hinder transportation to clinic appointments.  Signed:  Signed:  Dow Adolph, MD PGY-1 Internal Medicine Teaching Service Pager: [phone] (7pm-7am) 03/10/2013, 4:07 AM

## 2013-03-10 NOTE — ED Notes (Signed)
Admitting resident MDs at bedside.

## 2013-03-10 NOTE — Progress Notes (Signed)
Pt weaned down to RA, 02 sats sustaining at 94%. Will continue to monitor.

## 2013-03-10 NOTE — Discharge Summary (Signed)
The patient left against medical advice. I agree with the documentation below. Please see my note from today for my plan for the patient, which could not be effected because he left.

## 2013-03-11 MED ORDER — PROPRANOLOL HCL 10 MG PO TABS: 10.0000 mg | ORAL_TABLET | Freq: Three times a day (TID) | ORAL | Status: AC

## 2013-03-11 NOTE — Progress Notes (Addendum)
Subjective: Mark Christensen was seen and examined at bedside this morning.  He claims to be feeling much better this morning, no abdominal pain at this time and ready to go home.  We discussed the need for him to follow up with his PCP and GI as an outpatient and to avoid alcohol and drugs (UDS positive for cocaine and benzodiazepines although he denies use), and to quit smoking.  He denies any headache, chest pain, N/V/D, fever, chills, abdominal pain, or any urinary complaints at this time.    Objective: Vital signs in last 24 hours: Filed Vitals:   03/10/13 1307 03/10/13 1722 03/10/13 2045 03/11/13 0448  BP: 132/84 129/72 125/75 127/81  Pulse: 67 82 79 81  Temp: 97.8 F (36.6 C)  98.4 F (36.9 C) 97.5 F (36.4 C)  TempSrc: Oral  Oral Oral  Resp: 18  18 19   Height:      Weight:    210 lb 1.6 oz (95.3 kg)  SpO2: 100%  98% 99%   Weight change: 1 lb 1.6 oz (0.498 kg)  Intake/Output Summary (Last 24 hours) at 03/11/13 1139 Last data filed at 03/11/13 0730  Gross per 24 hour  Intake    480 ml  Output   1875 ml  Net  -1395 ml   Vitals reviewed. General: resting in bed, NAD HEENT: PERRL, EOMI Cardiac: RRR, no rubs, murmurs or gallops Pulm: clear to auscultation bilaterally, no wheezes, rales, or rhonchi Abd: soft, nontender, nondistended, BS present Ext: warm and well perfused, no pedal edema Neuro: alert and oriented X3, cranial nerves II-XII grossly intact, strength and sensation to light touch equal in bilateral upper and lower extremities  Lab Results: Basic Metabolic Panel:  Recent Labs Lab 03/09/13 0428 03/09/13 2339  NA 136 136  K 3.8 3.8  CL 101 102  CO2 25 25  GLUCOSE 118* 178*  BUN 13 16  CREATININE 0.96 0.95  CALCIUM 9.0 9.2   Liver Function Tests:  Recent Labs Lab 03/09/13 0428 03/09/13 2339  AST 32 67*  ALT 31 44  ALKPHOS 128* 143*  BILITOT 0.4 0.3  PROT 7.4 7.7  ALBUMIN 3.0* 3.3*    Recent Labs Lab 03/07/13 1008 03/09/13 2339  LIPASE 23  20    Recent Labs Lab 03/09/13 2341  AMMONIA 39   CBC:  Recent Labs Lab 03/07/13 1008  03/09/13 0715 03/09/13 2339  WBC 7.1  < > 5.1 5.8  NEUTROABS 4.4  --   --  3.3  HGB 13.9  < > 13.7 13.0  HCT 40.3  < > 39.8 37.9*  MCV 84.7  < > 85.2 85.7  PLT 111*  < > 83* 54*  < > = values in this interval not displayed. Cardiac Enzymes:  Recent Labs Lab 03/10/13 0409 03/10/13 0920 03/10/13 1550  TROPONINI <0.30 <0.30 <0.30   Coagulation:  Recent Labs Lab 03/07/13 1008  LABPROT 13.6  INR 1.05   Urine Drug Screen: Drugs of Abuse     Component Value Date/Time   LABOPIA NONE DETECTED 03/10/2013 0335   LABOPIA  Value: POSITIVE (NOTE) Result repeated and verified. Sent for confirmatory testing* 06/05/2010 0629   COCAINSCRNUR POSITIVE* 03/10/2013 0335   COCAINSCRNUR NEGATIVE 06/05/2010 0629   LABBENZ POSITIVE* 03/10/2013 0335   LABBENZ  Value: POSITIVE (NOTE) Result repeated and verified. Sent for confirmatory testing* 06/05/2010 0629   AMPHETMU NONE DETECTED 03/10/2013 0335   AMPHETMU NEGATIVE 06/05/2010 0629   THCU NONE DETECTED 03/10/2013 0335   LABBARB  NONE DETECTED 03/10/2013 0335    Alcohol Level:  Recent Labs Lab 03/09/13 2339  ETH <11   Urinalysis:  Recent Labs Lab 03/09/13 2315  COLORURINE AMBER*  LABSPEC 1.021  PHURINE 6.0  GLUCOSEU NEGATIVE  HGBUR NEGATIVE  BILIRUBINUR SMALL*  KETONESUR NEGATIVE  PROTEINUR NEGATIVE  UROBILINOGEN 1.0  NITRITE NEGATIVE  LEUKOCYTESUR NEGATIVE   Micro Results: Recent Results (from the past 240 hour(s))  MRSA PCR SCREENING     Status: None   Collection Time    03/07/13  3:49 PM      Result Value Range Status   MRSA by PCR NEGATIVE  NEGATIVE Final   Comment:            The GeneXpert MRSA Assay (FDA     approved for NASAL specimens     only), is one component of a     comprehensive MRSA colonization     surveillance program. It is not     intended to diagnose MRSA     infection nor to guide or     monitor treatment  for     MRSA infections.   Studies/Results: US Abdomen Complete  03/10/2013   *RADIOLOGY REPORT*  Clinical Data:  Abdominal pain  ABDOMINAL ULTRASOUND COMPLETE  Comparison:  02/04/2013  Findings:  Gallbladder:  No gallstones, gallbladder wall thickening, or pericholecystic fluid. The previously seen gallbladder sludge is no longer identified.  Common Bile Duct:  7.2 mm.  This is mildly prominent for the patient's age.  No intrahepatic biliary ductal dilatation is seen.  Liver: Coarsely echogenic with mild nodularity consistent with cirrhotic changes.  This is stable from prior exam.  IVC:  Appears normal.  Pancreas:  No abnormality identified.  Spleen:  Persistent splenomegaly.  The length is 14.1 cm.  Right kidney:  12 cm in length.  No hydronephrosis or calculi are identified. 12.6 cm cyst is noted in the upper pole of the right kidney.  It is stable in appearance but has a more simple appearance on the current examination.  Left kidney:  12.2 cm in length.  No hydronephrosis or calculi are seen. 1.9 cm cyst in the mid to upper pole of the left kidney.  A few small septations are noted within.  Abdominal Aorta:  No aneurysm identified.  IMPRESSION: Stable bilateral renal cysts.  The gallbladder sludge seen previously has resolved in the interval.  Cirrhotic change of the liver.  Stable splenomegaly.  No acute abnormality is noted.   Original Report Authenticated By: Alcide Clever, M.D.   Medications: I have reviewed the patient's current medications. Scheduled Meds: . amitriptyline  25 mg Oral QHS  . cefTRIAXone (ROCEPHIN)  IV  1 g Intravenous Q24H  . FLUoxetine  40 mg Oral Daily  . nicotine  14 mg Transdermal Daily  . pantoprazole  40 mg Oral BID  . propranolol  10 mg Oral TID  . sodium chloride  3 mL Intravenous Q12H   Continuous Infusions:  PRN Meds:.albuterol, ondansetron, oxyCODONE Assessment/Plan: Mark Christensen is a 52 yo M who left AMA on the afternoon of 03/09/13 after being admitted on  03/07/13 for hematemesis due to esophageal varices, for which he is s/p EGD with banding on 03/07/13 by Dr. Ewing Schlein. He returned to the ED on 03/10/13 after a reported episode of syncope witnessed by son.   #Syncope: Resolved.  No more episodes during hospital course.  Likely related to volume depletion and orthostatic. Orthostatic vitals were negative but did complain of positional dizziness  initially. No history of known autonomic neuropathy. ACS unlikely (TIMI score = 0 ) with normal EKG and negative cardiac enzymes. He has no history of cardiac arrhythmias. Also presentation not typical of vasovagal syncope. Geneva score is 0 which makes PE unlikely (7-9 % pretest probability). Neurologic exam unlikely with this presentation and a non-focal exam. In the ED he had received a 500 cc of NS bolus but was still complaining of dizziness on standing up. Repeat orthostatics negative.  Echocardiogram showed EF 70-80% with hyperdynamic systolic function and dynamic obstruction at rest in the outflow tract with mid-cavity obliteration and peak velocity of 214cm/sec, no regional wall motion abnormalities and grade 1 diastolic dysfunction.    Discussed with Dr. Royann Shivers on the phone today about echo findings, not too concerning at this time, likely just representing hyperdynamic heart as expected with patient with Cirrhosis.   -recommend follow up with PCP and cardiology for possible event monitoring as an outpatient.    # Esophageal varices - s/p endoscopy 6/14 by Dr. Ewing Schlein with 2 bands placed for distal 1-2+ varices. No active bleeding noted. Hb stable ~13.  Abdominal ultrasound: stable bilateral renal cysts, GB sludge previously seen has resolved, cirrhotic change of liver, and stable splenomegaly.  Will need to follow up with Dr. Dulce Sellar as an ouptatient.   - continue protonix and propranalol.    # Liver cirrhosis - Secondary to previous EtOH abuse, Hep B, and Hep C. Pt follows with Dr. Dulce Sellar of [facility]. Albumin =  3.3. INR = 1.05. UDS positive for cocaine and benzodiazepines although patient denies.  -to follow up with Dr. Dulce Sellar as outpatient   #Thrombocytopenia: Plt count 111>>>>54. Likely due to liver cirrhosis. S/E of Octreotide in <1%. No active bleeding at this time.  INR 1.05.  Platelets have been as high has 189 in 2011.  Platelets in 2011 mostly less than 11.  Noted to go down to 59 in May 2014.   -continue to monitor and follow up wit PCP  #History of restless leg syndrome/ anxiety: He amitriptyline and Prozac.  -Continue with his home meds.  Dispo: d/c home today  The patient does have a current PCP Andrey Campanile Elizebeth Koller, MD) and does not need an [facility] hospital follow-up appointment after discharge.   The patient does not have transportation limitations that hinder transportation to clinic appointments.  Services Needed at time of discharge: Y = Yes, Blank = No PT:   OT:   RN:   Equipment:   Other:     LOS: 2 days   Darden Palmer, MD 03/11/2013, 11:39 AM

## 2013-03-11 NOTE — Progress Notes (Signed)
Nursing Note: Reviewed patient discharge paperwork. Explained to pick up propanolol and stop taking Cipro, etc. Discontinued IV after IV dose of Rocephin. Patient got dressed and walked out even though I asked him to wait for a wheelchair. Lajuana Matte, RN

## 2013-03-11 NOTE — Care Management Note (Unsigned)
    Page 1 of 1   03/11/2013     4:50:28 PM   CARE MANAGEMENT NOTE 03/11/2013  Patient:  Mark Christensen, Mark Christensen   Account Number:  [id number]  Date Initiated:  03/11/2013  Documentation initiated by:  Marli Diego  Subjective/Objective Assessment:   PT ADM ON 6/16 WITH HEMATEMESIS, BLOODY STOOLS AND SYNCOPE. PTA, PT LIVES AT HOME WITH SON.     Action/Plan:   WILL FOLLOW FOR HOME NEEDS AS PT PROGRESSES.   Anticipated DC Date:  03/11/2013   Anticipated DC Plan:  HOME/SELF CARE      DC Planning Services  CM consult      Choice offered to / List presented to:             Status of service:  Completed, signed off Medicare Important Message given?   (If response is "NO", the following Medicare IM given date fields will be blank) Date Medicare IM given:   Date Additional Medicare IM given:    Discharge Disposition:  HOME/SELF CARE  Per UR Regulation:  Reviewed for med. necessity/level of care/duration of stay  If discussed at Long Length of Stay Meetings, dates discussed:    Comments:

## 2013-03-11 NOTE — Progress Notes (Signed)
Interval Progress Note:  Discussed with RN this morning that patient will be discharged after rounds and I canceled the discharge order while patient was still on the floor.  Patient was discharged by RN despite the canceled discharge order.  Discussed with RN, Norwood Levo, claims patient was in a hurry to leave, refused to wait for wheelchair and walked off.  Also discussed with Charge nurse, Kim about the patient being discharged off the floor.  I then called patient who has arrived safely back home.  I discussed the discharge and his follow up appointments.  I have scheduled him to see his PCP Dr. Jeannetta Nap on June 25th, at 10am and have also scheduled a follow up appointment with Dr. Dulce Sellar for April 03, 2013 at 915am but may get moved up pending Dr. Hulen Shouts schedule.  I also discussed with him that due to his syncope, he may benefit from cardiology outpatient follow up and to discuss with PCP as I will also include details in discharge summary.  He was given the number for [facility] cardiology as well to try to set up a follow up appointment with discussion with his pcp.  He acknowledged understanding of our discussion, wrote down his appointments and relayed dates back to me, and plans to follow up with them.  He asked if he was given something for pain, and I said his home medications were continued with also need for him to take protonix as well and to discuss further with PCP as needed and he agrees.  These details have also been discussed with attending, Dr. Dalphine Handing.  Signed: Darden Palmer, MD PGY-I, Internal Medicine Resident Pager: [phone]  03/11/2013,12:23 PM

## 2013-03-15 NOTE — Discharge Summary (Signed)
Internal Medicine Teaching Service Attending Note Date: 03/15/2013  Patient name: Mark Christensen  Medical record number: [id number]  Date of birth: Aug 12, 1961    I evaluated the patient on the day of discharge and discussed the discharge plan with my resident team. I agree with the discharge documentation and disposition.     Thanks Aletta Edouard 03/15/2013, 12:11 PM

## 2013-03-18 ENCOUNTER — Encounter (HOSPITAL_COMMUNITY): Admission: RE | Admit: 2013-03-18 | Payer: Medicaid Other | Source: Ambulatory Visit

## 2013-03-20 ENCOUNTER — Encounter (HOSPITAL_COMMUNITY): Payer: Medicaid Other

## 2013-03-24 ENCOUNTER — Encounter (HOSPITAL_COMMUNITY): Admission: RE | Admit: 2013-03-24 | Payer: Medicaid Other | Source: Ambulatory Visit

## 2013-03-26 ENCOUNTER — Ambulatory Visit (INDEPENDENT_AMBULATORY_CARE_PROVIDER_SITE_OTHER): Payer: Self-pay | Admitting: General Surgery

## 2013-04-02 ENCOUNTER — Other Ambulatory Visit (HOSPITAL_COMMUNITY): Payer: Self-pay | Admitting: Internal Medicine

## 2013-04-06 ENCOUNTER — Encounter (INDEPENDENT_AMBULATORY_CARE_PROVIDER_SITE_OTHER): Payer: Self-pay | Admitting: General Surgery

## 2013-04-06 ENCOUNTER — Emergency Department (HOSPITAL_COMMUNITY)
Admission: EM | Admit: 2013-04-06 | Discharge: 2013-04-07 | Disposition: A | Discharge status: Home / Self Care | Payer: Medicaid Other | Attending: Emergency Medicine | Admitting: Emergency Medicine

## 2013-04-06 ENCOUNTER — Encounter (HOSPITAL_COMMUNITY): Payer: Self-pay | Admitting: Emergency Medicine

## 2013-04-06 ENCOUNTER — Emergency Department (HOSPITAL_COMMUNITY): Payer: Medicaid Other

## 2013-04-06 DIAGNOSIS — B1920 Unspecified viral hepatitis C without hepatic coma: Secondary | ICD-10-CM | POA: Insufficient documentation

## 2013-04-06 DIAGNOSIS — B1910 Unspecified viral hepatitis B without hepatic coma: Secondary | ICD-10-CM | POA: Insufficient documentation

## 2013-04-06 DIAGNOSIS — R748 Abnormal levels of other serum enzymes: Secondary | ICD-10-CM | POA: Insufficient documentation

## 2013-04-06 DIAGNOSIS — Z79899 Other long term (current) drug therapy: Secondary | ICD-10-CM | POA: Insufficient documentation

## 2013-04-06 DIAGNOSIS — Z885 Allergy status to narcotic agent status: Secondary | ICD-10-CM | POA: Insufficient documentation

## 2013-04-06 DIAGNOSIS — G2581 Restless legs syndrome: Secondary | ICD-10-CM | POA: Insufficient documentation

## 2013-04-06 DIAGNOSIS — K219 Gastro-esophageal reflux disease without esophagitis: Secondary | ICD-10-CM | POA: Insufficient documentation

## 2013-04-06 DIAGNOSIS — M6281 Muscle weakness (generalized): Secondary | ICD-10-CM | POA: Insufficient documentation

## 2013-04-06 DIAGNOSIS — Z888 Allergy status to other drugs, medicaments and biological substances status: Secondary | ICD-10-CM | POA: Insufficient documentation

## 2013-04-06 DIAGNOSIS — F411 Generalized anxiety disorder: Secondary | ICD-10-CM | POA: Insufficient documentation

## 2013-04-06 DIAGNOSIS — F05 Delirium due to known physiological condition: Secondary | ICD-10-CM | POA: Insufficient documentation

## 2013-04-06 DIAGNOSIS — R4789 Other speech disturbances: Secondary | ICD-10-CM | POA: Insufficient documentation

## 2013-04-06 DIAGNOSIS — K7460 Unspecified cirrhosis of liver: Secondary | ICD-10-CM | POA: Insufficient documentation

## 2013-04-06 DIAGNOSIS — I10 Essential (primary) hypertension: Secondary | ICD-10-CM | POA: Insufficient documentation

## 2013-04-06 DIAGNOSIS — M129 Arthropathy, unspecified: Secondary | ICD-10-CM | POA: Insufficient documentation

## 2013-04-06 DIAGNOSIS — Z8669 Personal history of other diseases of the nervous system and sense organs: Secondary | ICD-10-CM | POA: Insufficient documentation

## 2013-04-06 DIAGNOSIS — R42 Dizziness and giddiness: Secondary | ICD-10-CM | POA: Insufficient documentation

## 2013-04-06 DIAGNOSIS — R109 Unspecified abdominal pain: Secondary | ICD-10-CM | POA: Insufficient documentation

## 2013-04-06 DIAGNOSIS — F17200 Nicotine dependence, unspecified, uncomplicated: Secondary | ICD-10-CM | POA: Insufficient documentation

## 2013-04-06 DIAGNOSIS — R4182 Altered mental status, unspecified: Secondary | ICD-10-CM | POA: Insufficient documentation

## 2013-04-06 DIAGNOSIS — Z8701 Personal history of pneumonia (recurrent): Secondary | ICD-10-CM | POA: Insufficient documentation

## 2013-04-06 LAB — COMPREHENSIVE METABOLIC PANEL
ALT: 85 U/L — ABNORMAL HIGH (ref 0–53)
Alkaline Phosphatase: 132 U/L — ABNORMAL HIGH (ref 39–117)
BUN: 14 mg/dL (ref 6–23)
CO2: 23 mEq/L (ref 19–32)
Calcium: 10 mg/dL (ref 8.4–10.5)
GFR calc Af Amer: >90 mL/min (ref >90)
GFR calc non Af Amer: >90 mL/min (ref >90)
Glucose, Bld: 99 mg/dL (ref 70–99)
Potassium: 3.9 mEq/L (ref 3.5–5.1)
Sodium: 135 mEq/L (ref 135–145)

## 2013-04-06 LAB — CBC WITH DIFFERENTIAL/PLATELET
Eosinophils Relative: 3 % (ref 0–5)
Hemoglobin: 13.3 g/dL (ref 13.0–17.0)
Lymphocytes Relative: 24 % (ref 12–46)
Lymphs Abs: 1.3 10*3/uL (ref 0.7–4.0)
MCH: 29.6 pg (ref 26.0–34.0)
MCV: 85.1 fL (ref 78.0–100.0)
Monocytes Relative: 14 % — ABNORMAL HIGH (ref 3–12)
Platelets: 77 10*3/uL — ABNORMAL LOW (ref 150–400)
RBC: 4.5 MIL/uL (ref 4.22–5.81)
WBC: 5.4 10*3/uL (ref 4.0–10.5)

## 2013-04-06 LAB — URINALYSIS, ROUTINE W REFLEX MICROSCOPIC
Glucose, UA: NEGATIVE mg/dL
Hgb urine dipstick: NEGATIVE
Ketones, ur: NEGATIVE mg/dL
Protein, ur: NEGATIVE mg/dL
Urobilinogen, UA: 1 mg/dL (ref 0.0–1.0)

## 2013-04-06 LAB — PROTIME-INR: Prothrombin Time: 14.5 seconds (ref 11.6–15.2)

## 2013-04-06 NOTE — ED Notes (Signed)
Pt requesting pain medicine, MD aware

## 2013-04-06 NOTE — ED Provider Notes (Signed)
History    CSN: [id number] Arrival date & time 04/06/13  1324  First MD Initiated Contact with Patient 04/06/13 1925     No chief complaint on file.  (Consider location/radiation/quality/duration/timing/severity/associated sxs/prior Treatment) HPI Comments: Patient got to the ER from home via ambulance. Patient reports that he has been weak, dizzy and disoriented. Patient reports that symptoms began earlier today. He does have a history of liver cirrhosis. Family reports that the last time he behaved like this he had elevated ammonia.  Patient reports that he has gallstones and is being scheduled for surgery. He has had increased pain in the right side of his abdomen today. No vomiting or fever.  Past Medical History  Diagnosis Date  . Hepatic cirrhosis   . Hypertension     NO LONGER TAKING B/P MEDS--STATES DISCONTINUED BY HIS DOCTOR  . Anxiety   . Shortness of breath   . GERD (gastroesophageal reflux disease)   . Restless leg syndrome   . Complication of anesthesia     ENDO/COLONOSCOPY ATTEMPTED IN THE PAST-UNABLE TO ADEQUATELY SEDATE PT-TOLD WOULD NEED ANESTHESIA FOR FUTURE PROCEDURES  . Pneumonia 09-2012  . Headache(784.0)     HX OF MIGRAINES  . Arthritis     hands  . Hepatitis B 01-30-13    liver enzymes remains elevated  . Hepatitis C   . Wound healing, delayed 01-30-13    3-22-'14 s/p I&D right ankle surgery(infection) -minimal drainage with dressing  . 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 Syncope 03/09/2013   Past Surgical History  Procedure Laterality Date  . Appendectomy    . Esophageal banding      FOR TX OF GI BLEED ABOUT 2008  . Arm debridement  08-11-12    right forearm wound with debridement 2 months ago/ right abdomen also-healing well  . Multiple tooth extractions  08/12/2012  . Colonoscopy with propofol  08/13/2012    Procedure: COLONOSCOPY WITH PROPOFOL;  Surgeon: Willis Modena, MD;  Location: [facility] ENDOSCOPY;  Service: Endoscopy;   Laterality: N/A;  . 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 Esophagogastroduodenoscopy N/A 11/18/2012    Procedure: ESOPHAGOGASTRODUODENOSCOPY (EGD);  Surgeon: Shirley Friar, MD;  Location: [facility] ENDOSCOPY;  Service: Endoscopy;  Laterality: N/A;  . Esophageal banding  11/18/2012    Procedure: ESOPHAGEAL BANDING;  Surgeon: Shirley Friar, MD;  Location: [facility] ENDOSCOPY;  Service: Endoscopy;;  . I&d extremity Right 12/13/2012    Procedure: IRRIGATION AND DEBRIDEMENT EXTREMITY;  Surgeon: Shelda Pal, MD;  Location: [facility] OR;  Service: Orthopedics;  Laterality: Right;  . Ankle surgery      Debridement of ankle 12-13-12-[place]/ Dr. Ashley Royalty  . Peripherally inserted central catheter insertion      insertion and removal(IVR)  . Esophagogastroduodenoscopy N/A 02/03/2013    Procedure: ESOPHAGOGASTRODUODENOSCOPY (EGD);  Surgeon: Shirley Friar, MD;  Location: [facility] ENDOSCOPY;  Service: Endoscopy;  Laterality: N/A;  . Esophagogastroduodenoscopy N/A 03/07/2013    Procedure: ESOPHAGOGASTRODUODENOSCOPY (EGD);  Surgeon: Petra Kuba, MD;  Location: [facility] ENDOSCOPY;  Service: Endoscopy;  Laterality: N/A;  . Esophageal banding  03/07/2013    Procedure: ESOPHAGEAL BANDING;  Surgeon: Petra Kuba, MD;  Location: [facility] ENDOSCOPY;  Service: Endoscopy;;   No family history on file. History  Substance Use Topics  . Smoking status: Current Every Day Smoker -- 0.50 packs/day for 30 years    Types: Cigarettes  . Smokeless tobacco: Never  Used  . Alcohol Use: No     Comment: RARELY USES ALCOHOL    Review of Systems  Constitutional: Negative for fever.  Gastrointestinal: Positive for abdominal pain.  Neurological: Positive for dizziness and weakness.  Psychiatric/Behavioral: Positive for confusion.  All other systems reviewed and are negative.    Allergies  Tylenol; Asa; Morphine and related; and Tramadol  Home Medications   Current Outpatient Rx  Name  Route  Sig  Dispense   Refill  . albuterol (PROVENTIL HFA;VENTOLIN HFA) 108 (90 BASE) MCG/ACT inhaler   Inhalation   Inhale 1-2 puffs into the lungs every 4 (four) hours as needed for wheezing. For wheezing         . amitriptyline (ELAVIL) 25 MG tablet   Oral   Take 25 mg by mouth at bedtime.         . docusate sodium (COLACE) 100 MG capsule   Oral   Take 100 mg by mouth 2 (two) times daily as needed for constipation. For constipation         . FLUoxetine (PROZAC) 40 MG capsule   Oral   Take 40 mg by mouth daily.         Marland Kitchen oxyCODONE (OXY IR/ROXICODONE) 5 MG immediate release tablet   Oral   Take 1 tablet (5 mg total) by mouth every 4 (four) hours as needed for pain. For pain   30 tablet   0   . pantoprazole (PROTONIX) 40 MG tablet   Oral   Take 1 tablet (40 mg total) by mouth daily.   30 tablet   11   . polyethylene glycol (MIRALAX / GLYCOLAX) packet   Oral   Take 17 g by mouth daily as needed. For constipation         . promethazine (PHENERGAN) 25 MG tablet   Oral   Take 25 mg by mouth every 6 (six) hours as needed for nausea.         . propranolol (INDERAL) 10 MG tablet   Oral   Take 1 tablet (10 mg total) by mouth 3 (three) times daily.   90 tablet   0    There were no vitals taken for this visit. Physical Exam  Constitutional: He is oriented to person, place, and time. He appears well-developed and well-nourished. No distress.  HENT:  Head: Normocephalic and atraumatic.  Right Ear: Hearing normal.  Left Ear: Hearing normal.  Nose: Nose normal.  Mouth/Throat: Oropharynx is clear and moist and mucous membranes are normal.  Eyes: Conjunctivae and EOM are normal. Pupils are equal, round, and reactive to light.  Neck: Normal range of motion. Neck supple.  Cardiovascular: Regular rhythm, S1 normal and S2 normal.  Exam reveals no gallop and no friction rub.   No murmur heard. Pulmonary/Chest: Effort normal and breath sounds normal. No respiratory distress. He exhibits no  tenderness.  Abdominal: Soft. Normal appearance and bowel sounds are normal. There is no hepatosplenomegaly. There is no tenderness. There is no rebound, no guarding, no tenderness at McBurney's point and negative Murphy's sign. No hernia.  Musculoskeletal: Normal range of motion.  Neurological: He is alert and oriented to person, place, and time. He has normal strength. No cranial nerve deficit or sensory deficit. Coordination normal. GCS eye subscore is 4. GCS verbal subscore is 5. GCS motor subscore is 6.  Slow to respond but oriented  Skin: Skin is warm, dry and intact. No rash noted. No cyanosis.  Psychiatric: He has a normal  mood and affect. His speech is normal and behavior is normal. Thought content normal.    ED Course  Procedures (including critical care time) Labs Reviewed  CBC WITH DIFFERENTIAL - Abnormal; Notable for the following:    HCT 38.3 (*)    RDW 15.7 (*)    Platelets 77 (*)    Monocytes Relative 14 (*)    All other components within normal limits  COMPREHENSIVE METABOLIC PANEL - Abnormal; Notable for the following:    Albumin 3.1 (*)    AST 91 (*)    ALT 85 (*)    Alkaline Phosphatase 132 (*)    All other components within normal limits  URINALYSIS, ROUTINE W REFLEX MICROSCOPIC - Abnormal; Notable for the following:    Bilirubin Urine SMALL (*)    All other components within normal limits  PROTIME-INR  AMMONIA  ETHANOL  CG4 I-STAT (LACTIC ACID)   Ct Head Wo Contrast  04/06/2013   *RADIOLOGY REPORT*  Clinical Data: Confusion.  CT HEAD WITHOUT CONTRAST  Technique:  Contiguous axial images were obtained from the base of the skull through the vertex without contrast.  Comparison: Head CT 07/10/2010.  Findings: No acute intracranial abnormalities.  Specifically, no evidence of acute intracranial hemorrhage, no definite findings of acute/subacute cerebral ischemia, no mass, mass effect, hydrocephalus or abnormal intra or extra-axial fluid collections. Visualized  paranasal sinuses and mastoids are well pneumatized, with exception of a small lesion in the medial aspect of the right maxillary sinus which may represent a mucosal retention cyst or polyp.  No acute displaced skull fractures are identified.  IMPRESSION: 1.  No acute intracranial abnormalities. 2.  Small mucosal retention cyst versus polyp in the medial aspect of the right maxillary sinus incidentally noted.   Original Report Authenticated By: Trudie Reed, M.D.   Diagnoses: 1. Right upper quadrant abdominal pain 2. Elevated LFTs 3. Mental status changes  MDM  Patient comes today for evaluation of mental status changes. He has a history of liver cirrhosis. Patient also indicates that he has been under outpatient workup by Doctor Dulce Sellar, gastroenterology, for possible gallbladder disease. Patient does have slightly elevated LFTs. Reviewing his records reveals multiple recent ultrasound without evidence of gallstones. He is scheduled for a HIDA scan.  Patient was reportedly more confused than usual according to family. He is exhibiting some slurred speech here, but is completely oriented. He has a normal neurologic exam otherwise. Ammonia was normal.  At this point, it case was discussed with Doctor Betti Cruz, on call for hospitalist service. The possibility of admission was discussed. It was felt, however, the patient has had multiple admissions for similar presentations in the recent months. Doctor Betti Cruz asks that I try to get the patient comfortable with pain medicine and discharged to follow up with Doctor Betti Cruz in the morning. As the patient does not appear unstable, I do feel that this was reasonable and patient was discharged.  Gilda Crease, MD 04/07/13 [phone]

## 2013-04-06 NOTE — ED Notes (Signed)
Per EMS - pt coming from home, has hx of cirrhosis of liver denies drinking/drugs. Around noon pt started to feel weaker than normal, had hard time walking. CBG 101 BP 112/80, HR 80, 12 lead unremarkable. Speech is slower than normal according to family. Family sts that last time he got like this his ammonia level was elevated. No neuro deficits noted.

## 2013-04-07 MED ORDER — HYDROMORPHONE HCL PF 1 MG/ML IJ SOLN
1.0000 mg | Freq: Once | INTRAMUSCULAR | Status: AC
  Administered 2013-04-07: 1 mg via INTRAVENOUS
  Filled 2013-04-07: qty 1

## 2013-04-07 MED ORDER — ONDANSETRON HCL 4 MG/2ML IJ SOLN
4.0000 mg | Freq: Once | INTRAMUSCULAR | Status: AC
  Administered 2013-04-07: 4 mg via INTRAVENOUS
  Filled 2013-04-07: qty 2

## 2013-04-07 NOTE — ED Notes (Addendum)
Pt was caught with his Right hand in the needle box in room 12. RN caught patient and states what are you trying to get. You are not allowed to put your hands or any other objects in that box. Pt states he was just looking in it. Red needle box is broken. House keeping came to change out the box. Charge nurse notified. Pt has no cuts or bruises on hands.

## 2013-04-07 NOTE — ED Notes (Signed)
Pt states he is frustrated and wants to go home. Pt states he is in pain but was just sleep. Pt given a cup of coffee.

## 2013-04-13 ENCOUNTER — Ambulatory Visit (HOSPITAL_BASED_OUTPATIENT_CLINIC_OR_DEPARTMENT_OTHER): Payer: Medicaid Other | Attending: Family Medicine

## 2013-04-13 ENCOUNTER — Encounter (HOSPITAL_COMMUNITY)
Admission: RE | Admit: 2013-04-13 | Discharge: 2013-04-13 | Disposition: A | Discharge status: Home / Self Care | Payer: Medicaid Other | Source: Ambulatory Visit | Attending: Gastroenterology | Admitting: Gastroenterology

## 2013-04-13 DIAGNOSIS — R1011 Right upper quadrant pain: Secondary | ICD-10-CM

## 2013-04-13 MED ORDER — TECHNETIUM TC 99M MEBROFENIN IV KIT
5.0000 | PACK | Freq: Once | INTRAVENOUS | Status: AC | PRN
  Administered 2013-04-13: 5 via INTRAVENOUS

## 2013-04-22 ENCOUNTER — Ambulatory Visit: Payer: Medicaid Other | Attending: Anesthesiology | Admitting: Physical Therapy

## 2013-04-22 DIAGNOSIS — M545 Low back pain, unspecified: Secondary | ICD-10-CM | POA: Insufficient documentation

## 2013-04-22 DIAGNOSIS — R262 Difficulty in walking, not elsewhere classified: Secondary | ICD-10-CM | POA: Insufficient documentation

## 2013-04-22 DIAGNOSIS — M542 Cervicalgia: Secondary | ICD-10-CM | POA: Insufficient documentation

## 2013-04-22 DIAGNOSIS — IMO0001 Reserved for inherently not codable concepts without codable children: Secondary | ICD-10-CM | POA: Insufficient documentation

## 2013-04-24 ENCOUNTER — Ambulatory Visit (INDEPENDENT_AMBULATORY_CARE_PROVIDER_SITE_OTHER): Payer: Medicaid Other | Admitting: General Surgery

## 2013-05-13 ENCOUNTER — Emergency Department (HOSPITAL_COMMUNITY)
Admission: EM | Admit: 2013-05-13 | Discharge: 2013-05-13 | Disposition: A | Discharge status: Home / Self Care | Payer: Medicaid Other | Attending: Emergency Medicine | Admitting: Emergency Medicine

## 2013-05-13 ENCOUNTER — Ambulatory Visit (INDEPENDENT_AMBULATORY_CARE_PROVIDER_SITE_OTHER): Payer: Medicaid Other | Admitting: General Surgery

## 2013-05-13 ENCOUNTER — Encounter (HOSPITAL_COMMUNITY): Payer: Self-pay | Admitting: Emergency Medicine

## 2013-05-13 DIAGNOSIS — F17200 Nicotine dependence, unspecified, uncomplicated: Secondary | ICD-10-CM | POA: Insufficient documentation

## 2013-05-13 DIAGNOSIS — Z8619 Personal history of other infectious and parasitic diseases: Secondary | ICD-10-CM | POA: Insufficient documentation

## 2013-05-13 DIAGNOSIS — M129 Arthropathy, unspecified: Secondary | ICD-10-CM | POA: Insufficient documentation

## 2013-05-13 DIAGNOSIS — K7460 Unspecified cirrhosis of liver: Secondary | ICD-10-CM

## 2013-05-13 DIAGNOSIS — G8929 Other chronic pain: Secondary | ICD-10-CM | POA: Insufficient documentation

## 2013-05-13 DIAGNOSIS — D696 Thrombocytopenia, unspecified: Secondary | ICD-10-CM | POA: Insufficient documentation

## 2013-05-13 DIAGNOSIS — R509 Fever, unspecified: Secondary | ICD-10-CM | POA: Insufficient documentation

## 2013-05-13 DIAGNOSIS — Z9089 Acquired absence of other organs: Secondary | ICD-10-CM | POA: Insufficient documentation

## 2013-05-13 DIAGNOSIS — G2581 Restless legs syndrome: Secondary | ICD-10-CM | POA: Insufficient documentation

## 2013-05-13 DIAGNOSIS — Z79899 Other long term (current) drug therapy: Secondary | ICD-10-CM | POA: Insufficient documentation

## 2013-05-13 DIAGNOSIS — Z9889 Other specified postprocedural states: Secondary | ICD-10-CM | POA: Insufficient documentation

## 2013-05-13 DIAGNOSIS — R0602 Shortness of breath: Secondary | ICD-10-CM | POA: Insufficient documentation

## 2013-05-13 DIAGNOSIS — R197 Diarrhea, unspecified: Secondary | ICD-10-CM | POA: Insufficient documentation

## 2013-05-13 DIAGNOSIS — Z8701 Personal history of pneumonia (recurrent): Secondary | ICD-10-CM | POA: Insufficient documentation

## 2013-05-13 DIAGNOSIS — R109 Unspecified abdominal pain: Secondary | ICD-10-CM

## 2013-05-13 DIAGNOSIS — Z87828 Personal history of other (healed) physical injury and trauma: Secondary | ICD-10-CM | POA: Insufficient documentation

## 2013-05-13 DIAGNOSIS — F411 Generalized anxiety disorder: Secondary | ICD-10-CM | POA: Insufficient documentation

## 2013-05-13 DIAGNOSIS — I10 Essential (primary) hypertension: Secondary | ICD-10-CM | POA: Insufficient documentation

## 2013-05-13 DIAGNOSIS — K219 Gastro-esophageal reflux disease without esophagitis: Secondary | ICD-10-CM | POA: Insufficient documentation

## 2013-05-13 DIAGNOSIS — K7030 Alcoholic cirrhosis of liver without ascites: Secondary | ICD-10-CM | POA: Insufficient documentation

## 2013-05-13 DIAGNOSIS — R112 Nausea with vomiting, unspecified: Secondary | ICD-10-CM | POA: Insufficient documentation

## 2013-05-13 LAB — CBC WITH DIFFERENTIAL/PLATELET
Basophils Relative: 1 % (ref 0–1)
Hemoglobin: 13 g/dL (ref 13.0–17.0)
Lymphocytes Relative: 31 % (ref 12–46)
MCHC: 33.7 g/dL (ref 30.0–36.0)
Monocytes Relative: 10 % (ref 3–12)
Neutro Abs: 2.2 10*3/uL (ref 1.7–7.7)
Neutrophils Relative %: 57 % (ref 43–77)
RBC: 4.52 MIL/uL (ref 4.22–5.81)
WBC: 3.8 10*3/uL — ABNORMAL LOW (ref 4.0–10.5)

## 2013-05-13 LAB — AMMONIA: Ammonia: 42 umol/L (ref 11–60)

## 2013-05-13 LAB — URINALYSIS, ROUTINE W REFLEX MICROSCOPIC
Glucose, UA: NEGATIVE mg/dL
Leukocytes, UA: NEGATIVE
Protein, ur: NEGATIVE mg/dL
Specific Gravity, Urine: 1.02 (ref 1.005–1.030)
pH: 6.5 (ref 5.0–8.0)

## 2013-05-13 LAB — COMPREHENSIVE METABOLIC PANEL
AST: 75 U/L — ABNORMAL HIGH (ref 0–37)
Albumin: 3.1 g/dL — ABNORMAL LOW (ref 3.5–5.2)
Alkaline Phosphatase: 127 U/L — ABNORMAL HIGH (ref 39–117)
BUN: 12 mg/dL (ref 6–23)
Chloride: 107 mEq/L (ref 96–112)
Potassium: 3.9 mEq/L (ref 3.5–5.1)
Total Bilirubin: 0.3 mg/dL (ref 0.3–1.2)

## 2013-05-13 LAB — LIPASE, BLOOD: Lipase: 19 U/L (ref 11–59)

## 2013-05-13 MED ORDER — HYDROMORPHONE HCL PF 1 MG/ML IJ SOLN
1.0000 mg | Freq: Once | INTRAMUSCULAR | Status: AC
  Administered 2013-05-13: 1 mg via INTRAVENOUS
  Filled 2013-05-13: qty 1

## 2013-05-13 MED ORDER — ONDANSETRON HCL 4 MG/2ML IJ SOLN
4.0000 mg | Freq: Once | INTRAMUSCULAR | Status: AC
  Administered 2013-05-13: 4 mg via INTRAVENOUS
  Filled 2013-05-13: qty 2

## 2013-05-13 NOTE — ED Notes (Signed)
Patient reports that he is having pain right at his umbilacal area. Feels like his "gallbladder".

## 2013-05-13 NOTE — ED Notes (Signed)
Patient has a history of liver cirrosis and chronic liver disease due to ETOH abuse. Patient having abdominal pain

## 2013-05-13 NOTE — ED Provider Notes (Signed)
CSN: [id number]     Arrival date & time 05/13/13  1719 History     First MD Initiated Contact with Patient 05/13/13 1724     Chief Complaint  Patient presents with  . Abdominal Pain   (Consider location/radiation/quality/duration/timing/severity/associated sxs/prior Treatment) HPI Comments: Patient with history of liver disease, cirrhosis, esophageal varices -- presents with complaint of right upper and epigastric abdominal pain that is chronic, but worse today. It is associated with nonbloody vomiting. Patient has not been able to keep down fluids or solids. Pain is dull and similar to previous pain. He describes loose nonbloody stools. History of appendectomy. He was to see a surgeon today to discuss removal of his gallbladder however was sent home after he could not pay a co-pay. He takes oxycodone at home for pain. He describes subjective fever. No urinary symptoms. Onset of symptoms gradual. Course is constant. Nothing makes symptoms better or worse. Multiple ultrasounds in past 6 months is showing gallbladder sludge at times, normal HIDA scan.  Patient is a 52 y.o. male presenting with abdominal pain. The history is provided by the patient and medical records.  Abdominal Pain Associated symptoms: diarrhea, nausea and vomiting   Associated symptoms: no chest pain, no cough, no dysuria, no fever and no sore throat     Past Medical History  Diagnosis Date  . Hepatic cirrhosis   . Hypertension     NO LONGER TAKING B/P MEDS--STATES DISCONTINUED BY HIS DOCTOR  . Anxiety   . Shortness of breath   . GERD (gastroesophageal reflux disease)   . Restless leg syndrome   . Complication of anesthesia     ENDO/COLONOSCOPY ATTEMPTED IN THE PAST-UNABLE TO ADEQUATELY SEDATE PT-TOLD WOULD NEED ANESTHESIA FOR FUTURE PROCEDURES  . Pneumonia 09-2012  . Headache(784.0)     HX OF MIGRAINES  . Arthritis     hands  . Hepatitis B 01-30-13    liver enzymes remains elevated  . Hepatitis C   . Wound  healing, delayed 01-30-13    3-22-'14 s/p I&D right ankle surgery(infection) -minimal drainage with dressing  . 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 Syncope 03/09/2013   Past Surgical History  Procedure Laterality Date  . Appendectomy    . Esophageal banding      FOR TX OF GI BLEED ABOUT 2008  . Arm debridement  08-11-12    right forearm wound with debridement 2 months ago/ right abdomen also-healing well  . Multiple tooth extractions  08/12/2012  . Colonoscopy with propofol  08/13/2012    Procedure: COLONOSCOPY WITH PROPOFOL;  Surgeon: Willis Modena, MD;  Location: [facility] ENDOSCOPY;  Service: Endoscopy;  Laterality: N/A;  . 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 Esophagogastroduodenoscopy N/A 11/18/2012    Procedure: ESOPHAGOGASTRODUODENOSCOPY (EGD);  Surgeon: Shirley Friar, MD;  Location: [facility] ENDOSCOPY;  Service: Endoscopy;  Laterality: N/A;  . Esophageal banding  11/18/2012    Procedure: ESOPHAGEAL BANDING;  Surgeon: Shirley Friar, MD;  Location: [facility] ENDOSCOPY;  Service: Endoscopy;;  . I&d extremity Right 12/13/2012    Procedure: IRRIGATION AND DEBRIDEMENT EXTREMITY;  Surgeon: Shelda Pal, MD;  Location: [facility] OR;  Service: Orthopedics;  Laterality: Right;  . Ankle surgery      Debridement of ankle 12-13-12-[place]/ Dr. Ashley Royalty  . Peripherally inserted central catheter insertion      insertion and removal(IVR)  . Esophagogastroduodenoscopy N/A 02/03/2013    Procedure:  ESOPHAGOGASTRODUODENOSCOPY (EGD);  Surgeon: Shirley Friar, MD;  Location: [facility] ENDOSCOPY;  Service: Endoscopy;  Laterality: N/A;  . Esophagogastroduodenoscopy N/A 03/07/2013    Procedure: ESOPHAGOGASTRODUODENOSCOPY (EGD);  Surgeon: Petra Kuba, MD;  Location: [facility] ENDOSCOPY;  Service: Endoscopy;  Laterality: N/A;  . Esophageal banding  03/07/2013    Procedure: ESOPHAGEAL BANDING;  Surgeon: Petra Kuba, MD;  Location: [facility] ENDOSCOPY;  Service: Endoscopy;;    History reviewed. No pertinent family history. History  Substance Use Topics  . Smoking status: Current Every Day Smoker -- 0.50 packs/day for 30 years    Types: Cigarettes  . Smokeless tobacco: Never Used  . Alcohol Use: No     Comment: RARELY USES ALCOHOL    Review of Systems  Constitutional: Negative for fever.  HENT: Negative for sore throat and rhinorrhea.   Eyes: Negative for redness.  Respiratory: Negative for cough.   Cardiovascular: Negative for chest pain.  Gastrointestinal: Positive for nausea, vomiting, abdominal pain and diarrhea. Negative for blood in stool.  Genitourinary: Negative for dysuria.  Musculoskeletal: Negative for myalgias.  Skin: Negative for rash.  Neurological: Negative for headaches.    Allergies  Tylenol; Asa; Morphine and related; and Tramadol  Home Medications   Current Outpatient Rx  Name  Route  Sig  Dispense  Refill  . albuterol (PROVENTIL HFA;VENTOLIN HFA) 108 (90 BASE) MCG/ACT inhaler   Inhalation   Inhale 1-2 puffs into the lungs every 4 (four) hours as needed for wheezing. For wheezing         . clonazePAM (KLONOPIN) 0.5 MG tablet   Oral   Take 0.5-1.5 mg by mouth at bedtime.         . docusate sodium (COLACE) 100 MG capsule   Oral   Take 100 mg by mouth 2 (two) times daily as needed for constipation. For constipation         . FLUoxetine (PROZAC) 40 MG capsule   Oral   Take 40 mg by mouth daily.         Marland Kitchen oxyCODONE (OXY IR/ROXICODONE) 5 MG immediate release tablet   Oral   Take 1 tablet (5 mg total) by mouth every 4 (four) hours as needed for pain. For pain   30 tablet   0   . pantoprazole (PROTONIX) 40 MG tablet   Oral   Take 1 tablet (40 mg total) by mouth daily.   30 tablet   11   . promethazine (PHENERGAN) 25 MG tablet   Oral   Take 25 mg by mouth every 6 (six) hours as needed for nausea.         . propranolol (INDERAL) 10 MG tablet   Oral   Take 1 tablet (10 mg total) by mouth 3 (three) times  daily.   90 tablet   0    BP 139/102  Pulse 74  Temp(Src) 98.7 F (37.1 C) (Oral)  Resp 18  Wt 195 lb (88.451 kg)  BMI 23.75 kg/m2  SpO2 100% Physical Exam  Nursing note and vitals reviewed. Constitutional: He appears well-developed and well-nourished.  HENT:  Head: Normocephalic and atraumatic.  Eyes: Conjunctivae are normal. Right eye exhibits no discharge. Left eye exhibits no discharge.  Neck: Normal range of motion. Neck supple.  Cardiovascular: Normal rate, regular rhythm and normal heart sounds.   Pulmonary/Chest: Effort normal and breath sounds normal.  Abdominal: Soft. There is tenderness in the right upper quadrant, epigastric area and periumbilical area. There is no rigidity, no rebound, no  guarding, no CVA tenderness, no tenderness at McBurney's point and negative Murphy's sign.  Neurological: He is alert.  Skin: Skin is warm and dry.  Psychiatric: He has a normal mood and affect.    ED Course   Procedures (including critical care time)  Labs Reviewed  CBC WITH DIFFERENTIAL - Abnormal; Notable for the following:    WBC 3.8 (*)    HCT 38.6 (*)    Platelets 84 (*)    All other components within normal limits  COMPREHENSIVE METABOLIC PANEL - Abnormal; Notable for the following:    Albumin 3.1 (*)    AST 75 (*)    ALT 63 (*)    Alkaline Phosphatase 127 (*)    All other components within normal limits  AMMONIA  LIPASE, BLOOD  CBC WITH DIFFERENTIAL  URINALYSIS, ROUTINE W REFLEX MICROSCOPIC   No results found.  1. Abdominal pain   2. Cirrhosis of liver   3. Thrombocytopenia     5:44 PM Patient seen and examined. Previous medical records reviewed. Work-up initiated. Medications ordered.   Vital signs reviewed and are as follows: Filed Vitals:   05/13/13 1733  BP: 139/102  Pulse: 74  Temp: 98.7 F (37.1 C)  Resp: 18   9:22 PM D/w Dr. Fayrene Fearing who has seen. Pain improved at time of discharge. Exam unchanged/stable.   Labs are stable. Pt informed of  results. Urged f/u with his PCP. He has home medications to use for pain.   The patient was urged to return to the Emergency Department immediately with worsening of current symptoms, worsening abdominal pain, persistent vomiting, blood noted in stools, fever, or any other concerns. The patient verbalized understanding.   MDM  Patient with h/o cirrhosis, recent eval for biliary dx, multiple recent US. Pain is more severe but is his typical pain. Abd soft on exam. Labs are at patient's baseline. Exam unchanged during ED stay and pain improved with parenteral medications. Feel patient stable for d/c. Given history of previous work-up and stable liver functions/WBC do not feel additional [place] indicated at this time. Pt well, non-toxic.     Renne Crigler, PA-C 05/13/13 2126

## 2013-05-13 NOTE — ED Notes (Signed)
Bed: WA07 Expected date:  Expected time:  Means of arrival:  Comments: ems- right abdominal pain. Hx liver disease

## 2013-05-13 NOTE — ED Notes (Signed)
Pt is not able to urinate.  Will try again in 30 minutes. 

## 2013-05-14 NOTE — ED Provider Notes (Signed)
Medical screening examination/treatment/procedure(s) were conducted as a shared visit with non-physician practitioner(s) and myself.  I personally evaluated the patient during the encounter   Claudean Kinds, MD 05/14/13 [phone]

## 2013-05-18 ENCOUNTER — Emergency Department (HOSPITAL_COMMUNITY)
Admission: EM | Admit: 2013-05-18 | Discharge: 2013-05-18 | Disposition: A | Discharge status: Home / Self Care | Payer: Medicaid Other | Attending: Emergency Medicine | Admitting: Emergency Medicine

## 2013-05-18 ENCOUNTER — Encounter (HOSPITAL_COMMUNITY): Payer: Self-pay | Admitting: *Deleted

## 2013-05-18 ENCOUNTER — Emergency Department (HOSPITAL_COMMUNITY): Payer: Medicaid Other

## 2013-05-18 DIAGNOSIS — Z8669 Personal history of other diseases of the nervous system and sense organs: Secondary | ICD-10-CM | POA: Insufficient documentation

## 2013-05-18 DIAGNOSIS — Z8619 Personal history of other infectious and parasitic diseases: Secondary | ICD-10-CM | POA: Insufficient documentation

## 2013-05-18 DIAGNOSIS — Z8719 Personal history of other diseases of the digestive system: Secondary | ICD-10-CM | POA: Insufficient documentation

## 2013-05-18 DIAGNOSIS — F17200 Nicotine dependence, unspecified, uncomplicated: Secondary | ICD-10-CM | POA: Insufficient documentation

## 2013-05-18 DIAGNOSIS — R1011 Right upper quadrant pain: Secondary | ICD-10-CM | POA: Insufficient documentation

## 2013-05-18 DIAGNOSIS — Z8739 Personal history of other diseases of the musculoskeletal system and connective tissue: Secondary | ICD-10-CM | POA: Insufficient documentation

## 2013-05-18 DIAGNOSIS — R197 Diarrhea, unspecified: Secondary | ICD-10-CM | POA: Insufficient documentation

## 2013-05-18 DIAGNOSIS — Z79899 Other long term (current) drug therapy: Secondary | ICD-10-CM | POA: Insufficient documentation

## 2013-05-18 DIAGNOSIS — F411 Generalized anxiety disorder: Secondary | ICD-10-CM | POA: Insufficient documentation

## 2013-05-18 DIAGNOSIS — Z8701 Personal history of pneumonia (recurrent): Secondary | ICD-10-CM | POA: Insufficient documentation

## 2013-05-18 DIAGNOSIS — I10 Essential (primary) hypertension: Secondary | ICD-10-CM | POA: Insufficient documentation

## 2013-05-18 DIAGNOSIS — K219 Gastro-esophageal reflux disease without esophagitis: Secondary | ICD-10-CM | POA: Insufficient documentation

## 2013-05-18 DIAGNOSIS — G8929 Other chronic pain: Secondary | ICD-10-CM | POA: Insufficient documentation

## 2013-05-18 LAB — CBC WITH DIFFERENTIAL/PLATELET
Basophils Absolute: 0 10*3/uL (ref 0.0–0.1)
Basophils Relative: 0 % (ref 0–1)
Hemoglobin: 12.7 g/dL — ABNORMAL LOW (ref 13.0–17.0)
MCHC: 34.9 g/dL (ref 30.0–36.0)
Monocytes Relative: 10 % (ref 3–12)
Neutro Abs: 1.9 10*3/uL (ref 1.7–7.7)
Neutrophils Relative %: 56 % (ref 43–77)
Platelets: 73 10*3/uL — ABNORMAL LOW (ref 150–400)

## 2013-05-18 LAB — COMPREHENSIVE METABOLIC PANEL
ALT: 67 U/L — ABNORMAL HIGH (ref 0–53)
CO2: 27 mEq/L (ref 19–32)
Calcium: 9.2 mg/dL (ref 8.4–10.5)
Creatinine, Ser: 0.81 mg/dL (ref 0.50–1.35)
GFR calc Af Amer: >90 mL/min (ref >90)
GFR calc non Af Amer: >90 mL/min (ref >90)
Glucose, Bld: 96 mg/dL (ref 70–99)
Sodium: 138 mEq/L (ref 135–145)
Total Bilirubin: 0.4 mg/dL (ref 0.3–1.2)

## 2013-05-18 LAB — URINALYSIS, ROUTINE W REFLEX MICROSCOPIC
Hgb urine dipstick: NEGATIVE
Leukocytes, UA: NEGATIVE
Nitrite: NEGATIVE
Protein, ur: NEGATIVE mg/dL
Specific Gravity, Urine: 1.017 (ref 1.005–1.030)
Urobilinogen, UA: 1 mg/dL (ref 0.0–1.0)

## 2013-05-18 LAB — LIPASE, BLOOD: Lipase: 45 U/L (ref 11–59)

## 2013-05-18 LAB — PROTIME-INR: Prothrombin Time: 14 seconds (ref 11.6–15.2)

## 2013-05-18 MED ORDER — HYDROMORPHONE HCL PF 1 MG/ML IJ SOLN
1.0000 mg | Freq: Once | INTRAMUSCULAR | Status: AC
  Administered 2013-05-18: 1 mg via INTRAVENOUS
  Filled 2013-05-18: qty 1

## 2013-05-18 MED ORDER — PROCHLORPERAZINE EDISYLATE 5 MG/ML IJ SOLN
10.0000 mg | Freq: Once | INTRAMUSCULAR | Status: AC
  Administered 2013-05-18: 10 mg via INTRAVENOUS
  Filled 2013-05-18: qty 2

## 2013-05-18 MED ORDER — SODIUM CHLORIDE 0.9 % IV BOLUS (SEPSIS)
1000.0000 mL | Freq: Once | INTRAVENOUS | Status: AC
  Administered 2013-05-18: 1000 mL via INTRAVENOUS

## 2013-05-18 MED ORDER — HYDROMORPHONE HCL PF 1 MG/ML IJ SOLN
0.5000 mg | Freq: Once | INTRAMUSCULAR | Status: AC
  Administered 2013-05-18: 0.5 mg via INTRAVENOUS

## 2013-05-18 MED ORDER — OXYCODONE HCL 5 MG PO TABS: 5.0000 mg | ORAL_TABLET | Freq: Four times a day (QID) | ORAL | Status: DC | PRN

## 2013-05-18 MED ORDER — HYDROMORPHONE HCL PF 1 MG/ML IJ SOLN
INTRAMUSCULAR | Status: AC
  Filled 2013-05-18: qty 1

## 2013-05-18 MED ORDER — PROCHLORPERAZINE MALEATE 10 MG PO TABS: 10.0000 mg | ORAL_TABLET | Freq: Two times a day (BID) | ORAL | Status: DC | PRN

## 2013-05-18 NOTE — ED Notes (Signed)
Patient transported to Ultrasound 

## 2013-05-18 NOTE — ED Notes (Signed)
Pt comes from home via EMS complaining of NV and states there was some blood in vomit per pt.  No vomiting with EMS, history of liver cerosis hep B and C but states he ahs not drank 10 years.  4 mg zofran given IM by EMS with some releif
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
## 2013-05-18 NOTE — ED Notes (Signed)
Patient provided a bus pass and cup of coffee. Ambulatory to discharge.

## 2013-05-18 NOTE — ED Notes (Signed)
MD at bedside. 

## 2013-06-03 ENCOUNTER — Emergency Department (HOSPITAL_COMMUNITY)
Admission: EM | Admit: 2013-06-03 | Discharge: 2013-06-04 | Disposition: A | Discharge status: Home / Self Care | Payer: Medicaid Other | Attending: Emergency Medicine | Admitting: Emergency Medicine

## 2013-06-03 ENCOUNTER — Encounter (HOSPITAL_COMMUNITY): Payer: Self-pay

## 2013-06-03 DIAGNOSIS — R300 Dysuria: Secondary | ICD-10-CM | POA: Insufficient documentation

## 2013-06-03 DIAGNOSIS — Z8701 Personal history of pneumonia (recurrent): Secondary | ICD-10-CM | POA: Insufficient documentation

## 2013-06-03 DIAGNOSIS — Z8739 Personal history of other diseases of the musculoskeletal system and connective tissue: Secondary | ICD-10-CM | POA: Insufficient documentation

## 2013-06-03 DIAGNOSIS — M545 Low back pain, unspecified: Secondary | ICD-10-CM | POA: Insufficient documentation

## 2013-06-03 DIAGNOSIS — F17200 Nicotine dependence, unspecified, uncomplicated: Secondary | ICD-10-CM | POA: Insufficient documentation

## 2013-06-03 DIAGNOSIS — Z8619 Personal history of other infectious and parasitic diseases: Secondary | ICD-10-CM | POA: Insufficient documentation

## 2013-06-03 DIAGNOSIS — K219 Gastro-esophageal reflux disease without esophagitis: Secondary | ICD-10-CM | POA: Insufficient documentation

## 2013-06-03 DIAGNOSIS — Z79899 Other long term (current) drug therapy: Secondary | ICD-10-CM | POA: Insufficient documentation

## 2013-06-03 DIAGNOSIS — Z8669 Personal history of other diseases of the nervous system and sense organs: Secondary | ICD-10-CM | POA: Insufficient documentation

## 2013-06-03 DIAGNOSIS — I10 Essential (primary) hypertension: Secondary | ICD-10-CM | POA: Insufficient documentation

## 2013-06-03 DIAGNOSIS — F411 Generalized anxiety disorder: Secondary | ICD-10-CM | POA: Insufficient documentation

## 2013-06-03 DIAGNOSIS — G8929 Other chronic pain: Secondary | ICD-10-CM | POA: Insufficient documentation

## 2013-06-03 LAB — URINALYSIS, ROUTINE W REFLEX MICROSCOPIC
Bilirubin Urine: NEGATIVE
Glucose, UA: NEGATIVE mg/dL
Hgb urine dipstick: NEGATIVE
Protein, ur: NEGATIVE mg/dL
Urobilinogen, UA: 4 mg/dL — ABNORMAL HIGH (ref 0.0–1.0)

## 2013-06-03 NOTE — ED Notes (Signed)
Pt c/o bilateral kidney pain starting tonight, pt reports pain with urination. Pt denies penile discharge or bleeding. Pt reports a strong odor to his urine

## 2013-06-04 MED ORDER — HYDROMORPHONE HCL PF 1 MG/ML IJ SOLN
1.0000 mg | Freq: Once | INTRAMUSCULAR | Status: AC
  Administered 2013-06-04: 1 mg via INTRAMUSCULAR
  Filled 2013-06-04: qty 1

## 2013-06-04 MED ORDER — DIAZEPAM 5 MG PO TABS
10.0000 mg | ORAL_TABLET | Freq: Once | ORAL | Status: AC
  Administered 2013-06-04: 10 mg via ORAL
  Filled 2013-06-04: qty 2

## 2013-06-04 NOTE — ED Provider Notes (Signed)
CSN: [id number]     Arrival date & time 06/03/13  2210 History   First MD Initiated Contact with Patient 06/04/13 0001     Chief Complaint  Patient presents with  . Flank Pain   (Consider location/radiation/quality/duration/timing/severity/associated sxs/prior Treatment) Patient is a 52 y.o. male presenting with flank pain. The history is provided by the patient.  Flank Pain   patient here bilateral flank pain with some associated dysuria as well as foul-smelling urine. Going to the patient the records, he has a history of chronic lower back pain. Denies any radicular symptoms. No saddle anesthesia. Denies any fever or chills. No vomiting noted. Pain is not along the midline. Symptoms worse with standing or movement. No treatment used prior to arrival.  Past Medical History  Diagnosis Date  . Hepatic cirrhosis   . Hypertension     NO LONGER TAKING B/P MEDS--STATES DISCONTINUED BY HIS DOCTOR  . Anxiety   . Shortness of breath   . GERD (gastroesophageal reflux disease)   . Restless leg syndrome   . Complication of anesthesia     ENDO/COLONOSCOPY ATTEMPTED IN THE PAST-UNABLE TO ADEQUATELY SEDATE PT-TOLD WOULD NEED ANESTHESIA FOR FUTURE PROCEDURES  . Pneumonia 09-2012  . Headache(784.0)     HX OF MIGRAINES  . Arthritis     hands  . Hepatitis B 01-30-13    liver enzymes remains elevated  . Hepatitis C   . Wound healing, delayed 01-30-13    3-22-'14 s/p I&D right ankle surgery(infection) -minimal drainage with dressing  . 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 Syncope 03/09/2013   Past Surgical History  Procedure Laterality Date  . Appendectomy    . Esophageal banding      FOR TX OF GI BLEED ABOUT 2008  . Arm debridement  08-11-12    right forearm wound with debridement 2 months ago/ right abdomen also-healing well  . Multiple tooth extractions  08/12/2012  . Colonoscopy with propofol  08/13/2012    Procedure: COLONOSCOPY WITH PROPOFOL;  Surgeon: Willis Modena, MD;  Location: [facility] ENDOSCOPY;  Service: Endoscopy;  Laterality: N/A;  . 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 Esophagogastroduodenoscopy N/A 11/18/2012    Procedure: ESOPHAGOGASTRODUODENOSCOPY (EGD);  Surgeon: Shirley Friar, MD;  Location: [facility] ENDOSCOPY;  Service: Endoscopy;  Laterality: N/A;  . Esophageal banding  11/18/2012    Procedure: ESOPHAGEAL BANDING;  Surgeon: Shirley Friar, MD;  Location: [facility] ENDOSCOPY;  Service: Endoscopy;;  . I&d extremity Right 12/13/2012    Procedure: IRRIGATION AND DEBRIDEMENT EXTREMITY;  Surgeon: Shelda Pal, MD;  Location: [facility] OR;  Service: Orthopedics;  Laterality: Right;  . Ankle surgery      Debridement of ankle 12-13-12-[place]/ Dr. Ashley Royalty  . Peripherally inserted central catheter insertion      insertion and removal(IVR)  . Esophagogastroduodenoscopy N/A 02/03/2013    Procedure: ESOPHAGOGASTRODUODENOSCOPY (EGD);  Surgeon: Shirley Friar, MD;  Location: [facility] ENDOSCOPY;  Service: Endoscopy;  Laterality: N/A;  . Esophagogastroduodenoscopy N/A 03/07/2013    Procedure: ESOPHAGOGASTRODUODENOSCOPY (EGD);  Surgeon: Petra Kuba, MD;  Location: [facility] ENDOSCOPY;  Service: Endoscopy;  Laterality: N/A;  . Esophageal banding  03/07/2013    Procedure: ESOPHAGEAL BANDING;  Surgeon: Petra Kuba, MD;  Location: [facility] ENDOSCOPY;  Service: Endoscopy;;   History reviewed. No pertinent family history. History  Substance Use Topics  . Smoking status: Current Every Day Smoker -- 0.50 packs/day for  30 years    Types: Cigarettes  . Smokeless tobacco: Never Used  . Alcohol Use: No     Comment: RARELY USES ALCOHOL    Review of Systems  Genitourinary: Positive for flank pain.  All other systems reviewed and are negative.    Allergies  Tylenol; Asa; Morphine and related; and Tramadol  Home Medications   Current Outpatient Rx  Name  Route  Sig  Dispense  Refill  . albuterol (PROVENTIL HFA;VENTOLIN HFA) 108 (90 BASE)  MCG/ACT inhaler   Inhalation   Inhale 1-2 puffs into the lungs every 4 (four) hours as needed for wheezing. For wheezing         . clonazePAM (KLONOPIN) 0.5 MG tablet   Oral   Take 0.5 mg by mouth at bedtime.          . docusate sodium (COLACE) 100 MG capsule   Oral   Take 100 mg by mouth 2 (two) times daily as needed for constipation. For constipation         . FLUoxetine (PROZAC) 40 MG capsule   Oral   Take 40 mg by mouth daily.         Marland Kitchen oxyCODONE (OXY IR/ROXICODONE) 5 MG immediate release tablet   Oral   Take 1 tablet (5 mg total) by mouth every 4 (four) hours as needed for pain. For pain   30 tablet   0   . pantoprazole (PROTONIX) 40 MG tablet   Oral   Take 1 tablet (40 mg total) by mouth daily.   30 tablet   11   . prochlorperazine (COMPAZINE) 10 MG tablet   Oral   Take 1 tablet (10 mg total) by mouth 2 (two) times daily as needed.   4 tablet   0   . promethazine (PHENERGAN) 25 MG tablet   Oral   Take 25 mg by mouth every 6 (six) hours as needed for nausea.         . propranolol (INDERAL) 10 MG tablet   Oral   Take 1 tablet (10 mg total) by mouth 3 (three) times daily.   90 tablet   0   . spironolactone (ALDACTONE) 50 MG tablet   Oral   Take 50 mg by mouth daily.          BP 118/87  Pulse 75  Temp(Src) 98 F (36.7 C) (Oral)  Resp 16  Ht 6\' 4"  (1.93 m)  Wt 200 lb (90.719 kg)  BMI 24.35 kg/m2  SpO2 98% Physical Exam  Nursing note and vitals reviewed. Constitutional: He is oriented to person, place, and time. He appears well-developed and well-nourished.  Non-toxic appearance. No distress.  HENT:  Head: Normocephalic and atraumatic.  Eyes: Conjunctivae, EOM and lids are normal. Pupils are equal, round, and reactive to light.  Neck: Normal range of motion. Neck supple. No tracheal deviation present. No mass present.  Cardiovascular: Normal rate, regular rhythm and normal heart sounds.  Exam reveals no gallop.   No murmur  heard. Pulmonary/Chest: Effort normal and breath sounds normal. No stridor. No respiratory distress. He has no decreased breath sounds. He has no wheezes. He has no rhonchi. He has no rales.  Abdominal: Soft. Normal appearance and bowel sounds are normal. He exhibits no distension. There is no tenderness. There is no rebound and no CVA tenderness.  Musculoskeletal: Normal range of motion. He exhibits no edema and no tenderness.       Back:  Neurological: He is alert and  oriented to person, place, and time. He has normal strength. No cranial nerve deficit or sensory deficit. GCS eye subscore is 4. GCS verbal subscore is 5. GCS motor subscore is 6.  Skin: Skin is warm and dry. No abrasion and no rash noted.  Psychiatric: He has a normal mood and affect. His speech is normal and behavior is normal.    ED Course  Procedures (including critical care time) Labs Review Labs Reviewed  URINALYSIS, ROUTINE W REFLEX MICROSCOPIC - Abnormal; Notable for the following:    APPearance CLOUDY (*)    Urobilinogen, UA 4.0 (*)    All other components within normal limits   Imaging Review No results found.  MDM  No diagnosis found. Patient with likely musculoskeletal back pain. No hematuria or infection in his urine. Doubt that patient has a kidney stone. Patient is in pain management at this time and has been out of his meds. Will give patient IM dose of medication here as well as muscle axis. Suspect that patient has muscular skeletal back pain no warning signs for cauda equina or epidural abscess. He is stable for discharge    Toy Baker, MD 06/04/13 [phone]

## 2013-07-21 ENCOUNTER — Encounter (HOSPITAL_COMMUNITY): Payer: Self-pay | Admitting: Emergency Medicine

## 2013-07-21 DIAGNOSIS — F411 Generalized anxiety disorder: Secondary | ICD-10-CM | POA: Insufficient documentation

## 2013-07-21 DIAGNOSIS — I10 Essential (primary) hypertension: Secondary | ICD-10-CM | POA: Insufficient documentation

## 2013-07-21 DIAGNOSIS — Z8669 Personal history of other diseases of the nervous system and sense organs: Secondary | ICD-10-CM | POA: Insufficient documentation

## 2013-07-21 DIAGNOSIS — S0003XA Contusion of scalp, initial encounter: Secondary | ICD-10-CM | POA: Insufficient documentation

## 2013-07-21 DIAGNOSIS — Z79899 Other long term (current) drug therapy: Secondary | ICD-10-CM | POA: Insufficient documentation

## 2013-07-21 DIAGNOSIS — Z8739 Personal history of other diseases of the musculoskeletal system and connective tissue: Secondary | ICD-10-CM | POA: Insufficient documentation

## 2013-07-21 DIAGNOSIS — K219 Gastro-esophageal reflux disease without esophagitis: Secondary | ICD-10-CM | POA: Insufficient documentation

## 2013-07-21 DIAGNOSIS — S3981XA Other specified injuries of abdomen, initial encounter: Secondary | ICD-10-CM | POA: Insufficient documentation

## 2013-07-21 DIAGNOSIS — S20219A Contusion of unspecified front wall of thorax, initial encounter: Secondary | ICD-10-CM | POA: Insufficient documentation

## 2013-07-21 DIAGNOSIS — Z8619 Personal history of other infectious and parasitic diseases: Secondary | ICD-10-CM | POA: Insufficient documentation

## 2013-07-21 DIAGNOSIS — F17200 Nicotine dependence, unspecified, uncomplicated: Secondary | ICD-10-CM | POA: Insufficient documentation

## 2013-07-21 DIAGNOSIS — Z9889 Other specified postprocedural states: Secondary | ICD-10-CM | POA: Insufficient documentation

## 2013-07-21 DIAGNOSIS — Z8701 Personal history of pneumonia (recurrent): Secondary | ICD-10-CM | POA: Insufficient documentation

## 2013-07-21 DIAGNOSIS — Z8719 Personal history of other diseases of the digestive system: Secondary | ICD-10-CM | POA: Insufficient documentation

## 2013-07-21 LAB — CBC WITH DIFFERENTIAL/PLATELET
Basophils Absolute: 0 10*3/uL (ref 0.0–0.1)
Eosinophils Absolute: 0.2 10*3/uL (ref 0.0–0.7)
Eosinophils Relative: 4 % (ref 0–5)
HCT: 43.6 % (ref 39.0–52.0)
Lymphocytes Relative: 32 % (ref 12–46)
Lymphs Abs: 1.4 10*3/uL (ref 0.7–4.0)
MCH: 29.5 pg (ref 26.0–34.0)
MCV: 86.3 fL (ref 78.0–100.0)
Monocytes Absolute: 0.6 10*3/uL (ref 0.1–1.0)
Platelets: 103 10*3/uL — ABNORMAL LOW (ref 150–400)
RDW: 15.6 % — ABNORMAL HIGH (ref 11.5–15.5)

## 2013-07-21 LAB — COMPREHENSIVE METABOLIC PANEL
ALT: 43 U/L (ref 0–53)
CO2: 25 mEq/L (ref 19–32)
Calcium: 9.7 mg/dL (ref 8.4–10.5)
Creatinine, Ser: 1.26 mg/dL (ref 0.50–1.35)
GFR calc Af Amer: 75 mL/min — ABNORMAL LOW (ref >90)
GFR calc non Af Amer: 65 mL/min — ABNORMAL LOW (ref >90)
Glucose, Bld: 98 mg/dL (ref 70–99)
Sodium: 138 mEq/L (ref 135–145)
Total Protein: 8.4 g/dL — ABNORMAL HIGH (ref 6.0–8.3)

## 2013-07-21 LAB — POCT I-STAT TROPONIN I
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
## 2013-07-22 ENCOUNTER — Encounter (HOSPITAL_COMMUNITY): Payer: Self-pay | Admitting: Radiology

## 2013-07-22 ENCOUNTER — Emergency Department (HOSPITAL_COMMUNITY): Payer: Medicaid Other

## 2013-07-22 ENCOUNTER — Emergency Department (HOSPITAL_COMMUNITY)
Admission: EM | Admit: 2013-07-22 | Discharge: 2013-07-22 | Disposition: A | Discharge status: Home / Self Care | Payer: Medicaid Other | Attending: Emergency Medicine | Admitting: Emergency Medicine

## 2013-07-22 DIAGNOSIS — S20211A Contusion of right front wall of thorax, initial encounter: Secondary | ICD-10-CM

## 2013-07-22 DIAGNOSIS — S0083XA Contusion of other part of head, initial encounter: Secondary | ICD-10-CM

## 2013-07-22 DIAGNOSIS — R109 Unspecified abdominal pain: Secondary | ICD-10-CM

## 2013-07-22 LAB — LIPASE, BLOOD: Lipase: 21 U/L (ref 11–59)

## 2013-07-22 LAB — RAPID URINE DRUG SCREEN, HOSP PERFORMED: Opiates: NOT DETECTED

## 2013-07-22 MED ORDER — FENTANYL CITRATE 0.05 MG/ML IJ SOLN
50.0000 ug | INTRAMUSCULAR | Status: DC | PRN
  Administered 2013-07-22 (×2): 50 ug via INTRAVENOUS
  Filled 2013-07-22 (×2): qty 2

## 2013-07-22 MED ORDER — IOHEXOL 300 MG/ML  SOLN
100.0000 mL | Freq: Once | INTRAMUSCULAR | Status: AC | PRN
  Administered 2013-07-22: 100 mL via INTRAVENOUS
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
## 2013-07-30 ENCOUNTER — Other Ambulatory Visit: Payer: Self-pay

## 2013-08-12 ENCOUNTER — Other Ambulatory Visit: Payer: Self-pay | Admitting: Gastroenterology

## 2013-08-12 DIAGNOSIS — K7460 Unspecified cirrhosis of liver: Secondary | ICD-10-CM

## 2013-08-12 DIAGNOSIS — C220 Liver cell carcinoma: Secondary | ICD-10-CM

## 2013-08-24 ENCOUNTER — Other Ambulatory Visit: Payer: Medicaid Other

## 2013-12-16 ENCOUNTER — Emergency Department (HOSPITAL_COMMUNITY): Payer: Medicaid Other

## 2013-12-16 ENCOUNTER — Encounter (HOSPITAL_COMMUNITY): Payer: Self-pay | Admitting: Emergency Medicine

## 2013-12-16 DIAGNOSIS — F411 Generalized anxiety disorder: Secondary | ICD-10-CM | POA: Insufficient documentation

## 2013-12-16 DIAGNOSIS — M25519 Pain in unspecified shoulder: Secondary | ICD-10-CM | POA: Insufficient documentation

## 2013-12-16 DIAGNOSIS — G43909 Migraine, unspecified, not intractable, without status migrainosus: Secondary | ICD-10-CM | POA: Insufficient documentation

## 2013-12-16 DIAGNOSIS — Z79899 Other long term (current) drug therapy: Secondary | ICD-10-CM | POA: Insufficient documentation

## 2013-12-16 DIAGNOSIS — J159 Unspecified bacterial pneumonia: Secondary | ICD-10-CM | POA: Insufficient documentation

## 2013-12-16 DIAGNOSIS — G2581 Restless legs syndrome: Secondary | ICD-10-CM | POA: Insufficient documentation

## 2013-12-16 DIAGNOSIS — I10 Essential (primary) hypertension: Secondary | ICD-10-CM | POA: Insufficient documentation

## 2013-12-16 DIAGNOSIS — Z8719 Personal history of other diseases of the digestive system: Secondary | ICD-10-CM | POA: Insufficient documentation

## 2013-12-16 DIAGNOSIS — G8929 Other chronic pain: Secondary | ICD-10-CM | POA: Insufficient documentation

## 2013-12-16 DIAGNOSIS — IMO0002 Reserved for concepts with insufficient information to code with codable children: Secondary | ICD-10-CM | POA: Insufficient documentation

## 2013-12-16 DIAGNOSIS — M19049 Primary osteoarthritis, unspecified hand: Secondary | ICD-10-CM | POA: Insufficient documentation

## 2013-12-16 DIAGNOSIS — Z8619 Personal history of other infectious and parasitic diseases: Secondary | ICD-10-CM | POA: Insufficient documentation

## 2013-12-16 DIAGNOSIS — Z8701 Personal history of pneumonia (recurrent): Secondary | ICD-10-CM | POA: Insufficient documentation

## 2013-12-16 DIAGNOSIS — F17200 Nicotine dependence, unspecified, uncomplicated: Secondary | ICD-10-CM | POA: Insufficient documentation

## 2013-12-16 DIAGNOSIS — Z765 Malingerer [conscious simulation]: Secondary | ICD-10-CM | POA: Insufficient documentation

## 2013-12-16 DIAGNOSIS — Z87828 Personal history of other (healed) physical injury and trauma: Secondary | ICD-10-CM | POA: Insufficient documentation

## 2013-12-16 LAB — BASIC METABOLIC PANEL
BUN: 10 mg/dL (ref 6–23)
CALCIUM: 8.9 mg/dL (ref 8.4–10.5)
CO2: 22 meq/L (ref 19–32)
Chloride: 99 mEq/L (ref 96–112)
Creatinine, Ser: 0.54 mg/dL (ref 0.50–1.35)
GFR calc Af Amer: >90 mL/min (ref >90)
GFR calc non Af Amer: >90 mL/min (ref >90)
Glucose, Bld: 100 mg/dL — ABNORMAL HIGH (ref 70–99)
Potassium: 3.6 mEq/L — ABNORMAL LOW (ref 3.7–5.3)
SODIUM: 134 meq/L — AB (ref 137–147)

## 2013-12-16 LAB — I-STAT CHEM 8, ED
BUN: 8 mg/dL (ref 6–23)
CHLORIDE: 102 meq/L (ref 96–112)
CREATININE: 0.5 mg/dL (ref 0.50–1.35)
Calcium, Ion: 1.19 mmol/L (ref 1.12–1.23)
Glucose, Bld: 98 mg/dL (ref 70–99)
HCT: 39 % (ref 39.0–52.0)
Hemoglobin: 13.3 g/dL (ref 13.0–17.0)
POTASSIUM: 3.5 meq/L — AB (ref 3.7–5.3)
SODIUM: 138 meq/L (ref 137–147)
TCO2: 21 mmol/L (ref 0–100)

## 2013-12-16 LAB — CBC WITH DIFFERENTIAL/PLATELET
Basophils Absolute: 0 10*3/uL (ref 0.0–0.1)
Basophils Relative: 0 % (ref 0–1)
Eosinophils Absolute: 0.1 10*3/uL (ref 0.0–0.7)
Eosinophils Relative: 2 % (ref 0–5)
HCT: 37.3 % — ABNORMAL LOW (ref 39.0–52.0)
Hemoglobin: 13.4 g/dL (ref 13.0–17.0)
LYMPHS ABS: 0.8 10*3/uL (ref 0.7–4.0)
LYMPHS PCT: 16 % (ref 12–46)
MCH: 30.4 pg (ref 26.0–34.0)
MCHC: 35.9 g/dL (ref 30.0–36.0)
MCV: 84.6 fL (ref 78.0–100.0)
Monocytes Absolute: 0.7 10*3/uL (ref 0.1–1.0)
Monocytes Relative: 13 % — ABNORMAL HIGH (ref 3–12)
NEUTROS ABS: 3.5 10*3/uL (ref 1.7–7.7)
NEUTROS PCT: 69 % (ref 43–77)
PLATELETS: 80 10*3/uL — AB (ref 150–400)
RBC: 4.41 MIL/uL (ref 4.22–5.81)
RDW: 15.2 % (ref 11.5–15.5)
WBC: 5.1 10*3/uL (ref 4.0–10.5)

## 2013-12-16 LAB — I-STAT TROPONIN, ED: Troponin i, poc: 0 ng/mL (ref 0.00–0.08)

## 2013-12-16 NOTE — ED Notes (Signed)
Patient states at this time that he has been having right sided chest pain with this right arm pain.

## 2013-12-16 NOTE — ED Notes (Signed)
Patient with right shoulder pain and right arm weakness.  Symptoms started 3 days ago.  Patient has full range of motion of arms, denies any injury to the arm.  No slurred speech, no facial droop.  Patient does have some weakness of right grip.

## 2013-12-16 NOTE — ED Notes (Signed)
Updated on delay Pt states that he cant take the pain anylonger and wants to know when he will be seen

## 2013-12-17 ENCOUNTER — Emergency Department (HOSPITAL_COMMUNITY)
Admission: EM | Admit: 2013-12-17 | Discharge: 2013-12-17 | Disposition: A | Discharge status: Home / Self Care | Payer: Medicaid Other | Attending: Emergency Medicine | Admitting: Emergency Medicine

## 2013-12-17 DIAGNOSIS — M5410 Radiculopathy, site unspecified: Secondary | ICD-10-CM

## 2013-12-17 DIAGNOSIS — J189 Pneumonia, unspecified organism: Secondary | ICD-10-CM

## 2013-12-17 DIAGNOSIS — M79603 Pain in arm, unspecified: Secondary | ICD-10-CM

## 2013-12-17 MED ORDER — OXYCODONE HCL 5 MG PO TABS
10.0000 mg | ORAL_TABLET | ORAL | Status: AC
  Administered 2013-12-17: 10 mg via ORAL
  Filled 2013-12-17: qty 2

## 2013-12-17 MED ORDER — AZITHROMYCIN 250 MG PO TABS: ORAL_TABLET | ORAL | Status: DC

## 2013-12-17 MED ORDER — AZITHROMYCIN 250 MG PO TABS
500.0000 mg | ORAL_TABLET | Freq: Once | ORAL | Status: AC
  Administered 2013-12-17: 500 mg via ORAL
  Filled 2013-12-17: qty 2

## 2013-12-17 NOTE — ED Provider Notes (Signed)
CSN: [id number]     Arrival date & time 12/16/13  2019 History   First MD Initiated Contact with Patient 12/17/13 0142     Chief Complaint  Patient presents with  . Chest Pain  . Shoulder Pain  . Weakness     (Consider location/radiation/quality/duration/timing/severity/associated sxs/prior Treatment) HPI Patient is a 53 year old man with multiple chronic medical problems including chronic pain, alcoholic liver cirrhosis, hepatitis B and C. He is here with complaints of right shoulder pain radiating into his right upper arm. The pain is aching. Present for several days. The patient said that he had been taking oxycodone 10 mg tablets for his pain. However, he says he ran out 2 days ago.  As an aside, it is interesting that the  [place] controlled substance reporting system displays that the patient was prescribed #20 oxycodone 5mg  tabs on 3/23 and #120 oxycodone 10mg  tabs on 3/18.   Patient says his pain is 10/10, he can't stand it. Denies history of trauma or strain. Hurts to elevate/abduct the right shoulder beyond 45 degrees. NO paresthesias or motor weakness.   No cough or SOB. No CP.   Past Medical History  Diagnosis Date  . Hepatic cirrhosis   . Hypertension     NO LONGER TAKING B/P MEDS--STATES DISCONTINUED BY HIS DOCTOR  . Anxiety   . Shortness of breath   . GERD (gastroesophageal reflux disease)   . Restless leg syndrome   . Complication of anesthesia     ENDO/COLONOSCOPY ATTEMPTED IN THE PAST-UNABLE TO ADEQUATELY SEDATE PT-TOLD WOULD NEED ANESTHESIA FOR FUTURE PROCEDURES  . Pneumonia 09-2012  . Headache(784.0)     HX OF MIGRAINES  . Arthritis     hands  . Hepatitis B 01-30-13    liver enzymes remains elevated  . Hepatitis C   . Wound healing, delayed 01-30-13    3-22-'14 s/p I&D right ankle surgery(infection) -minimal drainage with dressing  . 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 Syncope 03/09/2013   Past Surgical History  Procedure  Laterality Date  . Appendectomy    . Esophageal banding      FOR TX OF GI BLEED ABOUT 2008  . Arm debridement  08-11-12    right forearm wound with debridement 2 months ago/ right abdomen also-healing well  . Multiple tooth extractions  08/12/2012  . Colonoscopy with propofol  08/13/2012    Procedure: COLONOSCOPY WITH PROPOFOL;  Surgeon: Arta Silence, MD;  Location: [facility] ENDOSCOPY;  Service: Endoscopy;  Laterality: N/A;  . 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 Esophagogastroduodenoscopy N/A 11/18/2012    Procedure: ESOPHAGOGASTRODUODENOSCOPY (EGD);  Surgeon: Lear Ng, MD;  Location: [facility] ENDOSCOPY;  Service: Endoscopy;  Laterality: N/A;  . Esophageal banding  11/18/2012    Procedure: ESOPHAGEAL BANDING;  Surgeon: Lear Ng, MD;  Location: [name];  Service: Endoscopy;;  . I&d extremity Right 12/13/2012    Procedure: IRRIGATION AND DEBRIDEMENT EXTREMITY;  Surgeon: Mauri Pole, MD;  Location: [facility];  Service: Orthopedics;  Laterality: Right;  . Ankle surgery      Debridement of ankle 12-13-12-[place]/ Dr. Zigmund Daniel  . Peripherally inserted central catheter insertion      insertion and removal(IVR)  . Esophagogastroduodenoscopy N/A 02/03/2013    Procedure: ESOPHAGOGASTRODUODENOSCOPY (EGD);  Surgeon: Lear Ng, MD;  Location: [facility] ENDOSCOPY;  Service: Endoscopy;  Laterality: N/A;  . Esophagogastroduodenoscopy N/A 03/07/2013  Procedure: ESOPHAGOGASTRODUODENOSCOPY (EGD);  Surgeon: Jeryl Columbia, MD;  Location: [facility] ENDOSCOPY;  Service: Endoscopy;  Laterality: N/A;  . Esophageal banding  03/07/2013    Procedure: ESOPHAGEAL BANDING;  Surgeon: Jeryl Columbia, MD;  Location: [facility] ENDOSCOPY;  Service: Endoscopy;;   History reviewed. No pertinent family history. History  Substance Use Topics  . Smoking status: Current Every Day Smoker -- 0.50 packs/day for 30 years    Types: Cigarettes  . Smokeless tobacco: Never Used  . Alcohol Use: No      Comment: RARELY USES ALCOHOL    Review of Systems   Ten point review of symptoms performed and is negative with the exception of symptoms noted above.   Allergies  Tylenol; Asa; Morphine and related; and Tramadol  Home Medications   Current Outpatient Rx  Name  Route  Sig  Dispense  Refill  . clonazePAM (KLONOPIN) 0.5 MG tablet   Oral   Take 1.5 mg by mouth at bedtime.         Marland Kitchen oxyCODONE (OXY IR/ROXICODONE) 5 MG immediate release tablet   Oral   Take 10 mg by mouth every 4 (four) hours as needed for moderate pain. For pain         . propranolol (INDERAL) 10 MG tablet   Oral   Take 1 tablet (10 mg total) by mouth 3 (three) times daily.   90 tablet   0   . albuterol (PROVENTIL HFA;VENTOLIN HFA) 108 (90 BASE) MCG/ACT inhaler   Inhalation   Inhale 1-2 puffs into the lungs every 4 (four) hours as needed for wheezing. For wheezing         . azithromycin (ZITHROMAX Z-PAK) 250 MG tablet      1 po qd x 4 days.   4 tablet   0    BP 149/75  Pulse 82  Temp(Src) 98.9 F (37.2 C) (Oral)  Resp 18  SpO2 99% Physical Exam Gen: well developed and well nourished appearing Head: NCAT Eyes: PERL, EOMI Nose: no epistaixis or rhinorrhea Mouth/throat: mucosa is moist and pink Neck: supple, no stridor Lungs: CTA B, no wheezing, rhonchi or rales CV: RRR, no murmur, extremities appear well perfused.  Abd: soft, notender, nondistended Back: no ttp, no cva ttp Skin: warm and dry Ext: normal to inspection, no dependent edema Neuro: CN ii-xii grossly intact, no focal deficits Psyche; normal affect,  calm and cooperative.   ED Course  Procedures (including critical care time) Labs Review  Results for orders placed during the hospital encounter of 12/17/13 (from the past 24 hour(s))  CBC WITH DIFFERENTIAL     Status: Abnormal   Collection Time    12/16/13  8:32 PM      Result Value Ref Range   WBC 5.1  4.0 - 10.5 K/uL   RBC 4.41  4.22 - 5.81 MIL/uL   Hemoglobin 13.4   13.0 - 17.0 g/dL   HCT 37.3 (*) 39.0 - 52.0 %   MCV 84.6  78.0 - 100.0 fL   MCH 30.4  26.0 - 34.0 pg   MCHC 35.9  30.0 - 36.0 g/dL   RDW 15.2  11.5 - 15.5 %   Platelets 80 (*) 150 - 400 K/uL   Neutrophils Relative % 69  43 - 77 %   Neutro Abs 3.5  1.7 - 7.7 K/uL   Lymphocytes Relative 16  12 - 46 %   Lymphs Abs 0.8  0.7 - 4.0 K/uL   Monocytes Relative 13 (*)  3 - 12 %   Monocytes Absolute 0.7  0.1 - 1.0 K/uL   Eosinophils Relative 2  0 - 5 %   Eosinophils Absolute 0.1  0.0 - 0.7 K/uL   Basophils Relative 0  0 - 1 %   Basophils Absolute 0.0  0.0 - 0.1 K/uL  BASIC METABOLIC PANEL     Status: Abnormal   Collection Time    12/16/13  8:32 PM      Result Value Ref Range   Sodium 134 (*) 137 - 147 mEq/L   Potassium 3.6 (*) 3.7 - 5.3 mEq/L   Chloride 99  96 - 112 mEq/L   CO2 22  19 - 32 mEq/L   Glucose, Bld 100 (*) 70 - 99 mg/dL   BUN 10  6 - 23 mg/dL   Creatinine, Ser 0.54  0.50 - 1.35 mg/dL   Calcium 8.9  8.4 - 10.5 mg/dL   GFR calc non Af Amer >90  >90 mL/min   GFR calc Af Amer >90  >90 mL/min  I-STAT TROPOININ, ED     Status: None   Collection Time    12/16/13  8:59 PM      Result Value Ref Range   Troponin i, poc 0.00  0.00 - 0.08 ng/mL   Comment 3           I-STAT CHEM 8, ED     Status: Abnormal   Collection Time    12/16/13  9:01 PM      Result Value Ref Range   Sodium 138  137 - 147 mEq/L   Potassium 3.5 (*) 3.7 - 5.3 mEq/L   Chloride 102  96 - 112 mEq/L   BUN 8  6 - 23 mg/dL   Creatinine, Ser 0.50  0.50 - 1.35 mg/dL   Glucose, Bld 98  70 - 99 mg/dL   Calcium, Ion 1.19  1.12 - 1.23 mmol/L   TCO2 21  0 - 100 mmol/L   Hemoglobin 13.3  13.0 - 17.0 g/dL   HCT 39.0  39.0 - 52.0 %     Imaging Review Dg Chest 2 View  12/16/2013   CLINICAL DATA:  Chest and shoulder pain  EXAM: CHEST  2 VIEW  COMPARISON:  11-06-2012  FINDINGS: Cardiac shadow is within normal limits. The lungs are well aerated bilaterally. Very minimal right lower lobe infiltrate is seen. No new focal  bony abnormality is noted.  IMPRESSION: Minimal right basilar infiltrate   Electronically Signed   By: Inez Catalina M.D.   On: 12/16/2013 21:41   Dg Shoulder Right  12/16/2013   CLINICAL DATA:  Right shoulder pain  EXAM: RIGHT SHOULDER - 2+ VIEW  COMPARISON:  None.  FINDINGS: There is no evidence of fracture or dislocation. There is no evidence of arthropathy or other focal bone abnormality. Soft tissues are unremarkable.  IMPRESSION: No acute abnormality is noted.   Electronically Signed   By: Inez Catalina M.D.   On: 12/16/2013 21:41   EKG: nsr, no acute ischemic changes, normal intervals, normal axis, normal qrs complex   MDM   Final diagnoses:  Arm pain  Radiculopathy  Community acquired pneumonia   Patient exhibits opiate seeking behavior and his history is not consistent with factual information obtained from the [place] Controlled Substance Reporting System. His CXR is notable for a minimal right basilar infiltrate and, although he is without pulmonary sx, he could be experiencing referred pain from this. We will tx with Zpack -  first dose in ED. Patient told he will not be prescribed an opiate and he asked to see another doctor.     Elyn Peers, MD 12/17/13 [phone]

## 2014-02-21 ENCOUNTER — Encounter (HOSPITAL_COMMUNITY): Payer: Self-pay | Admitting: Emergency Medicine

## 2014-02-21 ENCOUNTER — Emergency Department (HOSPITAL_COMMUNITY): Payer: Medicaid Other

## 2014-02-21 ENCOUNTER — Emergency Department (HOSPITAL_COMMUNITY)
Admission: EM | Admit: 2014-02-21 | Discharge: 2014-02-21 | Disposition: A | Discharge status: Other Acute Inpatient Hospital | Payer: Medicaid Other | Attending: Emergency Medicine | Admitting: Emergency Medicine

## 2014-02-21 DIAGNOSIS — G2581 Restless legs syndrome: Secondary | ICD-10-CM | POA: Insufficient documentation

## 2014-02-21 DIAGNOSIS — R0602 Shortness of breath: Secondary | ICD-10-CM | POA: Insufficient documentation

## 2014-02-21 DIAGNOSIS — R55 Syncope and collapse: Secondary | ICD-10-CM | POA: Insufficient documentation

## 2014-02-21 DIAGNOSIS — R6883 Chills (without fever): Secondary | ICD-10-CM | POA: Insufficient documentation

## 2014-02-21 DIAGNOSIS — S0570XA Avulsion of unspecified eye, initial encounter: Secondary | ICD-10-CM | POA: Insufficient documentation

## 2014-02-21 DIAGNOSIS — R51 Headache: Secondary | ICD-10-CM | POA: Insufficient documentation

## 2014-02-21 DIAGNOSIS — Y849 Medical procedure, unspecified as the cause of abnormal reaction of the patient, or of later complication, without mention of misadventure at the time of the procedure: Secondary | ICD-10-CM | POA: Insufficient documentation

## 2014-02-21 DIAGNOSIS — I809 Phlebitis and thrombophlebitis of unspecified site: Secondary | ICD-10-CM | POA: Insufficient documentation

## 2014-02-21 DIAGNOSIS — K7460 Unspecified cirrhosis of liver: Secondary | ICD-10-CM | POA: Insufficient documentation

## 2014-02-21 DIAGNOSIS — B1910 Unspecified viral hepatitis B without hepatic coma: Secondary | ICD-10-CM | POA: Insufficient documentation

## 2014-02-21 DIAGNOSIS — B1920 Unspecified viral hepatitis C without hepatic coma: Secondary | ICD-10-CM | POA: Insufficient documentation

## 2014-02-21 DIAGNOSIS — Z888 Allergy status to other drugs, medicaments and biological substances status: Secondary | ICD-10-CM | POA: Insufficient documentation

## 2014-02-21 DIAGNOSIS — K219 Gastro-esophageal reflux disease without esophagitis: Secondary | ICD-10-CM | POA: Insufficient documentation

## 2014-02-21 DIAGNOSIS — Z792 Long term (current) use of antibiotics: Secondary | ICD-10-CM | POA: Insufficient documentation

## 2014-02-21 DIAGNOSIS — F411 Generalized anxiety disorder: Secondary | ICD-10-CM | POA: Insufficient documentation

## 2014-02-21 DIAGNOSIS — M542 Cervicalgia: Secondary | ICD-10-CM | POA: Insufficient documentation

## 2014-02-21 DIAGNOSIS — T07XXXA Unspecified multiple injuries, initial encounter: Secondary | ICD-10-CM | POA: Insufficient documentation

## 2014-02-21 DIAGNOSIS — L089 Local infection of the skin and subcutaneous tissue, unspecified: Secondary | ICD-10-CM

## 2014-02-21 DIAGNOSIS — I10 Essential (primary) hypertension: Secondary | ICD-10-CM | POA: Insufficient documentation

## 2014-02-21 DIAGNOSIS — Z885 Allergy status to narcotic agent status: Secondary | ICD-10-CM | POA: Insufficient documentation

## 2014-02-21 DIAGNOSIS — M129 Arthropathy, unspecified: Secondary | ICD-10-CM | POA: Insufficient documentation

## 2014-02-21 DIAGNOSIS — M519 Unspecified thoracic, thoracolumbar and lumbosacral intervertebral disc disorder: Secondary | ICD-10-CM | POA: Insufficient documentation

## 2014-02-21 DIAGNOSIS — F17200 Nicotine dependence, unspecified, uncomplicated: Secondary | ICD-10-CM | POA: Insufficient documentation

## 2014-02-21 DIAGNOSIS — Z8701 Personal history of pneumonia (recurrent): Secondary | ICD-10-CM | POA: Insufficient documentation

## 2014-02-21 DIAGNOSIS — Z79899 Other long term (current) drug therapy: Secondary | ICD-10-CM | POA: Insufficient documentation

## 2014-02-21 LAB — CBC WITH DIFFERENTIAL/PLATELET
BASOS ABS: 0 10*3/uL (ref 0.0–0.1)
Basophils Relative: 0 % (ref 0–1)
Eosinophils Absolute: 0.1 10*3/uL (ref 0.0–0.7)
Eosinophils Relative: 1 % (ref 0–5)
HCT: 36 % — ABNORMAL LOW (ref 39.0–52.0)
Hemoglobin: 12.4 g/dL — ABNORMAL LOW (ref 13.0–17.0)
Lymphocytes Relative: 11 % — ABNORMAL LOW (ref 12–46)
Lymphs Abs: 1.3 10*3/uL (ref 0.7–4.0)
MCH: 30 pg (ref 26.0–34.0)
MCHC: 34.4 g/dL (ref 30.0–36.0)
MCV: 87 fL (ref 78.0–100.0)
Monocytes Absolute: 0.9 10*3/uL (ref 0.1–1.0)
Monocytes Relative: 8 % (ref 3–12)
NEUTROS ABS: 9.1 10*3/uL — AB (ref 1.7–7.7)
Neutrophils Relative %: 80 % — ABNORMAL HIGH (ref 43–77)
Platelets: 65 10*3/uL — ABNORMAL LOW (ref 150–400)
RBC: 4.14 MIL/uL — ABNORMAL LOW (ref 4.22–5.81)
RDW: 17.4 % — AB (ref 11.5–15.5)
WBC: 11.5 10*3/uL — AB (ref 4.0–10.5)

## 2014-02-21 LAB — LACTIC ACID, PLASMA: LACTIC ACID, VENOUS: 1.1 mmol/L (ref 0.5–2.2)

## 2014-02-21 LAB — COMPREHENSIVE METABOLIC PANEL
ALT: 16 U/L (ref 0–53)
AST: 26 U/L (ref 0–37)
Albumin: 2.5 g/dL — ABNORMAL LOW (ref 3.5–5.2)
Alkaline Phosphatase: 96 U/L (ref 39–117)
BUN: 20 mg/dL (ref 6–23)
CO2: 20 meq/L (ref 19–32)
Calcium: 9.3 mg/dL (ref 8.4–10.5)
Chloride: 92 mEq/L — ABNORMAL LOW (ref 96–112)
Creatinine, Ser: 0.88 mg/dL (ref 0.50–1.35)
GFR calc Af Amer: >90 mL/min (ref >90)
Glucose, Bld: 141 mg/dL — ABNORMAL HIGH (ref 70–99)
POTASSIUM: 3.4 meq/L — AB (ref 3.7–5.3)
Sodium: 130 mEq/L — ABNORMAL LOW (ref 137–147)
Total Bilirubin: 1.7 mg/dL — ABNORMAL HIGH (ref 0.3–1.2)
Total Protein: 8.2 g/dL (ref 6.0–8.3)

## 2014-02-21 MED ORDER — HYDROMORPHONE HCL PF 1 MG/ML IJ SOLN
1.0000 mg | Freq: Once | INTRAMUSCULAR | Status: AC
  Administered 2014-02-21: 1 mg via INTRAVENOUS
  Filled 2014-02-21: qty 1

## 2014-02-21 MED ORDER — SODIUM CHLORIDE 0.9 % IV BOLUS (SEPSIS)
1000.0000 mL | Freq: Once | INTRAVENOUS | Status: AC
  Administered 2014-02-21: 1000 mL via INTRAVENOUS

## 2014-02-21 MED ORDER — ONDANSETRON HCL 4 MG/2ML IJ SOLN
4.0000 mg | Freq: Once | INTRAMUSCULAR | Status: AC
  Administered 2014-02-21: 4 mg via INTRAVENOUS
  Filled 2014-02-21: qty 2

## 2014-02-21 MED ORDER — IOHEXOL 300 MG/ML  SOLN
75.0000 mL | Freq: Once | INTRAMUSCULAR | Status: AC | PRN
  Administered 2014-02-21: 75 mL via INTRAVENOUS

## 2014-02-21 MED ORDER — FENTANYL CITRATE 0.05 MG/ML IJ SOLN
50.0000 ug | Freq: Once | INTRAMUSCULAR | Status: AC
  Administered 2014-02-21: 50 ug via INTRAVENOUS
  Filled 2014-02-21: qty 2

## 2014-02-21 NOTE — ED Notes (Signed)
01-15-14 s/p left anterior cervical fusion with plates.  Pt has PICC left upper arm infusing antibiotics.  Pt woke up this morning with left neck and upper left chest swelling and tenderness and little redness.  No respiratory difficulties.  Pt reports that left side of throat feels full and tender when swallowing

## 2014-02-21 NOTE — ED Notes (Signed)
[place] with [facility] air care called for report.

## 2014-02-21 NOTE — ED Notes (Addendum)
Vernell called back, advised to get with their xray department to find out how to access films since [place] at [facility] sent to them.

## 2014-02-21 NOTE — ED Notes (Signed)
Pt took battery out of IV pump to stop infusion.

## 2014-02-21 NOTE — ED Notes (Signed)
[facility] for report.  They will be getting ready to leave in few minutes.  ETA 1 hour.

## 2014-02-21 NOTE — ED Notes (Signed)
Mark Christensen @ [facility]

## 2014-02-21 NOTE — ED Notes (Signed)
Vernell @ [facility] called wanting a CD for CT results.  Called CT department Gerald Stabs will send to [facility] system.  Called Vernell back and advised.  Advised Vernell to pull up care everywhere and search for CT results from [facility].  She found the results.  If MD needs anything further she will call back.

## 2014-02-21 NOTE — ED Notes (Signed)
Pt states that he woke up with swelling to left neck and into shoulder.  Pt states that he is getting anbx  Via Pic[facility] from infection in his neck

## 2014-02-21 NOTE — ED Provider Notes (Signed)
Medical screening examination/treatment/procedure(s) were performed by non-physician practitioner and as supervising physician I was immediately available for consultation/collaboration.  Richarda Blade, MD 02/21/14 2020

## 2014-02-21 NOTE — ED Provider Notes (Signed)
CSN: [id number]     Arrival date & time 02/21/14  1408 History   First MD Initiated Contact with Patient 02/21/14 1538     Chief Complaint  Patient presents with  . Lymphadenopathy    left side of neck.     (Consider location/radiation/quality/duration/timing/severity/associated sxs/prior Treatment) HPI Mark Christensen is a 53 y.o. male who presents to emergency department complaining of swelling to left side of his neck. 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 Patient states he was doing well, was discharged on IV antibiotics at home, through a PICC line in his left arm. He states he has a home health nurse comments and helps him with this. He reports that yesterday he began having increased swelling in the left neck area. He states the area is tender and red. He denies any pain around his PICC line. He denies any pain in his left arm. He denies any new weakness or numbness in extremities. There is no pain around the incision or no new pain around his cervical spine. He states he had a fever 1 and 2 without 2 weeks ago, no fever recently. Denies any other complaints  Past Medical History  Diagnosis Date  . Hepatic cirrhosis   . Hypertension     NO LONGER TAKING B/P MEDS--STATES DISCONTINUED BY HIS DOCTOR  . Anxiety   . Shortness of breath   . GERD (gastroesophageal reflux disease)   . Restless leg syndrome   . Complication of anesthesia     ENDO/COLONOSCOPY ATTEMPTED IN THE PAST-UNABLE TO ADEQUATELY SEDATE PT-TOLD WOULD NEED ANESTHESIA FOR FUTURE PROCEDURES  . Pneumonia 09-2012  . Headache(784.0)     HX OF MIGRAINES  . Arthritis     hands  . Hepatitis B 01-30-13    liver enzymes remains elevated  . Hepatitis C   . Wound healing, delayed 01-30-13    3-22-'14 s/p I&D right ankle surgery(infection) -minimal drainage with dressing  . 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  Syncope 03/09/2013   Past Surgical History  Procedure Laterality Date  . Appendectomy    . Esophageal banding      FOR TX OF GI BLEED ABOUT 2008  . Arm debridement  08-11-12    right forearm wound with debridement 2 months ago/ right abdomen also-healing well  . Multiple tooth extractions  08/12/2012  . Colonoscopy with propofol  08/13/2012    Procedure: COLONOSCOPY WITH PROPOFOL;  Surgeon: Arta Silence, MD;  Location: [facility] ENDOSCOPY;  Service: Endoscopy;  Laterality: N/A;  . 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 Esophagogastroduodenoscopy N/A 11/18/2012    Procedure: ESOPHAGOGASTRODUODENOSCOPY (EGD);  Surgeon: Lear Ng, MD;  Location: [facility] ENDOSCOPY;  Service: Endoscopy;  Laterality: N/A;  . Esophageal banding  11/18/2012    Procedure: ESOPHAGEAL BANDING;  Surgeon: Lear Ng, MD;  Location: [place];  Service: Endoscopy;;  . I&d extremity Right 12/13/2012    Procedure: IRRIGATION AND DEBRIDEMENT EXTREMITY;  Surgeon: Mauri Pole, MD;  Location: [place];  Service: Orthopedics;  Laterality: Right;  . Ankle surgery      Debridement of ankle 12-13-12-[place]/ Dr. Zigmund Daniel  . Peripherally inserted central catheter insertion      insertion and removal(IVR)  . Esophagogastroduodenoscopy N/A 02/03/2013    Procedure: ESOPHAGOGASTRODUODENOSCOPY (EGD);  Surgeon: Lear Ng, MD;  Location: [facility]  ENDOSCOPY;  Service: Endoscopy;  Laterality: N/A;  . Esophagogastroduodenoscopy N/A 03/07/2013    Procedure: ESOPHAGOGASTRODUODENOSCOPY (EGD);  Surgeon: Jeryl Columbia, MD;  Location: [facility] ENDOSCOPY;  Service: Endoscopy;  Laterality: N/A;  . Esophageal banding  03/07/2013    Procedure: ESOPHAGEAL BANDING;  Surgeon: Jeryl Columbia, MD;  Location: [facility] ENDOSCOPY;  Service: Endoscopy;;   No family history on file. History  Substance Use Topics  . Smoking status: Current Every Day Smoker -- 0.50 packs/day for 30 years    Types: Cigarettes  . Smokeless tobacco:  Never Used  . Alcohol Use: No     Comment: RARELY USES ALCOHOL    Review of Systems  Constitutional: Positive for chills. Negative for fever.  Respiratory: Negative for cough, chest tightness and shortness of breath.   Cardiovascular: Negative for chest pain, palpitations and leg swelling.  Gastrointestinal: Negative for nausea, vomiting, abdominal pain, diarrhea and abdominal distention.  Genitourinary: Negative for dysuria, urgency, frequency and hematuria.  Musculoskeletal: Positive for neck pain. Negative for arthralgias, myalgias and neck stiffness.  Skin: Negative for rash.  Allergic/Immunologic: Negative for immunocompromised state.  Neurological: Negative for dizziness, weakness, light-headedness, numbness and headaches.      Allergies  Tylenol; Asa; Morphine and related; and Tramadol  Home Medications   Prior to Admission medications   Medication Sig Start Date End Date Taking? Authorizing Provider  amitriptyline (ELAVIL) 25 MG tablet Take 25 mg by mouth at bedtime.   Yes Historical Provider, MD  clonazePAM (KLONOPIN) 0.5 MG tablet Take 0.5 mg by mouth at bedtime.   Yes Historical Provider, MD  FLUoxetine (PROZAC) 40 MG capsule Take 40 mg by mouth daily.   Yes Historical Provider, MD  nafcillin 2 g in dextrose 5 % 50 mL Inject 2 g into the vein every other day.   Yes Historical Provider, MD  oxyCODONE (ROXICODONE) 15 MG immediate release tablet Take 15 mg by mouth every 4 (four) hours as needed for pain.   Yes Historical Provider, MD  propranolol (INDERAL) 10 MG tablet Take 1 tablet (10 mg total) by mouth 3 (three) times daily. 03/11/13  Yes Jerene Pitch, MD  rifampin (RIFADIN) 300 MG capsule Take 600 mg by mouth daily.   Yes Historical Provider, MD  sennosides-docusate sodium (SENOKOT-S) 8.6-50 MG tablet Take 2 tablets by mouth 2 (two) times daily.   Yes Historical Provider, MD   BP 123/81  Pulse 116  Temp(Src) 98.2 F (36.8 C) (Oral)  Resp 22  SpO2 97% Physical  Exam  Nursing note and vitals reviewed. Constitutional: He appears well-developed and well-nourished. No distress.  HENT:  Head: Normocephalic and atraumatic.  Eyes: Conjunctivae are normal.  Neck:  Swelling noted to the left anterior neck extending into the left periclavicular area. Mild erythema over the skin surrounding swelling. Tender to palpation.  Cardiovascular: Regular rhythm and normal heart sounds.   tachycardic  Pulmonary/Chest: Effort normal. No respiratory distress. He has no wheezes. He has no rales.  Abdominal: Soft. Bowel sounds are normal. He exhibits no distension. There is no tenderness. There is no rebound.  Musculoskeletal: He exhibits no edema.  PICC line in the left upper arm, area appears to be normal with no signs of erythema or inflammation. Nontender. Distal radial pulses the left arm intact.  Neurological: He is alert.  Skin: Skin is warm and dry.    ED Course  Procedures (including critical care time) Labs Review Labs Reviewed  CBC WITH DIFFERENTIAL - Abnormal; Notable for the following:  WBC 11.5 (*)    RBC 4.14 (*)    Hemoglobin 12.4 (*)    HCT 36.0 (*)    RDW 17.4 (*)    Platelets 65 (*)    Neutrophils Relative % 80 (*)    Neutro Abs 9.1 (*)    Lymphocytes Relative 11 (*)    All other components within normal limits  COMPREHENSIVE METABOLIC PANEL - Abnormal; Notable for the following:    Sodium 130 (*)    Potassium 3.4 (*)    Chloride 92 (*)    Glucose, Bld 141 (*)    Albumin 2.5 (*)    Total Bilirubin 1.7 (*)    All other components within normal limits  CULTURE, BLOOD (ROUTINE X 2)  CULTURE, BLOOD (ROUTINE X 2)  LACTIC ACID, PLASMA    Imaging Review No results found.   EKG Interpretation None      MDM   Final diagnoses:  Neck infection  Septic thrombophlebitis    Pt with recent C6-7 osteomyelitis, diskitis, epidural abscess. Surgirical resection a month ago, on IV antibiotics at home. Pt does not appear to be septic,  afebrile, mildly tachcyardic, normal BP.  Will get labs, blood cultures, CT of the neck.   7:13 PM CT as described above. I contacted and spoke with Dr. Octavio Manns at [facility], who was pt's surgeon, will transfer to [facility] ED for further evaluation. Asked if they would want [place] to pull the PICC, advised not to at this time. Pt will be transferred over.   Renold Genta, PA-C 02/21/14 1951

## 2014-02-23 ENCOUNTER — Telehealth (HOSPITAL_BASED_OUTPATIENT_CLINIC_OR_DEPARTMENT_OTHER): Payer: Self-pay | Admitting: Emergency Medicine

## 2014-02-23 NOTE — Progress Notes (Signed)
ED Antimicrobial Stewardship Positive Culture Follow Up   Mark Christensen is an 53 y.o. male who presented to [facility] on 02/21/2014 with a chief complaint of  Chief Complaint  Patient presents with  . Lymphadenopathy    left side of neck.    Recent Results (from the past 720 hour(s))  CULTURE, BLOOD (ROUTINE X 2)     Status: None   Collection Time    02/21/14  4:28 PM      Result Value Ref Range Status   Specimen Description BLOOD RIGHT ARM   Final   Special Requests BOTTLES DRAWN AEROBIC AND ANAEROBIC 10CC   Final   Culture  Setup Time     Final   Value: 02/21/2014 21:46     Performed at [facility]   Culture     Final   Value: YEAST     Note: Gram Stain Report Called to,Read Back By and Verified With: Spencer ON 02/22/14 AT 10:48P.M. EUMPN     Performed at [facility]   Report Status PENDING   Incomplete  CULTURE, BLOOD (ROUTINE X 2)     Status: None   Collection Time    02/21/14  4:36 PM      Result Value Ref Range Status   Specimen Description BLOOD RIGHT HAND   Final   Special Requests BOTTLES DRAWN AEROBIC AND ANAEROBIC 10CC   Final   Culture  Setup Time     Final   Value: 02/21/2014 21:45     Performed at [facility]   Culture     Final   Value:        BLOOD CULTURE RECEIVED NO GROWTH TO DATE CULTURE WILL BE HELD FOR 5 DAYS BEFORE ISSUING A FINAL NEGATIVE REPORT     Performed at [facility]   Report Status PENDING   Incomplete    [x]  Treated with rifampin   Patient presented to the ED with CC of lymphadenopathy. He has a recent (1 month ago) history of discitis and osteomyelitis which he is receiving long-term antibiotics through a PICC line. Culture report came back positive for yeast in the blood. Patient to be transferred to [facility] per note. Follow-up if patient transferred to [facility] and fax them the culture report.    ED Provider: Quincy Carnes, PA   [name] 02/23/2014, 9:39  AM Infectious Diseases Pharmacist Phone# [phone]

## 2014-02-23 NOTE — Telephone Encounter (Signed)
As per noted on culture result, these results were called to [facility] already on 02/22/2014. No need to fax results.

## 2014-02-23 NOTE — Telephone Encounter (Signed)
Post ED Visit - Positive Culture Follow-up: Successful Patient Follow-Up  Culture assessed and recommendations reviewed by: []  Wes Farley, Pharm.D., BCPS []  Heide Guile, Pharm.D., BCPS []  Alycia Rossetti, Pharm.D., BCPS [x]  [place], Pharm.D., BCPS, AAHIVP []  Legrand Como, Pharm.D., BCPS, AAHIVP  Positive blood culture  []  Patient discharged without antimicrobial prescription and treatment is now indicated []  Organism is resistant to prescribed ED discharge antimicrobial [x]  Patient with positive blood cultures  Changes discussed with ED provider: Quincy Carnes PA-C New treatment plan: Patient transferred to [facility]. Fax cultures to [facility] 02/23/2014, 1:09 PM

## 2014-02-25 LAB — CULTURE, BLOOD (ROUTINE X 2)

## 2014-02-27 LAB — CULTURE, BLOOD (ROUTINE X 2): CULTURE: NO GROWTH

## 2014-02-28 ENCOUNTER — Encounter (HOSPITAL_COMMUNITY): Payer: Self-pay | Admitting: Emergency Medicine

## 2014-02-28 ENCOUNTER — Inpatient Hospital Stay (HOSPITAL_COMMUNITY)
Admission: EM | Admit: 2014-02-28 | Discharge: 2014-03-01 | DRG: 391 | Discharge status: Against Medical Advice | Payer: Medicaid Other | Attending: Internal Medicine | Admitting: Internal Medicine

## 2014-02-28 DIAGNOSIS — K7460 Unspecified cirrhosis of liver: Secondary | ICD-10-CM | POA: Diagnosis present

## 2014-02-28 DIAGNOSIS — B1910 Unspecified viral hepatitis B without hepatic coma: Secondary | ICD-10-CM | POA: Diagnosis present

## 2014-02-28 DIAGNOSIS — K920 Hematemesis: Secondary | ICD-10-CM | POA: Diagnosis present

## 2014-02-28 DIAGNOSIS — M5090 Cervical disc disorder, unspecified, unspecified cervical region: Secondary | ICD-10-CM | POA: Diagnosis present

## 2014-02-28 DIAGNOSIS — K922 Gastrointestinal hemorrhage, unspecified: Secondary | ICD-10-CM | POA: Diagnosis present

## 2014-02-28 DIAGNOSIS — B377 Candidal sepsis: Secondary | ICD-10-CM | POA: Diagnosis present

## 2014-02-28 DIAGNOSIS — I8510 Secondary esophageal varices without bleeding: Secondary | ICD-10-CM | POA: Diagnosis present

## 2014-02-28 DIAGNOSIS — I10 Essential (primary) hypertension: Secondary | ICD-10-CM | POA: Diagnosis present

## 2014-02-28 DIAGNOSIS — F17200 Nicotine dependence, unspecified, uncomplicated: Secondary | ICD-10-CM | POA: Diagnosis present

## 2014-02-28 DIAGNOSIS — F329 Major depressive disorder, single episode, unspecified: Secondary | ICD-10-CM

## 2014-02-28 DIAGNOSIS — B1920 Unspecified viral hepatitis C without hepatic coma: Secondary | ICD-10-CM | POA: Diagnosis present

## 2014-02-28 DIAGNOSIS — K766 Portal hypertension: Secondary | ICD-10-CM | POA: Diagnosis present

## 2014-02-28 DIAGNOSIS — I82B12 Acute embolism and thrombosis of left subclavian vein: Secondary | ICD-10-CM | POA: Diagnosis present

## 2014-02-28 DIAGNOSIS — K219 Gastro-esophageal reflux disease without esophagitis: Secondary | ICD-10-CM | POA: Diagnosis present

## 2014-02-28 DIAGNOSIS — Z79899 Other long term (current) drug therapy: Secondary | ICD-10-CM

## 2014-02-28 DIAGNOSIS — M4642 Discitis, unspecified, cervical region: Secondary | ICD-10-CM | POA: Diagnosis present

## 2014-02-28 DIAGNOSIS — G2581 Restless legs syndrome: Secondary | ICD-10-CM

## 2014-02-28 DIAGNOSIS — H5440 Blindness, one eye, unspecified eye: Secondary | ICD-10-CM | POA: Diagnosis present

## 2014-02-28 DIAGNOSIS — D689 Coagulation defect, unspecified: Secondary | ICD-10-CM

## 2014-02-28 DIAGNOSIS — I82890 Acute embolism and thrombosis of other specified veins: Secondary | ICD-10-CM

## 2014-02-28 DIAGNOSIS — F411 Generalized anxiety disorder: Secondary | ICD-10-CM | POA: Diagnosis present

## 2014-02-28 DIAGNOSIS — I808 Phlebitis and thrombophlebitis of other sites: Secondary | ICD-10-CM

## 2014-02-28 DIAGNOSIS — K319 Disease of stomach and duodenum, unspecified: Principal | ICD-10-CM | POA: Diagnosis present

## 2014-02-28 DIAGNOSIS — M869 Osteomyelitis, unspecified: Secondary | ICD-10-CM

## 2014-02-28 DIAGNOSIS — F3289 Other specified depressive episodes: Secondary | ICD-10-CM | POA: Diagnosis present

## 2014-02-28 DIAGNOSIS — I82B19 Acute embolism and thrombosis of unspecified subclavian vein: Secondary | ICD-10-CM | POA: Diagnosis present

## 2014-02-28 DIAGNOSIS — I8500 Esophageal varices without bleeding: Secondary | ICD-10-CM | POA: Insufficient documentation

## 2014-02-28 LAB — CBC
HCT: 34.3 % — ABNORMAL LOW (ref 39.0–52.0)
HEMOGLOBIN: 11.4 g/dL — AB (ref 13.0–17.0)
MCH: 29.4 pg (ref 26.0–34.0)
MCHC: 33.2 g/dL (ref 30.0–36.0)
MCV: 88.4 fL (ref 78.0–100.0)
Platelets: 135 10*3/uL — ABNORMAL LOW (ref 150–400)
RBC: 3.88 MIL/uL — ABNORMAL LOW (ref 4.22–5.81)
RDW: 17.3 % — ABNORMAL HIGH (ref 11.5–15.5)
WBC: 7.6 10*3/uL (ref 4.0–10.5)

## 2014-02-28 LAB — I-STAT CHEM 8, ED
BUN: 20 mg/dL (ref 6–23)
CALCIUM ION: 1.29 mmol/L — AB (ref 1.12–1.23)
Chloride: 98 mEq/L (ref 96–112)
Creatinine, Ser: 1.4 mg/dL — ABNORMAL HIGH (ref 0.50–1.35)
GLUCOSE: 112 mg/dL — AB (ref 70–99)
HCT: 35 % — ABNORMAL LOW (ref 39.0–52.0)
Hemoglobin: 11.9 g/dL — ABNORMAL LOW (ref 13.0–17.0)
Potassium: 3.8 mEq/L (ref 3.7–5.3)
Sodium: 140 mEq/L (ref 137–147)
TCO2: 24 mmol/L (ref 0–100)

## 2014-02-28 LAB — COMPREHENSIVE METABOLIC PANEL
ALBUMIN: 2.4 g/dL — AB (ref 3.5–5.2)
ALK PHOS: 79 U/L (ref 39–117)
ALT: 9 U/L (ref 0–53)
AST: 21 U/L (ref 0–37)
BUN: 19 mg/dL (ref 6–23)
CO2: 23 mEq/L (ref 19–32)
Calcium: 9.6 mg/dL (ref 8.4–10.5)
Chloride: 98 mEq/L (ref 96–112)
Creatinine, Ser: 1.18 mg/dL (ref 0.50–1.35)
GFR calc Af Amer: 82 mL/min — ABNORMAL LOW (ref >90)
GFR calc non Af Amer: 70 mL/min — ABNORMAL LOW (ref >90)
Glucose, Bld: 111 mg/dL — ABNORMAL HIGH (ref 70–99)
POTASSIUM: 3.8 meq/L (ref 3.7–5.3)
SODIUM: 134 meq/L — AB (ref 137–147)
TOTAL PROTEIN: 8.5 g/dL — AB (ref 6.0–8.3)
Total Bilirubin: 0.9 mg/dL (ref 0.3–1.2)

## 2014-02-28 LAB — TYPE AND SCREEN
ABO/RH(D): O POS
ANTIBODY SCREEN: NEGATIVE

## 2014-02-28 LAB — PROTIME-INR
INR: 1.25 (ref 0.00–1.49)
PROTHROMBIN TIME: 15.4 s — AB (ref 11.6–15.2)

## 2014-02-28 MED ORDER — RIFAMPIN 300 MG PO CAPS
600.0000 mg | ORAL_CAPSULE | Freq: Every day | ORAL | Status: DC
  Filled 2014-02-28: qty 2

## 2014-02-28 MED ORDER — PANTOPRAZOLE SODIUM 40 MG IV SOLR
40.0000 mg | Freq: Two times a day (BID) | INTRAVENOUS | Status: DC
Start: 2014-03-04 — End: 2014-03-01

## 2014-02-28 MED ORDER — HYDROMORPHONE HCL PF 1 MG/ML IJ SOLN: 1.0000 mg | INTRAMUSCULAR | Status: DC | PRN

## 2014-02-28 MED ORDER — OXYCODONE HCL 5 MG PO TABS
15.0000 mg | ORAL_TABLET | ORAL | Status: DC | PRN
  Administered 2014-03-01 (×3): 15 mg via ORAL
  Filled 2014-02-28 (×3): qty 3

## 2014-02-28 MED ORDER — HYDROMORPHONE HCL PF 1 MG/ML IJ SOLN: 0.5000 mg | INTRAMUSCULAR | Status: DC | PRN

## 2014-02-28 MED ORDER — SODIUM CHLORIDE 0.9 % IV SOLN
INTRAVENOUS | Status: DC
  Administered 2014-03-01: via INTRAVENOUS

## 2014-02-28 MED ORDER — SODIUM CHLORIDE 0.9 % IV SOLN
80.0000 mg | Freq: Once | INTRAVENOUS | Status: AC
  Administered 2014-02-28: 80 mg via INTRAVENOUS
  Filled 2014-02-28: qty 80

## 2014-02-28 MED ORDER — ONDANSETRON HCL 4 MG/2ML IJ SOLN
4.0000 mg | Freq: Once | INTRAMUSCULAR | Status: AC
  Administered 2014-02-28: 4 mg via INTRAVENOUS
  Filled 2014-02-28: qty 2

## 2014-02-28 MED ORDER — SODIUM CHLORIDE 0.9 % IV SOLN
8.0000 mg/h | INTRAVENOUS | Status: DC
  Administered 2014-02-28 – 2014-03-01 (×2): 8 mg/h via INTRAVENOUS
  Filled 2014-02-28 (×6): qty 80

## 2014-02-28 MED ORDER — SODIUM CHLORIDE 0.9 % IV SOLN
2.0000 g | INTRAVENOUS | Status: DC
  Administered 2014-03-01 (×3): 2 g via INTRAVENOUS
  Filled 2014-02-28 (×9): qty 2000

## 2014-02-28 MED ORDER — CLONAZEPAM 0.5 MG PO TABS
0.5000 mg | ORAL_TABLET | Freq: Every day | ORAL | Status: DC
  Administered 2014-03-01: 0.5 mg via ORAL
  Filled 2014-02-28: qty 1

## 2014-02-28 MED ORDER — AMITRIPTYLINE HCL 25 MG PO TABS
25.0000 mg | ORAL_TABLET | Freq: Every day | ORAL | Status: DC
  Administered 2014-03-01: 25 mg via ORAL
  Filled 2014-02-28 (×2): qty 1

## 2014-02-28 MED ORDER — OCTREOTIDE LOAD VIA INFUSION
25.0000 ug | Freq: Once | INTRAVENOUS | Status: AC
  Administered 2014-02-28: 25 ug via INTRAVENOUS
  Filled 2014-02-28: qty 13

## 2014-02-28 MED ORDER — FLUOXETINE HCL 40 MG PO CAPS
40.0000 mg | ORAL_CAPSULE | Freq: Every day | ORAL | Status: DC
  Filled 2014-02-28: qty 1

## 2014-02-28 MED ORDER — HYDROMORPHONE HCL PF 1 MG/ML IJ SOLN
1.0000 mg | INTRAMUSCULAR | Status: AC | PRN
  Administered 2014-02-28 (×2): 1 mg via INTRAVENOUS
  Filled 2014-02-28 (×2): qty 1

## 2014-02-28 MED ORDER — FLUCONAZOLE 200 MG PO TABS
600.0000 mg | ORAL_TABLET | Freq: Every day | ORAL | Status: DC
  Administered 2014-03-01: 600 mg via ORAL
  Filled 2014-02-28 (×2): qty 3

## 2014-02-28 MED ORDER — PROPRANOLOL HCL 10 MG PO TABS
10.0000 mg | ORAL_TABLET | Freq: Three times a day (TID) | ORAL | Status: DC
  Administered 2014-03-01: 10 mg via ORAL
  Filled 2014-02-28 (×4): qty 1

## 2014-02-28 MED ORDER — SODIUM CHLORIDE 0.9 % IV SOLN
25.0000 ug/h | INTRAVENOUS | Status: DC
  Administered 2014-02-28: 25 ug/h via INTRAVENOUS
  Filled 2014-02-28 (×3): qty 1

## 2014-02-28 NOTE — ED Notes (Addendum)
Report attempted 

## 2014-02-28 NOTE — ED Notes (Signed)
The pt has protonix drip cannot be started until the plasma  Is infused

## 2014-02-28 NOTE — H&P (Signed)
Date: 02/28/2014               Patient Name:  Mark Christensen MRN: [id number]  DOB: August 21, 1961 Age / Sex: 53 y.o., male   PCP: Leonard Downing, MD         Medical Service: Internal Medicine Teaching Service         Attending Physician: Dr. Bartholomew Crews, MD    First Contact: Dr. Johnnette Gourd, MD Pager: [phone]  Second Contact: Dr. Randell Loop, MD Pager: [phone]       After Hours (After 5p/  First Contact Pager: [phone]  weekends / holidays): Second Contact Pager: [phone]   Chief Complaint: Vomiting Blood  History of Present Illness: Mark Christensen is a 53 y.o. man with a pmhx of cirhossis c/b esophageal varices and multiple GI bleeds, recent cervical OM, discitis, and epidural abscess, recent picc line infection c/b septic thrombophlebitis who presents to the ED one day after being discharged from [facility] with a cc of vomiting blood. The patient was in his normal state of health until April when he developed worsening neck pain. He was found to have C6-7 discitis, OM and epidural abscess for which he underwent anterior C6-7 corpectomy and I&D on April 25th. Cultures were positive for MSSA. He was given a PICC line and discharged home with IV nafcillin and PO rifampin. He was planned to complete this course through June 20th. He was doing well at home on this regimen until late May when he developed a painful mass on the left side of his neck. He was admitted back to [facility] on May 31st with the diagnosis of septic thrombophlebitis c/b candidemia. The picc line was removed and the patient was started on fluconazole. The patient was also started on anticoagulation for UE thrombosis. The patient was discharged on lovenox bridge to coumadin. The patient had not picked up the lovenox. His last dose was 02/27/14 in the am. He picked up an "anticoagulant" of unknown name. He did not take this medication today.   The patient did well at home last night. He tolerated  chicken soup w.o problems. He denies alcohol or drug use. Admits to drinking coffee and smoking cigarettes. This morning he did not feel well upon waking. He had a cup of coffee. He then vomited a small amount of dark blood. The patient wasn't sure this was a true bleed. However, he began to feel worse and had a large volume emesis of bright rid blood with mixed clots. He and his wife state that he covered the bathroom in blood. This happened around 2 pm today. He then drove the the nearest ED. Since then he has felt much better and has had not return of nausea of bloody emesis. He denies melena and hematochezia. He admits to vague RUQ abdominal pain. He denies SOB, palpitations, Chest pain. Denies blood in urine.   Meds: Current Facility-Administered Medications  Medication Dose Route Frequency Provider Last Rate Last Dose  . HYDROmorphone (DILAUDID) injection 0.5 mg  0.5 mg Intravenous Q1H PRN Tanna Furry, MD      . HYDROmorphone (DILAUDID) injection 1 mg  1 mg Intravenous Q2H PRN Tanna Furry, MD      . octreotide (SANDOSTATIN) 2 mcg/mL in sodium chloride 0.9 % 250 mL infusion  25-50 mcg/hr Intravenous Continuous Tanna Furry, MD 12.5 mL/hr at 02/28/14 1822 25 mcg/hr at 02/28/14 1822  . pantoprazole (PROTONIX) 80 mg in sodium chloride 0.9 % 250 mL  infusion  8 mg/hr Intravenous Continuous Tanna Furry, MD 25 mL/hr at 02/28/14 2108 8 mg/hr at 02/28/14 2108  . [START ON 03/04/2014] pantoprazole (PROTONIX) injection 40 mg  40 mg Intravenous Q12H Tanna Furry, MD       Current Outpatient Prescriptions  Medication Sig Dispense Refill  . amitriptyline (ELAVIL) 25 MG tablet Take 25 mg by mouth at bedtime.      . clonazePAM (KLONOPIN) 0.5 MG tablet Take 0.5 mg by mouth at bedtime.      Marland Kitchen FLUoxetine (PROZAC) 40 MG capsule Take 40 mg by mouth daily.      . nafcillin 2 g in dextrose 5 % 50 mL Inject 2 g into the vein every 4 (four) hours.       Marland Kitchen oxyCODONE (ROXICODONE) 15 MG immediate release tablet Take 15 mg by  mouth every 4 (four) hours as needed for pain.      Marland Kitchen propranolol (INDERAL) 10 MG tablet Take 1 tablet (10 mg total) by mouth 3 (three) times daily.  90 tablet  0  . rifampin (RIFADIN) 300 MG capsule Take 600 mg by mouth daily.      . sennosides-docusate sodium (SENOKOT-S) 8.6-50 MG tablet Take 2 tablets by mouth 2 (two) times daily.      Marland Kitchen enoxaparin (LOVENOX) 100 MG/ML injection Inject 90 mg into the skin every 12 (twelve) hours.        Allergies: Allergies as of 02/28/2014 - Review Complete 02/28/2014  Allergen Reaction Noted  . Tylenol [acetaminophen] Other (See Comments) 12/06/2011  . Asa [aspirin] Other (See Comments) 02/02/2013  . Morphine and related Itching 11/09/2011  . Tramadol Nausea And Vomiting and Swelling 06/29/2012   Past Medical History  Diagnosis Date  . Hepatic cirrhosis   . Hypertension     NO LONGER TAKING B/P MEDS--STATES DISCONTINUED BY HIS DOCTOR  . Anxiety   . Shortness of breath   . GERD (gastroesophageal reflux disease)   . Restless leg syndrome   . Complication of anesthesia     ENDO/COLONOSCOPY ATTEMPTED IN THE PAST-UNABLE TO ADEQUATELY SEDATE PT-TOLD WOULD NEED ANESTHESIA FOR FUTURE PROCEDURES  . Pneumonia 09-2012  . Headache(784.0)     HX OF MIGRAINES  . Arthritis     hands  . Hepatitis B 01-30-13    liver enzymes remains elevated  . Hepatitis C   . Wound healing, delayed 01-30-13    3-22-'14 s/p I&D right ankle surgery(infection) -minimal drainage with dressing  . 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 Syncope 03/09/2013   Past Surgical History  Procedure Laterality Date  . Appendectomy    . Esophageal banding      FOR TX OF GI BLEED ABOUT 2008  . Arm debridement  08-11-12    right forearm wound with debridement 2 months ago/ right abdomen also-healing well  . Multiple tooth extractions  08/12/2012  . Colonoscopy with propofol  08/13/2012    Procedure: COLONOSCOPY WITH PROPOFOL;  Surgeon: Arta Silence, MD;  Location:  [facility] ENDOSCOPY;  Service: Endoscopy;  Laterality: N/A;  . 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 Esophagogastroduodenoscopy N/A 11/18/2012    Procedure: ESOPHAGOGASTRODUODENOSCOPY (EGD);  Surgeon: Lear Ng, MD;  Location[facility] ENDOSCOPY;  Service: Endoscopy;  Laterality: N/A;  . Esophageal banding  11/18/2012    Procedure: ESOPHAGEAL BANDING;  Surgeon: Lear Ng, MD;  Location: [facility] ENDOSCOPY;  Service: Endoscopy;;  . I&d extremity  Right 12/13/2012    Procedure: IRRIGATION AND DEBRIDEMENT EXTREMITY;  Surgeon: Mauri Pole, MD;  Location: [place];  Service: Orthopedics;  Laterality: Right;  . Ankle surgery      Debridement of ankle 12-13-12-[place]/ Dr. Zigmund Daniel  . Peripherally inserted central catheter insertion      insertion and removal(IVR)  . Esophagogastroduodenoscopy N/A 02/03/2013    Procedure: ESOPHAGOGASTRODUODENOSCOPY (EGD);  Surgeon: Lear Ng, MD;  Location[facility] ENDOSCOPY;  Service: Endoscopy;  Laterality: N/A;  . Esophagogastroduodenoscopy N/A 03/07/2013    Procedure: ESOPHAGOGASTRODUODENOSCOPY (EGD);  Surgeon: Jeryl Columbia, MD;  Location: [facility] ENDOSCOPY;  Service: Endoscopy;  Laterality: N/A;  . Esophageal banding  03/07/2013    Procedure: ESOPHAGEAL BANDING;  Surgeon: Jeryl Columbia, MD;  Location: [facility] ENDOSCOPY;  Service: Endoscopy;;   History reviewed. No pertinent family history. History   Social History  . Marital Status: Married    Spouse Name: N/A    Number of Children: N/A  . Years of Education: N/A   Occupational History  . Not on file.   Social History Main Topics  . Smoking status: Current Every Day Smoker -- 0.50 packs/day for 30 years    Types: Cigarettes  . Smokeless tobacco: Never Used  . Alcohol Use: No     Comment: RARELY USES ALCOHOL  . Drug Use: No     Comment: HX OF POSITIVE URINE DRUG SCREEN FOR COCAINE--PT DENIES USE OF COCAINE  . Sexual Activity: Yes   Other Topics Concern  . Not on file    Social History Narrative  . No narrative on file    Review of Systems: Pertinent items are noted in HPI.  Physical Exam: Blood pressure 104/63, pulse 88, temperature 98.4 F (36.9 C), temperature source Oral, resp. rate 13, height 6\' 3"  (1.905 m), weight 197 lb (89.359 kg), SpO2 93.00%. Physical Exam  Constitutional: He is oriented to person, place, and time. He appears well-developed and well-nourished. No distress.  Man obese much older that stated age  HENT:  Head: Normocephalic and atraumatic.  Mouth/Throat: Oropharynx is clear and moist.  Eyes: EOM are normal.  Mildly jaundiced sclera. 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  Cardiovascular: Normal rate, regular rhythm, normal heart sounds and intact distal pulses.  Exam reveals no friction rub.   No murmur heard. Pulmonary/Chest: Effort normal and breath sounds normal. No respiratory distress. He has no wheezes. He has no rales.  Abdominal: Soft. Bowel sounds are normal. He exhibits no distension. There is tenderness. There is no rebound and no guarding.  Mild RUQ tenderness  Neurological: He is alert and oriented to person, place, and time.  Skin: He is not diaphoretic.  Psychiatric: He has a normal mood and affect. His behavior is normal.     Lab results: Basic Metabolic Panel:  Recent Labs  02/28/14 1628 02/28/14 1715  NA 134* 140  K 3.8 3.8  CL 98 98  CO2 23  --   GLUCOSE 111* 112*  BUN 19 20  CREATININE 1.18 1.40*  CALCIUM 9.6  --    Liver Function Tests:  Recent Labs  02/28/14 1628  AST 21  ALT 9  ALKPHOS 79  BILITOT 0.9  PROT 8.5*  ALBUMIN 2.4*   CBC:  Recent Labs  02/28/14 1628 02/28/14 1715  WBC 7.6  --   HGB 11.4* 11.9*  HCT 34.3* 35.0*  MCV 88.4  --   PLT 135*  --  Coagulation:  Recent Labs  02/28/14 1628  LABPROT 15.4*  INR 1.25   Urine Drug Screen: Drugs of Abuse     Component Value Date/Time   LABOPIA NONE DETECTED 07/22/2013  0104   LABOPIA  Value: POSITIVE (NOTE) Result repeated and verified. Sent for confirmatory testing* 06/05/2010 0629   COCAINSCRNUR NONE DETECTED 07/22/2013 0104   COCAINSCRNUR NEGATIVE 06/05/2010 0629   LABBENZ NONE DETECTED 07/22/2013 0104   LABBENZ  Value: POSITIVE (NOTE) Result repeated and verified. Sent for confirmatory testing* 06/05/2010 0629   AMPHETMU NONE DETECTED 07/22/2013 0104   AMPHETMU NEGATIVE 06/05/2010 0629   THCU NONE DETECTED 07/22/2013 0104   LABBARB NONE DETECTED 07/22/2013 0104     Other results: EKG: not available at time of admission note.  Assessment & Plan by Problem: Principal Problem:   Hematemesis Active Problems:   Hepatitis C   Hepatitis B   Cirrhosis of liver   Hypertension   Esophageal varices in cirrhosis   Restless leg syndrome   Left subclavian vein thrombosis   Candidemia   Discitis of cervical region  #: Hematemesis: Most likely due to upper GI bleeding secondary to esophageal varices. Patient has hx of hepatitis B and C, and hx of IV drug use. He developed cirrhosis as evidenced by [place] on 05/18/13. He had hx of of esophageal varices which were banded previously (03/07/13 and 02/03/13). Recently he was started with levonox and possibly coumadin for left subclavian thrombosis in the thrombophlebitis, which is likely the triggering factor this time GIB. Patient is hemodynamically stable in ED. He had tachycardia initially with heart rate of 103, but after received fresh frozen plasma, his heart rate decreased to 90 per minute. Hgb 12.4 on 02/21/14-->11.9. Blood pressure is 108/79. His INR is 1.25. He received one unit of FFP in ED.  Plan:  - Will admit to SDU  - GI consulted by ED, will see pt in AM per ED physician  - NPO for possible EGD  - NS at 75 mL/hr  - Start IV pantoprazole and octreotide.  - Zofran IV for nausea  - patient need IV Rocephin 1 g X 7 days for SBP PPx, but he is on IV nafcillin, and oral rifampin for his discitis, will continue  and not start IV Rocephin now.  - Avoid NSAIDs  - d/c lovenox and avoid SQ heparin  - Maintain IV access (2 large bore IVs if possible).  - continue BB, propranolol 10 mg tid, will discontinue if his blood pressure drops.  - F/U INR, troponin x 1, Lactate, Mg, Phos - Monitor closely and follow q12h cbc, transfuse as necessary.   # Recent Candidemia: Patient with blood culture positive for Candida tropicalis. Likely source was from PICC line which was removed. He was started on IV fluconazole which was transitioned to PO fluconazole with plan for treatment ending 03/09/2014.   # Thrombosis of the left subclavian and brachiocephalic veins with thrombophlebitis: this was likely secondary to his PICC line, which was removed. The vaascular surgery was consulted in his recently admission. No acute surgical intervention indicated and recommended continuing anti-coagulation. He was transitioned from Heparin gtt to Lovenox. Given patient on Rifampin and Diflucan (significant interaction with Coumadin), it was felt to be challenging to [facility] therapeutic appropriate INR due to drug interactions. Thus, he was discharged on lovenox with plan for transition to warfarin after completion of rifampin and fluconazole. Last dose of lovenox was on 02/27/14 in the am.  -will d/c lovenox  -will  evaluate the risks and benefits or anticoagulation for UE DVT associated with PICC line after eval by GI.  # C6-7 discitis/osteomyelitis s/p corpectomy with irrigation & debridement (01/16/14): MSSA grown from cultures from corpectomy and I&D on 01/16/14. The patient followed with ID, and has been on nafcillin and rifampin IV via left PICC (placed 01/20/14). PICC was removed due to above and anti-biotics continued through PO and PIV. Repeat cultures drawn with no bacterial growth per d/c summary. The patient continued IV nafcillin 2 gm q4h and PO rifampin 300 mg BID with a plan to complete 8 week course ending June 20th. He will need to  start oral doxycycline 100 mg po BID on 6/21 for chronic oral suppressive therapy (agent chosen to minimize potential interactions with warfarin). Follow-up with ID as outpatient.  # Cirrhosis: has hx of hepatitis B and C and hx of IVDU with Cirrhotic liver with splenomegaly noted on imaging. HIV was negative. He is on propranolol 10 mg q8h, spironolactone 50 mg daily with Bowel regimen as needed.  -will d/c spironolactone in the setting of GIB  -will continue propranolol   # Restless leg syndrome: Continued clonazepam 0.5 mg TID prn and Amitriptyiline 25 mg nightly prn   # Depression: Continued home fluoxetine 40 mg daily   DVT: SCDs  Diet: NPO  Dispo: Disposition is deferred at this time, awaiting improvement of current medical problems. Anticipated discharge in approximately 1-3 day(s).   The patient does have a current PCP Redmond Pulling Arna Medici, MD) and does not need an [facility] hospital follow-up appointment after discharge.  The patient does have transportation limitations that hinder transportation to clinic appointments.  Signed: Marrion Coy, MD 02/28/2014, 9:35 PM

## 2014-02-28 NOTE — ED Notes (Addendum)
Pt ambulatory to b/r. Steady gait, "feel pretty good", c/o side pain, also some neck pain, and a little light headed. (denies: nausea or sob), preparing to transport to inpt bed.

## 2014-02-28 NOTE — ED Notes (Signed)
bagan vomiting blood this morning bright and dark red blood has hx of esophageal varicosities that have needed to be banded in past released from [facility] yesterday after receiving heparin and sent home on coumadinc/o llightheadedness

## 2014-02-28 NOTE — ED Provider Notes (Signed)
CSN: [id number]     Arrival date & time 02/28/14  1610 History   First MD Initiated Contact with Patient 02/28/14 1635     Chief Complaint  Patient presents with  . Hematemesis      HPI  She presents with vomiting blood. Extensive history liver disease cirrhosis and known esophageal varices. He follows with Dr. Stacie Glaze GI. Last endoscopy was one year ago and did not require banding. He has required banding for acute bleeding in the past.    He did get recent medical history includes anterior cervical approach for decortication of osteomyelitis and epidural hematoma and cervical spine. This was performed at [facility]. Seen here May 31 with neck pain. CT scan showed occlusion of his left internal jugular vein was diagnosed with a septic jugular thrombophlebitis. Was discharged with a PICC line for nafcillin. Also discharge on Coumadin, and Lovenox.    He had 2 episodes of bright red hematemesis. Had a third episode here of some dark blood and clots. Felt weak. Not lightheaded or syncopal. No dark stools.  Past Medical History  Diagnosis Date  . Hepatic cirrhosis   . Hypertension     NO LONGER TAKING B/P MEDS--STATES DISCONTINUED BY HIS DOCTOR  . Anxiety   . Shortness of breath   . GERD (gastroesophageal reflux disease)   . Restless leg syndrome   . Complication of anesthesia     ENDO/COLONOSCOPY ATTEMPTED IN THE PAST-UNABLE TO ADEQUATELY SEDATE PT-TOLD WOULD NEED ANESTHESIA FOR FUTURE PROCEDURES  . Pneumonia 09-2012  . Headache(784.0)     HX OF MIGRAINES  . Arthritis     hands  . Hepatitis B 01-30-13    liver enzymes remains elevated  . Hepatitis C   . Wound healing, delayed 01-30-13    3-22-'14 s/p I&D right ankle surgery(infection) -minimal drainage with dressing  . 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 Syncope 03/09/2013   Past Surgical History  Procedure Laterality Date  . Appendectomy    . Esophageal banding      FOR TX OF GI BLEED ABOUT  2008  . Arm debridement  08-11-12    right forearm wound with debridement 2 months ago/ right abdomen also-healing well  . Multiple tooth extractions  08/12/2012  . Colonoscopy with propofol  08/13/2012    Procedure: COLONOSCOPY WITH PROPOFOL;  Surgeon: Arta Silence, MD;  Location: [facility] ENDOSCOPY;  Service: Endoscopy;  Laterality: N/A;  . 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 Esophagogastroduodenoscopy N/A 11/18/2012    Procedure: ESOPHAGOGASTRODUODENOSCOPY (EGD);  Surgeon: Lear Ng, MD;  Location: [facility] ENDOSCOPY;  Service: Endoscopy;  Laterality: N/A;  . Esophageal banding  11/18/2012    Procedure: ESOPHAGEAL BANDING;  Surgeon: Lear Ng, MD;  Location: [facility];  Service: Endoscopy;;  . I&d extremity Right 12/13/2012    Procedure: IRRIGATION AND DEBRIDEMENT EXTREMITY;  Surgeon: Mauri Pole, MD;  Location: [facility];  Service: Orthopedics;  Laterality: Right;  . Ankle surgery      Debridement of ankle 12-13-12-[place]/ Dr. Zigmund Daniel  . Peripherally inserted central catheter insertion      insertion and removal(IVR)  . Esophagogastroduodenoscopy N/A 02/03/2013    Procedure: ESOPHAGOGASTRODUODENOSCOPY (EGD);  Surgeon: Lear Ng, MD;  Location: [facility] ENDOSCOPY;  Service: Endoscopy;  Laterality: N/A;  . Esophagogastroduodenoscopy N/A 03/07/2013    Procedure: ESOPHAGOGASTRODUODENOSCOPY (EGD);  Surgeon: Jeryl Columbia, MD;  Location: [facility] ENDOSCOPY;  Service: Endoscopy;  Laterality: N/A;  . Esophageal banding  03/07/2013    Procedure: ESOPHAGEAL BANDING;  Surgeon: Jeryl Columbia, MD;  Location: [facility] ENDOSCOPY;  Service: Endoscopy;;   History reviewed. No pertinent family history. History  Substance Use Topics  . Smoking status: Current Every Day Smoker -- 0.50 packs/day for 30 years    Types: Cigarettes  . Smokeless tobacco: Never Used  . Alcohol Use: No     Comment: RARELY USES ALCOHOL    Review of Systems  Constitutional: Positive for  fatigue. Negative for fever, chills, diaphoresis and appetite change.  HENT: Negative for mouth sores, sore throat and trouble swallowing.   Eyes: Negative for visual disturbance.  Respiratory: Negative for cough, chest tightness, shortness of breath and wheezing.   Cardiovascular: Negative for chest pain.  Gastrointestinal: Positive for nausea, vomiting and abdominal pain. Negative for diarrhea and abdominal distention.       Hematemesis  Endocrine: Negative for polydipsia, polyphagia and polyuria.  Genitourinary: Negative for dysuria, frequency and hematuria.  Musculoskeletal: Negative for gait problem.  Skin: Negative for color change, pallor and rash.  Neurological: Positive for dizziness and weakness. Negative for syncope, light-headedness and headaches.  Hematological: Does not bruise/bleed easily.  Psychiatric/Behavioral: Negative for behavioral problems and confusion.      Allergies  Tylenol; Asa; Morphine and related; and Tramadol  Home Medications   Prior to Admission medications   Medication Sig Start Date End Date Taking? Authorizing Provider  amitriptyline (ELAVIL) 25 MG tablet Take 25 mg by mouth at bedtime.   Yes Historical Provider, MD  clonazePAM (KLONOPIN) 0.5 MG tablet Take 0.5 mg by mouth at bedtime.   Yes Historical Provider, MD  FLUoxetine (PROZAC) 40 MG capsule Take 40 mg by mouth daily.   Yes Historical Provider, MD  nafcillin 2 g in dextrose 5 % 50 mL Inject 2 g into the vein every 4 (four) hours.    Yes Historical Provider, MD  oxyCODONE (ROXICODONE) 15 MG immediate release tablet Take 15 mg by mouth every 4 (four) hours as needed for pain.   Yes Historical Provider, MD  propranolol (INDERAL) 10 MG tablet Take 1 tablet (10 mg total) by mouth 3 (three) times daily. 03/11/13  Yes Jerene Pitch, MD  rifampin (RIFADIN) 300 MG capsule Take 600 mg by mouth daily.   Yes Historical Provider, MD  sennosides-docusate sodium (SENOKOT-S) 8.6-50 MG tablet Take 2 tablets  by mouth 2 (two) times daily.   Yes Historical Provider, MD  enoxaparin (LOVENOX) 100 MG/ML injection Inject 90 mg into the skin every 12 (twelve) hours.    Historical Provider, MD   BP 108/79  Pulse 103  Temp(Src) 98 F (36.7 C) (Oral)  Resp 15  Ht 6\' 3"  (1.905 m)  Wt 197 lb (89.359 kg)  BMI 24.62 kg/m2  SpO2 93% Physical Exam  Constitutional: He is oriented to person, place, and time. He appears well-developed and well-nourished. No distress.  HENT:  Head: Normocephalic.  Eyes: Conjunctivae are normal. Pupils are equal, round, and reactive to light. No scleral icterus.  Conjunctiva are not pale.  Neck: Normal range of motion. Neck supple. No thyromegaly present.    Cardiovascular: Normal rate and regular rhythm.  Exam reveals no gallop and no friction rub.   No murmur heard. Pulmonary/Chest: Effort normal and breath sounds normal. No respiratory distress. He has no wheezes. He has no rales.  Abdominal: Soft. Bowel sounds are normal. He exhibits no distension. There is no tenderness. There is no  rebound.  Musculoskeletal: Normal range of motion.  Neurological: He is alert and oriented to person, place, and time.  Skin: Skin is warm and dry. No rash noted.  Psychiatric: He has a normal mood and affect. His behavior is normal.    ED Course  CRITICAL CARE Performed by: Deepa Barthel, [place] by: Tanna Furry Total critical care time: 90 minutes Critical care start time: 02/28/2014 6:24 PM Critical care end time: 02/28/2014 7:54 PM Critical care time was exclusive of separately billable procedures and treating other patients. Critical care was necessary to treat or prevent imminent or life-threatening deterioration of the following conditions: Acute symptomatic upper GI bleed. Critical care was time spent personally by me on the following activities: blood draw for specimens, development of treatment plan with patient or surrogate, discussions with consultants, discussions with  primary provider, evaluation of patient's response to treatment, interpretation of cardiac output measurements, examination of patient, obtaining history from patient or surrogate, ordering and performing treatments and interventions, ordering and review of laboratory studies, ordering and review of radiographic studies and re-evaluation of patient's condition. Comments: Given infusion of Octreotide and PPI.  Transfusion of FFP.   (including critical care time) Labs Review Labs Reviewed  PROTIME-INR - Abnormal; Notable for the following:    Prothrombin Time 15.4 (*)    All other components within normal limits  CBC - Abnormal; Notable for the following:    RBC 3.88 (*)    Hemoglobin 11.4 (*)    HCT 34.3 (*)    RDW 17.3 (*)    Platelets 135 (*)    All other components within normal limits  COMPREHENSIVE METABOLIC PANEL - Abnormal; Notable for the following:    Sodium 134 (*)    Glucose, Bld 111 (*)    Total Protein 8.5 (*)    Albumin 2.4 (*)    GFR calc non Af Amer 70 (*)    GFR calc Af Amer 82 (*)    All other components within normal limits  I-STAT CHEM 8, ED - Abnormal; Notable for the following:    Creatinine, Ser 1.40 (*)    Glucose, Bld 112 (*)    Calcium, Ion 1.29 (*)    Hemoglobin 11.9 (*)    HCT 35.0 (*)    All other components within normal limits  POC OCCULT BLOOD, ED  TYPE AND SCREEN  PREPARE FRESH FROZEN PLASMA    Imaging Review No results found.   EKG Interpretation None      MDM   Final diagnoses:  Esophageal varices  Upper GI bleed  Jugular vein thrombosis  Coagulopathy    He had 1 dip of his blood pressure to 98. Resolved with fluid/medications. Hemoglobin 11.4. INR 1.25. Obtained consent from him for blood transfusion. I requested 2 units PRBCs to be crossmatched. I  have asked for transfusion of 2 units of fresh frozen plasma. This is being performed now. He was given bolus and drip of octreotide. He's been given bolus and drip of  Protonix.  Initially I discussed the case with the general medical team physician at [facility]. However, the patient refuses transfer there. He states it's "too far". He does consent to stay here. I discussed the case with GI Dr. Nigel Mormon return my call we discussed the care. Discussed the case with internal medicine resident. To be evaluated by medicine in the emergency room. Plan endoscopy per GI.    Tanna Furry, MD 02/28/14 [phone]

## 2014-02-28 NOTE — ED Notes (Signed)
Report called to  2600 rn

## 2014-02-28 NOTE — ED Notes (Signed)
The plasma has infused.  C/o abd pain  Again.  Requesting pain med

## 2014-02-28 NOTE — ED Notes (Signed)
Pt discussing his history.  He does not want to go to [facility].  Apparently there has been a discussion previously

## 2014-02-28 NOTE — ED Notes (Signed)
Admitting doctors at the bedside 

## 2014-03-01 ENCOUNTER — Encounter (HOSPITAL_COMMUNITY): Payer: Medicaid Other | Admitting: Certified Registered Nurse Anesthetist

## 2014-03-01 ENCOUNTER — Encounter (HOSPITAL_COMMUNITY): Payer: Self-pay | Admitting: Certified Registered Nurse Anesthetist

## 2014-03-01 ENCOUNTER — Inpatient Hospital Stay (HOSPITAL_COMMUNITY): Payer: Medicaid Other

## 2014-03-01 ENCOUNTER — Encounter (HOSPITAL_COMMUNITY)
Admission: EM | Discharge status: Against Medical Advice | Payer: Medicaid Other | Source: Home / Self Care | Attending: Internal Medicine

## 2014-03-01 ENCOUNTER — Inpatient Hospital Stay (HOSPITAL_COMMUNITY): Payer: Medicaid Other | Admitting: Certified Registered Nurse Anesthetist

## 2014-03-01 DIAGNOSIS — I10 Essential (primary) hypertension: Secondary | ICD-10-CM

## 2014-03-01 DIAGNOSIS — B3789 Other sites of candidiasis: Secondary | ICD-10-CM

## 2014-03-01 DIAGNOSIS — A4901 Methicillin susceptible Staphylococcus aureus infection, unspecified site: Secondary | ICD-10-CM

## 2014-03-01 HISTORY — PX: ESOPHAGOGASTRODUODENOSCOPY: SHX5428

## 2014-03-01 LAB — LACTIC ACID, PLASMA: Lactic Acid, Venous: 0.6 mmol/L (ref 0.5–2.2)

## 2014-03-01 LAB — PREPARE FRESH FROZEN PLASMA
UNIT DIVISION: 0
Unit division: 0

## 2014-03-01 LAB — CBC
HCT: 26.5 % — ABNORMAL LOW (ref 39.0–52.0)
HCT: 25.8 % — ABNORMAL LOW (ref 39.0–52.0)
HEMOGLOBIN: 8.5 g/dL — AB (ref 13.0–17.0)
Hemoglobin: 8.7 g/dL — ABNORMAL LOW (ref 13.0–17.0)
MCH: 29.1 pg (ref 26.0–34.0)
MCH: 29.8 pg (ref 26.0–34.0)
MCHC: 32.8 g/dL (ref 30.0–36.0)
MCHC: 32.9 g/dL (ref 30.0–36.0)
MCV: 88.6 fL (ref 78.0–100.0)
MCV: 90.5 fL (ref 78.0–100.0)
Platelets: DECREASED 10*3/uL (ref 150–400)
Platelets: 72 10*3/uL — ABNORMAL LOW (ref 150–400)
RBC: 2.99 MIL/uL — AB (ref 4.22–5.81)
RBC: 2.85 MIL/uL — ABNORMAL LOW (ref 4.22–5.81)
RDW: 17.4 % — ABNORMAL HIGH (ref 11.5–15.5)
RDW: 17.4 % — AB (ref 11.5–15.5)
WBC: 3.5 10*3/uL — ABNORMAL LOW (ref 4.0–10.5)
WBC: 3.2 10*3/uL — AB (ref 4.0–10.5)

## 2014-03-01 LAB — BASIC METABOLIC PANEL
BUN: 22 mg/dL (ref 6–23)
CO2: 26 mEq/L (ref 19–32)
Calcium: 9 mg/dL (ref 8.4–10.5)
Chloride: 100 mEq/L (ref 96–112)
Creatinine, Ser: 1.11 mg/dL (ref 0.50–1.35)
GFR calc Af Amer: 88 mL/min — ABNORMAL LOW (ref >90)
GFR, EST NON AFRICAN AMERICAN: 76 mL/min — AB (ref >90)
Glucose, Bld: 131 mg/dL — ABNORMAL HIGH (ref 70–99)
POTASSIUM: 4.2 meq/L (ref 3.7–5.3)
SODIUM: 136 meq/L — AB (ref 137–147)

## 2014-03-01 LAB — PROTIME-INR
INR: 1.23 (ref 0.00–1.49)
PROTHROMBIN TIME: 15.2 s (ref 11.6–15.2)

## 2014-03-01 LAB — MRSA PCR SCREENING: MRSA by PCR: NEGATIVE

## 2014-03-01 LAB — PHOSPHORUS: Phosphorus: 4.2 mg/dL (ref 2.3–4.6)

## 2014-03-01 LAB — TROPONIN I: Troponin I: <0.3 ng/mL (ref <0.30)

## 2014-03-01 LAB — GLUCOSE, CAPILLARY: Glucose-Capillary: 121 mg/dL — ABNORMAL HIGH (ref 70–99)

## 2014-03-01 LAB — MAGNESIUM: Magnesium: 1.9 mg/dL (ref 1.5–2.5)

## 2014-03-01 SURGERY — EGD (ESOPHAGOGASTRODUODENOSCOPY)
Anesthesia: Monitor Anesthesia Care

## 2014-03-01 MED ORDER — FLUCONAZOLE 200 MG PO TABS: 600.0000 mg | ORAL_TABLET | Freq: Every day | ORAL | Status: DC

## 2014-03-01 MED ORDER — FENTANYL CITRATE 0.05 MG/ML IJ SOLN: 25.0000 ug | INTRAMUSCULAR | Status: DC | PRN

## 2014-03-01 MED ORDER — MIDAZOLAM HCL 5 MG/5ML IJ SOLN
INTRAMUSCULAR | Status: DC | PRN
  Administered 2014-03-01: 2 mg via INTRAVENOUS

## 2014-03-01 MED ORDER — LACTATED RINGERS IV SOLN
INTRAVENOUS | Status: DC
  Administered 2014-03-01: 08:00:00 via INTRAVENOUS

## 2014-03-01 MED ORDER — LIDOCAINE HCL (PF) 2 % IJ SOLN
INTRAMUSCULAR | Status: DC | PRN
  Administered 2014-03-01: 8 mg

## 2014-03-01 MED ORDER — ONDANSETRON HCL 4 MG/2ML IJ SOLN: 4.0000 mg | Freq: Once | INTRAMUSCULAR | Status: DC | PRN

## 2014-03-01 MED ORDER — SODIUM CHLORIDE 0.9 % IV SOLN: INTRAVENOUS | Status: DC

## 2014-03-01 MED ORDER — PANTOPRAZOLE SODIUM 40 MG PO TBEC: 40.0000 mg | DELAYED_RELEASE_TABLET | Freq: Every day | ORAL | Status: AC

## 2014-03-01 MED ORDER — PROPOFOL 10 MG/ML IV BOLUS
INTRAVENOUS | Status: DC | PRN
  Administered 2014-03-01: 220 mg via INTRAVENOUS
  Administered 2014-03-01: 20 mg via INTRAVENOUS

## 2014-03-01 MED ORDER — SODIUM CHLORIDE 0.9 % IJ SOLN: 10.0000 mL | INTRAMUSCULAR | Status: DC | PRN

## 2014-03-01 MED ORDER — LACTATED RINGERS IV SOLN
INTRAVENOUS | Status: DC | PRN
  Administered 2014-03-01: 09:00:00 via INTRAVENOUS

## 2014-03-01 MED ORDER — SODIUM CHLORIDE 0.9 % IJ SOLN
10.0000 mL | Freq: Two times a day (BID) | INTRAMUSCULAR | Status: DC
  Administered 2014-03-01: 10 mL

## 2014-03-01 MED ORDER — SUCCINYLCHOLINE CHLORIDE 20 MG/ML IJ SOLN
INTRAMUSCULAR | Status: DC | PRN
  Administered 2014-03-01: 60 mg via INTRAVENOUS

## 2014-03-01 NOTE — Care Management Note (Addendum)
    Page 1 of 1   03/01/2014     3:24:25 PM CARE MANAGEMENT NOTE 03/01/2014  Patient:  ERYCK, NEGRON   Account Number:  [id number]  Date Initiated:  03/01/2014  Documentation initiated by:  Elissa Hefty  Subjective/Objective Assessment:   vom blood     Action/Plan:   lives w wife, pcp dr w elkins   Anticipated DC Date:  03/01/2014   Anticipated DC Plan:  [place]  CM consult      [facility] Choice  Resumption Of Svcs/PTA Provider   Choice offered to / List presented to:          [facility] arranged  HH-1 RN      [facility] agency  [place]   Status of service:   Medicare Important Message given?   (If response is "NO", the following Medicare IM given date fields will be blank) Date Medicare IM given:   Date Additional Medicare IM given:    Discharge Disposition:  [place]  Per UR Regulation:  Reviewed for med. necessity/level of care/duration of stay  If discussed at [place] of Stay Meetings, dates discussed:    Comments:  6/8 1521 debbie Yuriana Gaal rn,bsn spoke w resident and pt was act w [facility] in [place] for hhrn and [facility] for iv antibiotics. pt wants to cont w both agencies. have faxed resumption of care orders to [facility] at [phone] and [facility] at [phone].pt dc home today.

## 2014-03-01 NOTE — Progress Notes (Signed)
Nutrition Brief Note  Patient identified on the Malnutrition Screening Tool (MST) Report for recent weight lost without trying and eating poorly because of a decreased appetite.  Per readings below, patient has had a 8% weight loss since September 2014; not significant for time frame.  Wt Readings from Last 15 Encounters:  03/01/14 183 lb 3.2 oz (83.1 kg)  03/01/14 183 lb 3.2 oz (83.1 kg)  06/03/13 200 lb (90.719 kg)  05/18/13 190 lb (86.183 kg)  05/13/13 195 lb (88.451 kg)  03/11/13 210 lb 1.6 oz (95.3 kg)  03/09/13 211 lb 13.8 oz (96.1 kg)  03/09/13 211 lb 13.8 oz (96.1 kg)  02/05/13 205 lb 4.8 oz (93.123 kg)  02/05/13 205 lb 4.8 oz (93.123 kg)  12/13/12 260 lb (117.935 kg)  12/13/12 260 lb (117.935 kg)  11/19/12 214 lb 4.6 oz (97.2 kg)  11/19/12 214 lb 4.6 oz (97.2 kg)  08/27/12 260 lb (117.935 kg)    Body mass index is 23.51 kg/(m^2). Patient meets criteria for Normal based on current BMI.   Current diet order is Clear Liquids, patient is consuming approximately 100% of meals at this time. Labs and medications reviewed.   No nutrition interventions warranted at this time. If nutrition issues arise, please consult RD.   Arthur Holms, RD, LDN Pager #: [phone] After-Hours Pager #: [phone]

## 2014-03-01 NOTE — Progress Notes (Signed)
I have seen the patient and reviewed the daily progress note by Arrie Aran MS 3 and discussed the care of the patient with them.  See below for documentation of my findings, assessment, and plans.  Subjective: Patient seen post EGD.  Patient is very pleasant and comfortable.  He reports he has had no further episodes of hematemesis.  He notes he is feeling very well and wants to go home to be with his wife and children. Objective: Vital signs in last 24 hours: Filed Vitals:   03/01/14 1010 03/01/14 1020 03/01/14 1030 03/01/14 1205  BP: 99/75 103/65 105/71   Pulse: 66 68 71   Temp:    97.7 F (36.5 C)  TempSrc:    Oral  Resp: 13 14 16    Height:      Weight:      SpO2: 96% 90% 89%    Weight change:   Intake/Output Summary (Last 24 hours) at 03/01/14 1711 Last data filed at 03/01/14 1328  Gross per 24 hour  Intake   3570 ml  Output    250 ml  Net   3320 ml   General: resting in bed HEENT: EOMI, no scleral icterus Cardiac: RRR, no rubs, murmurs or gallops Pulm: CTAB Abd: soft, mild RUQ tenderness, nondistended, BS present Ext: warm and well perfused, no pedal edema Neuro: alert and oriented X3, left side facial droop but cranial nerves II-XII grossly intact, RUE 4/5 muscle strength, RLE, LLE, and LUE grossly 5/5 strength. Lab Results: Reviewed and documented in Electronic Record Micro Results: Reviewed and documented in Electronic Record Studies/Results: Reviewed and documented in Electronic Record Medications: I have reviewed the patient's current medications. Scheduled Meds: . amitriptyline  25 mg Oral QHS  . clonazePAM  0.5 mg Oral QHS  . fluconazole  600 mg Oral Daily  . FLUoxetine  40 mg Oral Daily  . nafcillin 2 GM IVPB  2 g Intravenous 6 times per day  . [START ON 03/04/2014] pantoprazole (PROTONIX) IV  40 mg Intravenous Q12H  . propranolol  10 mg Oral TID  . rifampin  600 mg Oral Daily  . sodium chloride  10-40 mL Intracatheter Q12H   Continuous  Infusions: . sodium chloride 100 mL/hr at 03/01/14 0022  . pantoprozole (PROTONIX) infusion 8 mg/hr (03/01/14 0729)   PRN Meds:.fentaNYL, ondansetron (ZOFRAN) IV, oxyCODONE, sodium chloride Assessment/Plan:   Hematemesis -Patient underwent EGD today with Dr. Penelope Coop, revealed no active bleeding, mild esophageal varices unlikely to be cause of bleed.  Suspect due to gastritis.  Octreotide and Protonix drip discontinued.   - Protonix  BID - CBC q8 - Vitals stable- Transfer to Med Surg - Note repeat CBC shows Hgb dropped slightly to 8.5 from 8.7 earlier this AM. Received call from nursing that patinet demands to leave AMA.  Went to see patient and informed that my medical advice would be to stay until we see that his Hgb has stabilized.  He reported that he understood the risks of leaving AMA (which include continued bleeding, end organ damage, and possibly death).  He also noted that he would take medications as prescribed and call his PCP in the AM for a follow up appointment ASAP.  He was instructed to discontinue Lovenox and not to start taking Coumadin.  He will continue protonix daily as outpatient. Case management helped to resume [facility] care.     Cirrhosis of liver - Continue propranolol    Hypertension - Currently normotensive, not tachycardic.    Left  subclavian vein thrombosis Provoked by PICC line placement at [facility].  Given his significant GI bleed and weak recommendation for catheter induce upper extremity DVT treatment the risk of bleeding outweighs any benefit.  Would not restart Lovenox or coumadin.    Candidemia - Apparently has not taken fluconazole. Instructed patient to restart this medication at home.  Of note this can increase the potency of protonix. Will send out on once daily protonix He  03/09/2014   C6-7 discitis/osteomyelitis s/p corpectomy with irrigation & debridement (01/16/14): MSSA grown from cultures from corpectomy and I&D on 01/16/14 Patient to continue IV  nafcillin 2g q4h and PO rifampin 300mg  BID to complete 8 weeks ending June 20th. He is supposed to start doxycycline 100mg  BID for chronic suppressive therapy, although this could be reconsider as this was chosen when ID assumed patient would be on coumadin therapy     Dispo: Discharged AMA.  Patient will resume [facility] care and call PCP for follow up in AM  The patient does have a current PCP [facility]ing Arna Medici, MD) and does not need an [facility] hospital follow-up appointment after discharge.  The patient does not have transportation limitations that hinder transportation to clinic appointments.  .Services Needed at time of discharge: Y = Yes, Blank = No PT:   OT:   RN:   Equipment:   Other:     LOS: 1 day   Joni Reining, DO 03/01/2014, 5:11 PM

## 2014-03-01 NOTE — Consult Note (Signed)
Subjective:   HPI  The patient is a 53 year old male who was admitted to the hospital with hematemesis. He had a recent cervical neck surgery at [facility] and had a PICC line in which according to him clotted and therefore he was put on Coumadin. He has a history of hepatitis B cirrhosis of the liver and has had bleeding from esophageal varices in the past. He denies abdominal pain or melena at this time.  Review of Systems Denies chest pain or shortness of breath  Past Medical History  Diagnosis Date  . Hepatic cirrhosis   . Hypertension     NO LONGER TAKING B/P MEDS--STATES DISCONTINUED BY HIS DOCTOR  . Anxiety   . Shortness of breath   . GERD (gastroesophageal reflux disease)   . Restless leg syndrome   . Complication of anesthesia     ENDO/COLONOSCOPY ATTEMPTED IN THE PAST-UNABLE TO ADEQUATELY SEDATE PT-TOLD WOULD NEED ANESTHESIA FOR FUTURE PROCEDURES  . Pneumonia 09-2012  . Headache(784.0)     HX OF MIGRAINES  . Arthritis     hands  . Hepatitis B 01-30-13    liver enzymes remains elevated  . Hepatitis C   . Wound healing, delayed 01-30-13    3-22-'14 s/p I&D right ankle surgery(infection) -minimal drainage with dressing  . 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 Syncope 03/09/2013   Past Surgical History  Procedure Laterality Date  . Appendectomy    . Esophageal banding      FOR TX OF GI BLEED ABOUT 2008  . Arm debridement  08-11-12    right forearm wound with debridement 2 months ago/ right abdomen also-healing well  . Multiple tooth extractions  08/12/2012  . Colonoscopy with propofol  08/13/2012    Procedure: COLONOSCOPY WITH PROPOFOL;  Surgeon: Arta Silence, MD;  Location: [facility] ENDOSCOPY;  Service: Endoscopy;  Laterality: N/A;  . 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 Esophagogastroduodenoscopy N/A 11/18/2012    Procedure: ESOPHAGOGASTRODUODENOSCOPY (EGD);  Surgeon: Lear Ng, MD;  Location: [facility] ENDOSCOPY;   Service: Endoscopy;  Laterality: N/A;  . Esophageal banding  11/18/2012    Procedure: ESOPHAGEAL BANDING;  Surgeon: Lear Ng, MD;  Location: [place];  Service: Endoscopy;;  . I&d extremity Right 12/13/2012    Procedure: IRRIGATION AND DEBRIDEMENT EXTREMITY;  Surgeon: Mauri Pole, MD;  Location: [place];  Service: Orthopedics;  Laterality: Right;  . Ankle surgery      Debridement of ankle 12-13-12-[place]/ Dr. Zigmund Daniel  . Peripherally inserted central catheter insertion      insertion and removal(IVR)  . Esophagogastroduodenoscopy N/A 02/03/2013    Procedure: ESOPHAGOGASTRODUODENOSCOPY (EGD);  Surgeon: Lear Ng, MD;  Location: [facility] ENDOSCOPY;  Service: Endoscopy;  Laterality: N/A;  . Esophagogastroduodenoscopy N/A 03/07/2013    Procedure: ESOPHAGOGASTRODUODENOSCOPY (EGD);  Surgeon: Jeryl Columbia, MD;  Location: [facility] ENDOSCOPY;  Service: Endoscopy;  Laterality: N/A;  . Esophageal banding  03/07/2013    Procedure: ESOPHAGEAL BANDING;  Surgeon: Jeryl Columbia, MD;  Location: [facility] ENDOSCOPY;  Service: Endoscopy;;   History   Social History  . Marital Status: Married    Spouse Name: N/A    Number of Children: N/A  . Years of Education: N/A   Occupational History  . Not on file.   Social History Main Topics  . Smoking status: Current Every Day Smoker -- 0.50 packs/day for 30 years    Types: Cigarettes  .  Smokeless tobacco: Never Used  . Alcohol Use: Yes     Comment: RARELY USE OF ALCOHOL  . Drug Use: No     Comment: HX OF POSITIVE URINE DRUG SCREEN FOR COCAINE--PT DENIES USE OF COCAINE  . Sexual Activity: Yes   Other Topics Concern  . Not on file   Social History Narrative  . No narrative on file   family history is not on file. Current facility-administered medications:0.9 %  sodium chloride infusion, , Intravenous, Continuous, Missy Sabins, MD;  0.9 %  sodium chloride infusion, , Intravenous, Continuous, Ivor Costa, MD, Last Rate: 100 mL/hr at 03/01/14 0022;   [facility] HOLD] amitriptyline (ELAVIL) tablet 25 mg, 25 mg, Oral, QHS, Ivor Costa, MD, 25 mg at 03/01/14 0023;  [[facility] HOLD] clonazePAM (KLONOPIN) tablet 0.5 mg, 0.5 mg, Oral, QHS, Ivor Costa, MD, 0.5 mg at 03/01/14 0022 [facility] HOLD] fluconazole (DIFLUCAN) tablet 600 mg, 600 mg, Oral, Daily, Ivor Costa, MD, 600 mg at 03/01/14 0022;  [[facility] HOLD] FLUoxetine (PROZAC) capsule 40 mg, 40 mg, Oral, Daily, Ivor Costa, MD;  lactated ringers infusion, , Intravenous, Continuous, Wonda Horner, MD, Last Rate: 10 mL/hr at 03/01/14 0824;  [facility] HOLD] nafcillin 2 g in sodium chloride 0.9 % 50 mL IVPB, 2 g, Intravenous, 6 times per day, Ivor Costa, MD, 2 g at 03/01/14 1962 octreotide (SANDOSTATIN) 2 mcg/mL in sodium chloride 0.9 % 250 mL infusion, 25-50 mcg/hr, Intravenous, Continuous, Tanna Furry, MD, Last Rate: 12.5 mL/hr at 02/28/14 1822, 25 mcg/hr at 02/28/14 1822;  [facility] HOLD] oxyCODONE (Oxy IR/ROXICODONE) immediate release tablet 15 mg, 15 mg, Oral, Q4H PRN, Ivor Costa, MD, 15 mg at 03/01/14 2297 pantoprazole (PROTONIX) 80 mg in sodium chloride 0.9 % 250 mL infusion, 8 mg/hr, Intravenous, Continuous, Tanna Furry, MD, Last Rate: 25 mL/hr at 03/01/14 0729, 8 mg/hr at 03/01/14 0729;  [facility] HOLD] pantoprazole (PROTONIX) injection 40 mg, 40 mg, Intravenous, Q12H, Tanna Furry, MD;  [facility] HOLD] propranolol (INDERAL) tablet 10 mg, 10 mg, Oral, TID, Ivor Costa, MD, 10 mg at 03/01/14 0022 [facility] HOLD] rifampin (RIFADIN) capsule 600 mg, 600 mg, Oral, Daily, Ivor Costa, MD;  [facility] HOLD] sodium chloride 0.9 % injection 10-40 mL, 10-40 mL, Intracatheter, Q12H, Blain Pais, MD, 10 mL at 03/01/14 0340;  [facility] HOLD] sodium chloride 0.9 % injection 10-40 mL, 10-40 mL, Intracatheter, PRN, Blain Pais, MD Allergies  Allergen Reactions  . Tylenol [Acetaminophen] Other (See Comments)    Cirrhosis  . Asa [Aspirin] Other (See Comments)    cirrhosis  . Morphine And Related Itching  . Tramadol Nausea And Vomiting and Swelling    Abdominal swelling       Objective:     BP 117/76  Pulse 76  Temp(Src) 98 F (36.7 C) (Oral)  Resp 16  Ht 6\' 2"  (1.88 m)  Wt 83.1 kg (183 lb 3.2 oz)  BMI 23.51 kg/m2  SpO2 95%  He does not appear in any acute distress  Nonicteric  Multiple tattoos  Heart regular rhythm no murmurs heard  Lungs clear  Abdomen: Bowel sounds normal, soft, nontender, no obvious hepatosplenomegaly  Laboratory No components found with this basename: d1      Assessment:     #1. Hematemesis  #2. History of hepatitis B. with cirrhosis  #3. History of esophageal varices      Plan:   His Coumadin has been held. We will proceed with EGD to further evaluate the upper GI bleed. Lab Results  Component Value Date  HGB 8.7* 03/01/2014   HGB 11.9* 02/28/2014   HGB 11.4* 02/28/2014   HCT 26.5* 03/01/2014   HCT 35.0* 02/28/2014   HCT 34.3* 02/28/2014   ALKPHOS 79 02/28/2014   ALKPHOS 96 02/21/2014   ALKPHOS 141* 07/21/2013   AST 21 02/28/2014   AST 26 02/21/2014   AST 39* 07/21/2013   ALT 9 02/28/2014   ALT 16 02/21/2014   ALT 43 07/21/2013

## 2014-03-01 NOTE — Discharge Instructions (Signed)
Please take Protonix 40mg  a day to help heal your stomach. 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 I have listed the medications you should be taking on this paper.  DO NOT TAKE LOVENOX OR COUMADIN.

## 2014-03-01 NOTE — Anesthesia Procedure Notes (Signed)
Procedure Name: Intubation Date/Time: 03/01/2014 9:13 AM Performed by: Trixie Deis A Pre-anesthesia Checklist: Patient identified, Patient being monitored, Emergency Drugs available, Timeout performed and Suction available Patient Re-evaluated:Patient Re-evaluated prior to inductionOxygen Delivery Method: Circle system utilized Preoxygenation: Pre-oxygenation with 100% oxygen Intubation Type: IV induction Ventilation: Mask ventilation without difficulty Tube type: Oral Tube size: 7.5 mm Number of attempts: 1 Airway Equipment and Method: Stylet and Video-laryngoscopy ([place] 4) Placement Confirmation: ETT inserted through vocal cords under direct vision,  breath sounds checked- equal and bilateral and positive ETCO2 Secured at: 23 cm Tube secured with: Tape Dental Injury: Teeth and Oropharynx as per pre-operative assessment

## 2014-03-01 NOTE — H&P (Signed)
  Date: 03/01/2014  Patient name: Mark Christensen  Medical record number: [id number]  Date of birth: 07/18/1961   I have seen and evaluated Amalia Greenhouse and discussed their care with the Residency Team. Mr Whisenant has known cirrhosis 2/2 and has h/o eso bleeding and banding. His cirrhosis is reportedly 2/2 Hep B and Hep C but we have no documentation to support this here at [facility] or at [facility]. He sees a PCP outside of our system. He was D/C'd Phoebe Perch on anticoagulation (assume Lovenox followed by warfarin but D/C summary not clear) for an unrelated issue June 6th and was doing well until the AM of admission when he had vomiting of dark blood followed by large volume bright red blood emesis. He was brought to the ED and HgB was stable at 11.9. He was put on IV PPI and octreotide. He had an EGD this AM that showed small distal esophageal varices without obvious stigmata of bleeding. The bleeding was felt to be 2/2 scattered distal esophageal erosions and portal hypertensive gastropathy. His HgB had decreased to 8.7 this AM and vitals are stable.   Dr Margart Sickles outlined his recent [facility] hospitalization for cervical osteomyelitis, discitis, and epidural abscess, recent picc line infection with septic thrombophlebitis in his H&P.   Today, the pt is lying in bed in NAD. His vitals are stable. He has multiple tattoos. HRRR. ABD : + bs, soft. Neuro : no focal.   Labs ands imaging reviewed.   Assessment and Plan: I have seen and evaluated the patient as outlined above. I agree with the formulated Assessment and Plan as detailed in the residents' admission note, with the following changes:   1. UGI bleed 2/2 eso erosions and portal hypertensive gastropathy - HgB needs to be followed to ensure its stability, although I doubt it will drop further. Pt is adamant that he is going to leave as it is more important for him to be at home than here. If repeat HgB is stable, we will D/C the pt. Otherwise, we will encourage the pt  to stay but he does have capacity to sing out AMA if he desires. Cont PPI.   2. Candidemia - will cont the pt's diflucan   3. 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 Pt will not be D/C;'d on any anticoagulation and he has been explained the risks and benefits.   4. C6/7 discitis and osteo - pt to cont his ABX.  Bartholomew Crews, MD 6/8/20151:07 PM

## 2014-03-01 NOTE — Discharge Summary (Signed)
Name: Mark Christensen MRN: [id number] DOB: 09/27/1960 53 y.o. PCP: Leonard Downing, MD  Date of Admission: 02/28/2014  4:27 PM Date of Discharge: 03/01/2014 Attending Physician: Bartholomew Crews, MD  PATIENT LEFT Against Medical Advice.  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     clonazePAM 0.5 MG tablet  Commonly known as:  KLONOPIN  Take 0.5 mg by mouth at bedtime.     fluconazole 200 MG tablet  Commonly known as:  DIFLUCAN  Take 3 tablets (600 mg total) by mouth daily.     FLUoxetine 40 MG capsule  Commonly known as:  PROZAC  Take 40 mg by mouth daily.     nafcillin 2 g in dextrose 5 % 50 mL  Inject 2 g into the vein every 4 (four) hours.     oxyCODONE 15 MG immediate release tablet  Commonly known as:  ROXICODONE  Take 15 mg by mouth every 4 (four) hours as needed for pain.     pantoprazole 40 MG tablet  Commonly known as:  PROTONIX  Take 1 tablet (40 mg total) by mouth daily.     propranolol 10 MG tablet  Commonly known as:  INDERAL  Take 1 tablet (10 mg total) by mouth 3 (three) times daily.     rifampin 300 MG capsule  Commonly known as:  RIFADIN  Take 600 mg by mouth daily.     sennosides-docusate sodium 8.6-50 MG tablet  Commonly known as:  SENOKOT-S  Take 2 tablets by mouth 2 (two) times daily.        Disposition and follow-up:   Mark Christensen was discharged from [facility] in  [place] condition.  At the hospital follow up visit please address:  1.  Further hematemesis?  2. Ensure patient is recieving HH care, taking IV naficillin PO rifampin and Fluconazole.  Patient was initially instructed to then transition on June 20th to chronic suppressive therapy with PO doxycycline BID however this may need to be reassessed as patient will not be taking Warfarin as originally planned.  3. Left subclavian thrombosis: Bleeding on Lovenox, Lovenox and Warfarin discontinued.  2.  Labs / imaging needed at time of follow-up: CBC  3.  Pending labs/ test needing follow-up: None  Follow-up Appointments:     Follow-up Information   Follow up with Leonard Downing, MD. Schedule an appointment as soon as possible for a visit in 1 day. (for hospital follow up.)    Specialty:  Family Medicine   Contact information:   [place] [address] [phone]       Discharge Instructions: Discharge Instructions   Call MD for:  persistant dizziness or light-headedness    Complete by:  As directed      Call MD for:    Complete by:  As directed  Fast heart rate, bloodly stools, vomiting blood, or very dark stools.     Diet - low sodium heart healthy    Complete by:  As directed      Discharge instructions    Complete by:  As directed   DO NOT take Lovenox or coumadin. Please take Protonix 40mg  a day. Please follow up with PCP ASAP.     Increase activity slowly    Complete by:  As directed            Consultations: Treatment Team:  Missy Sabins, MD  Procedures Performed:  Ct Soft Tissue Neck W Contrast  02/21/2014   CLINICAL DATA:  Anterior left cervical fusion 01/15/2014, woke up this morning with left neck and upper left chest swelling and tenderness and redness with fullness sensation left-sided throat and tenderness when swallowing  EXAM: CT NECK WITH CONTRAST  TECHNIQUE: Multidetector CT imaging of the neck was performed using the standard  protocol following the bolus administration of intravenous contrast.  CONTRAST:  11mL OMNIPAQUE IOHEXOL 300 MG/ML  SOLN  COMPARISON:  07/22/2013  FINDINGS: C5 through T1 demonstrates anterior plate fusion with interbody fusion mass, in these postoperative materials appear well seated. There is a small collection of prevertebral space fluid at these levels but no abscess.  There is severe inflammation in the left neck deep to the sternocleidomastoid muscle from the level of C3 down into the supraclavicular fossa. Inflammatory change descends further into the anterior mediastinum down to the inferior-most level covered by this study at the level of the aortic arch. There are numerous enlarged lymph nodes throughout this area, the largest measuring 2 x 1.5 cm.  There is decreased attenuation of the left internal jugular vein concerning for thrombosis. At the junction of the jugular vein and subclavian vein, there is abnormal soft tissue gas within and surrounding the venous structures. There is gas and abnormal heterogeneous attenuation in the nonenhancing left subclavian vein and brachiocephalic vein. There is a left PICC line extending through this area with its tip terminating posterior to the sternum. As left subclavian and brachiocephalic veins do not enhance, it is technically impossible to confirm intravascular positioning of the PICC line. The course of the PICC line is generally associated with the abnormal gas and attenuation along the anticipated course of the subclavian and brachiocephalic veins.  Lung apices are clear.  There is no significant airway narrowing.  IMPRESSION: Severe inflammatory change suggesting infection deep to the sternocleidomastoid muscle extending from the mid cervical region into the base of the neck and into the mediastinum. There are numerous abnormally enlarged lymph nodes throughout the left base of the neck.  Absence of enhancement in the left internal jugular vein, visualized  portion of the subclavian vein, and brachiocephalic vein concerning for septic thrombophlebitis. There is a left-sided PICC line generally associated with the anticipated course of these venous structures, although intravascular location of the PICC line cannot be assured.  These results were called by telephone at the time of interpretation on 02/21/2014 at 6:25 PM to Dr. Marcelline Mates , who verbally acknowledged these results.   Electronically Signed   By: Skipper Cliche M.D.   On: 02/21/2014 18:26   Dg Chest Port 1 View  03/01/2014   CLINICAL DATA:  Evaluate for PICC line placement.  EXAM: PORTABLE CHEST - 1 VIEW  COMPARISON:  12/20/2013.  FINDINGS: Stable cardiomediastinal silhouette. Clear lung fields. PICC line has been placed from a right arm approach with tip in  the mid SVC. No pneumothorax. Previous cervical fusion.  IMPRESSION: PICC line tip mid SVC.  No pneumothorax.  Similar aeration to priors.  No active disease.   Electronically Signed   By: Rolla Flatten M.D.   On: 03/01/2014 00:46   Admission HPI: DANTRELL SCHERTZER is a 53 y.o. man with a pmhx of cirhossis c/b esophageal varices and multiple GI bleeds, recent cervical OM, discitis, and epidural abscess, recent picc line infection c/b septic thrombophlebitis who presents to the ED one day after being discharged from [facility] with a cc of vomiting blood. The patient was in his normal state of health until April when he developed worsening neck pain. He was found to have C6-7 discitis, OM and epidural abscess for which he underwent anterior C6-7 corpectomy and I&D on April 25th. Cultures were positive for MSSA. He was given a PICC line and discharged home with IV nafcillin and PO rifampin. He was planned to complete this course through June 20th. He was doing well at home on this regimen until late May when he developed a painful mass on the left side of his neck. He was admitted back to [facility] on May 31st with the diagnosis of septic  thrombophlebitis c/b candidemia. The picc line was removed and the patient was started on fluconazole. The patient was also started on anticoagulation for UE thrombosis. The patient was discharged on lovenox bridge to coumadin. The patient had not picked up the lovenox. His last dose was 02/27/14 in the am. He picked up an "anticoagulant" of unknown name. He did not take this medication today.  The patient did well at home last night. He tolerated chicken soup w.o problems. He denies alcohol or drug use. Admits to drinking coffee and smoking cigarettes. This morning he did not feel well upon waking. He had a cup of coffee. He then vomited a small amount of dark blood. The patient wasn't sure this was a true bleed. However, he began to feel worse and had a large volume emesis of bright rid blood with mixed clots. He and his wife state that he covered the bathroom in blood. This happened around 2 pm today. He then drove the the nearest ED. Since then he has felt much better and has had not return of nausea of bloody emesis. He denies melena and hematochezia. He admits to vague RUQ abdominal pain. He denies SOB, palpitations, Chest pain. Denies blood in urine.    Hospital Course by problem list:   Hematemesis - Patient with known history of cirrhosis and esophageal varices presented with hematemesis 1 day after he was discharged from [facility]. Where he was receiving Lovenox for a left subclavian vein thrombosis provoked by a PICC line placement.  He had reported he had not yet picked up his home lovenox injections and had not taken Warfarin yet at home.  He was started on a Protonix drip and well as octreotide.  His Hgb overnight dropped from 11.4 g/dL to 8.7 g/dL.  He was taken for EGD with Dr. Penelope Coop which revealed only mild non bleeding esophageal varices.  And protonix and octreotide drips were discontinued.  GI felt his bleed was likely due to esophageal erosions or portal hypertensive gastropathy  associated with Coumadin (althought he was not therapeutic).  He was continued on oral protonix.  His repeat CBC in the afternoon revealed a Hgb of 8.4 g/dL.  Patient reported he felt better and wanted to leave.  I extensively discussed  with the patient that I would not recommend leaving the hospital while his Hgb was still trending down.  He reported he understood the risks including death and major disability and choose to leave against medical advice.  I instructed him to not take any further doses of Lovenox and to not take Warfarin as his risk of further bleeding was too high.  He will continue on Protonix.    Left subclavian vein thrombosis - Provoked form PICC line placement.  Patient had just started Warfarin and was supposed to be bridging with Lovenox.  Given his serious GI bleed these were discontinued.  Of note A/C therapy catheter-induced upper extremity DVT is a weak recommendation (Grade 2B recommendation) and he is at high risk of re bleeding on A/C.     Candidemia -Patient apparently did not understand discharge instructions from [facility].  He was continued on fluconazole 600mg  daily.  He was instructed to continue this medication at home until 03/09/14.  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 Will complete 8 weeks ending June 20th. Case management communicated with [facility] care to ensure he will continue his treatment.  Of note he is supposed to start doxycycline 100mg  BID for chronic suppressive therapy, although this could be reconsider as this was chosen when ID assumed patient would be on coumadin therapy.   Discharge Vitals:   BP 105/71  Pulse 71  Temp(Src) 97.7 F (36.5 C) (Oral)  Resp 16  Ht 6\' 2"  (1.88 m)  Wt 183 lb 3.2 oz (83.1 kg)  BMI 23.51 kg/m2  SpO2 89%  Discharge Labs:  Results for orders placed  during the hospital encounter of 02/28/14 (from the past 24 hour(s))  PROTIME-INR     Status: Abnormal   Collection Time    02/28/14  4:28 PM      Result Value Ref Range   Prothrombin Time 15.4 (*) 11.6 - 15.2 seconds   INR 1.25  0.00 - 1.49  CBC     Status: Abnormal   Collection Time    02/28/14  4:28 PM      Result Value Ref Range   WBC 7.6  4.0 - 10.5 K/uL   RBC 3.88 (*) 4.22 - 5.81 MIL/uL   Hemoglobin 11.4 (*) 13.0 - 17.0 g/dL   HCT 34.3 (*) 39.0 - 52.0 %   MCV 88.4  78.0 - 100.0 fL   MCH 29.4  26.0 - 34.0 pg   MCHC 33.2  30.0 - 36.0 g/dL   RDW 17.3 (*) 11.5 - 15.5 %   Platelets 135 (*) 150 - 400 K/uL  COMPREHENSIVE METABOLIC PANEL     Status: Abnormal   Collection Time    02/28/14  4:28 PM      Result Value Ref Range   Sodium 134 (*) 137 - 147 mEq/L   Potassium 3.8  3.7 - 5.3 mEq/L   Chloride 98  96 - 112 mEq/L   CO2 23  19 - 32 mEq/L   Glucose, Bld 111 (*) 70 - 99 mg/dL   BUN 19  6 - 23 mg/dL   Creatinine, Ser 1.18  0.50 - 1.35 mg/dL   Calcium 9.6  8.4 - 10.5 mg/dL   Total Protein 8.5 (*) 6.0 - 8.3 g/dL   Albumin 2.4 (*) 3.5 - 5.2 g/dL   AST 21  0 - 37  U/L   ALT 9  0 - 53 U/L   Alkaline Phosphatase 79  39 - 117 U/L   Total Bilirubin 0.9  0.3 - 1.2 mg/dL   GFR calc non Af Amer 70 (*) >90 mL/min   GFR calc Af Amer 82 (*) >90 mL/min  TYPE AND SCREEN     Status: None   Collection Time    02/28/14  4:50 PM      Result Value Ref Range   ABO/RH(D) O POS     Antibody Screen NEG     Sample Expiration 03/03/2014    I-STAT CHEM 8, ED     Status: Abnormal   Collection Time    02/28/14  5:15 PM      Result Value Ref Range   Sodium 140  137 - 147 mEq/L   Potassium 3.8  3.7 - 5.3 mEq/L   Chloride 98  96 - 112 mEq/L   BUN 20  6 - 23 mg/dL   Creatinine, Ser 1.40 (*) 0.50 - 1.35 mg/dL   Glucose, Bld 112 (*) 70 - 99 mg/dL   Calcium, Ion 1.29 (*) 1.12 - 1.23 mmol/L   TCO2 24  0 - 100 mmol/L   Hemoglobin 11.9 (*) 13.0 - 17.0 g/dL   HCT 35.0 (*) 39.0 - 52.0 %  PREPARE  FRESH FROZEN PLASMA     Status: None   Collection Time    02/28/14  6:30 PM      Result Value Ref Range   Unit Number [id number]     Blood Component Type THAWED PLASMA     Unit division 00     Status of Unit ISSUED,FINAL     Transfusion Status OK TO TRANSFUSE     Unit Number [id number]     Blood Component Type THAWED PLASMA     Unit division 00     Status of Unit REL FROM [facility]     Transfusion Status OK TO TRANSFUSE    MRSA PCR SCREENING     Status: None   Collection Time    02/28/14 10:36 PM      Result Value Ref Range   MRSA by PCR NEGATIVE  NEGATIVE  LACTIC ACID, PLASMA     Status: None   Collection Time    03/01/14  3:32 AM      Result Value Ref Range   Lactic Acid, Venous 0.6  0.5 - 2.2 mmol/L  PHOSPHORUS     Status: None   Collection Time    03/01/14  3:33 AM      Result Value Ref Range   Phosphorus 4.2  2.3 - 4.6 mg/dL  BASIC METABOLIC PANEL     Status: Abnormal   Collection Time    03/01/14  3:33 AM      Result Value Ref Range   Sodium 136 (*) 137 - 147 mEq/L   Potassium 4.2  3.7 - 5.3 mEq/L   Chloride 100  96 - 112 mEq/L   CO2 26  19 - 32 mEq/L   Glucose, Bld 131 (*) 70 - 99 mg/dL   BUN 22  6 - 23 mg/dL   Creatinine, Ser 1.11  0.50 - 1.35 mg/dL   Calcium 9.0  8.4 - 10.5 mg/dL   GFR calc non Af Amer 76 (*) >90 mL/min   GFR calc Af Amer 88 (*) >90 mL/min  PROTIME-INR     Status: None   Collection Time    03/01/14  3:33 AM  Result Value Ref Range   Prothrombin Time 15.2  11.6 - 15.2 seconds   INR 1.23  0.00 - 1.49  MAGNESIUM     Status: None   Collection Time    03/01/14  3:33 AM      Result Value Ref Range   Magnesium 1.9  1.5 - 2.5 mg/dL  TROPONIN I     Status: None   Collection Time    03/01/14  3:34 AM      Result Value Ref Range   Troponin I <0.30  <0.30 ng/mL  CBC     Status: Abnormal   Collection Time    03/01/14  3:34 AM      Result Value Ref Range   WBC 3.5 (*) 4.0 - 10.5 K/uL   RBC 2.99 (*) 4.22 - 5.81 MIL/uL   Hemoglobin 8.7  (*) 13.0 - 17.0 g/dL   HCT 26.5 (*) 39.0 - 52.0 %   MCV 88.6  78.0 - 100.0 fL   MCH 29.1  26.0 - 34.0 pg   MCHC 32.8  30.0 - 36.0 g/dL   RDW 17.4 (*) 11.5 - 15.5 %   Platelets    150 - 400 K/uL   Value: PLATELET CLUMPS NOTED ON SMEAR, COUNT APPEARS DECREASED  GLUCOSE, CAPILLARY     Status: Abnormal   Collection Time    03/01/14  8:09 AM      Result Value Ref Range   Glucose-Capillary 121 (*) 70 - 99 mg/dL  CBC     Status: Abnormal   Collection Time    03/01/14 12:00 PM      Result Value Ref Range   WBC 3.2 (*) 4.0 - 10.5 K/uL   RBC 2.85 (*) 4.22 - 5.81 MIL/uL   Hemoglobin 8.5 (*) 13.0 - 17.0 g/dL   HCT 25.8 (*) 39.0 - 52.0 %   MCV 90.5  78.0 - 100.0 fL   MCH 29.8  26.0 - 34.0 pg   MCHC 32.9  30.0 - 36.0 g/dL   RDW 17.4 (*) 11.5 - 15.5 %   Platelets 72 (*) 150 - 400 K/uL    Signed: Joni Reining, DO 03/01/2014, 3:19 PM   Time Spent on Discharge: 35 minutes Services Ordered on Discharge: Resume HH RN Equipment Ordered on Discharge: none

## 2014-03-01 NOTE — Anesthesia Preprocedure Evaluation (Addendum)
Anesthesia Evaluation  Patient identified by MRN, date of birth, ID band Patient awake    Reviewed: Allergy & Precautions, H&P , NPO status , Patient's Chart, lab work & pertinent test results, reviewed documented beta blocker date and time   History of Anesthesia Complications (+) AWARENESS UNDER ANESTHESIA and history of anesthetic complications (awareness during office colonoscopy. Anesthetic at [facility] for colonoscopy and I&D at [facility] without issue.)  Airway Mallampati: II TM Distance: >3 FB Neck ROM: Limited   Comment: Cervical fusion 3 wks ago at [facility]  (+) Edentulous Upper, Partial Lower,    Pulmonary shortness of breath and with exertion, pneumonia -, resolved, Current Smoker,  breath sounds clear to auscultation        Cardiovascular hypertension (propanolol 0023), Pt. on home beta blockers - angina- CHF - dysrhythmias Rhythm:Regular     Neuro/Psych  Headaches, PSYCHIATRIC DISORDERS Anxiety Blind right eye    GI/Hepatic GERD-  Medicated and Controlled,(+) Cirrhosis -      , Hepatitis -, C, B  Endo/Other  negative endocrine ROS  Renal/GU negative Renal ROS     Musculoskeletal   Abdominal   Peds  Hematology  (+) anemia ,   Anesthesia Other Findings  pmhx of cirhossis c/b esophageal varices and multiple GI bleeds, recent cervical OM, discitis, and epidural abscess, recent picc line infection c/b septic thrombophlebitis   Reproductive/Obstetrics                          Anesthesia Physical Anesthesia Plan  ASA: III  Anesthesia Plan:    Post-op Pain Management:    Induction: Intravenous  Airway Management Planned: Oral ETT  Additional Equipment: None  Intra-op Plan:   Post-operative Plan: Extubation in OR  Informed Consent: I have reviewed the patients History and Physical, chart, labs and discussed the procedure including the risks, benefits and alternatives for the proposed  anesthesia with the patient or authorized representative who has indicated his/her understanding and acceptance.   Dental advisory given  Plan Discussed with: Surgeon and CRNA  Anesthesia Plan Comments:         Anesthesia Quick Evaluation

## 2014-03-01 NOTE — Progress Notes (Signed)
Subjective: [name] was lying in bed in no distress, feeling well following his EGD procedure.  Impression of the study was that the esophageal varices were likely not the cause of his hematemesis, and instead was more likely due to esophageal  erosions or portal hypertensive gastropathy associated with his being on Coumadin.  He was pleased to hear this news, and expressed strong desire to return home, as he has already had a long hospital stay.  We discussed the need to reestablish HH, and that might take some time.  He understood, but would like to go home regardless.  He says that he has excellent support at home, and feels safe there.     Objective: Vital signs in last 24 hours: Filed Vitals:   03/01/14 1010 03/01/14 1020 03/01/14 1030 03/01/14 1205  BP: 99/75 103/65 105/71   Pulse: 66 68 71   Temp:    97.7 F (36.5 C)  TempSrc:    Oral  Resp: 13 14 16    Height:      Weight:      SpO2: 96% 90% 89%    Weight change:   Intake/Output Summary (Last 24 hours) at 03/01/14 1209 Last data filed at 03/01/14 1100  Gross per 24 hour  Intake   2875 ml  Output    250 ml  Net   2625 ml   General appearance: alert, cooperative, appears older than stated age and no distress Neck: no carotid bruit, no JVD, supple, symmetrical, trachea midline and L sided bruising and residual swellng 2/2 recent surgery Lungs: clear to auscultation bilaterally Heart: regular rate and rhythm, S1, S2 normal, no murmur, click, rub or gallop Abdomen: soft, mild RUQ tendernes (reported baseline), mild fluctuant distension Extremities: 1 Pulses: 2+ and symmetric Skin: Skin color, texture, turgor normal. No rashes or lesions Neurologic: Mental status: Alert, oriented, thought content appropriate Cranial nerves: III,IV,VI: extraocular muscles extra-ocular motions intact, V: facial light touch sensation normal bilaterally, VII: upper facial muscle function normal bilaterally, VII: lower facial muscle function  normal bilaterally, IX: soft palate elevation normal in midline, XI: trapezius strength normal bilaterally, XII: tongue strength normal  Sensory: normal Motor: 4+ R bicep 4- R tricep grade 5 arm(s) left  grade 5 ankle(s) bilateral  Coordination: finger to nose normal bilaterally Lab Results: Results for ADIS, STURGILL (MRN [id number]) as of 03/01/2014 12:51  Ref. Range 03/01/2014 03:32 03/01/2014 03:33 03/01/2014 03:34 03/01/2014 08:09 03/01/2014 09:34  Glucose-Capillary Latest Range: 70-99 mg/dL    121 (H)   Sodium Latest Range: 137-147 mEq/L  136 (L)     Potassium Latest Range: 3.7-5.3 mEq/L  4.2     Chloride Latest Range: 96-112 mEq/L  100     CO2 Latest Range: 19-32 mEq/L  26     BUN Latest Range: 6-23 mg/dL  22     Creatinine Latest Range: 0.50-1.35 mg/dL  1.11     Calcium Latest Range: 8.4-10.5 mg/dL  9.0     GFR calc non Af Amer Latest Range: >90 mL/min  76 (L)     Glucose Latest Range: 70-99 mg/dL  131 (H)     Phosphorus Latest Range: 2.3-4.6 mg/dL  4.2     Magnesium Latest Range: 1.5-2.5 mg/dL  1.9     Troponin I Latest Range: <0.30 ng/mL   <0.30    Lactic Acid, Venous Latest Range: 0.5-2.2 mmol/L 0.6      WBC Latest Range: 4.0-10.5 K/uL   3.5 (L)    RBC Latest Range:  4.22-5.81 MIL/uL   2.99 (L)    Hemoglobin Latest Range: 13.0-17.0 g/dL   8.7 (L)    HCT Latest Range: 39.0-52.0 %   26.5 (L)    MCV Latest Range: 78.0-100.0 fL   88.6    MCH Latest Range: 26.0-34.0 pg   29.1    MCHC Latest Range: 30.0-36.0 g/dL   32.8    RDW Latest Range: 11.5-15.5 %   17.4 (H)    Platelets Latest Range: 150-400 K/uL   PLATELET CLUMPS NOTED ON SMEAR, COUNT APPEARS DECREASED    Prothrombin Time Latest Range: 11.6-15.2 seconds  15.2     INR Latest Range: 0.00-1.49   1.23      Micro Results: Recent Results (from the past 240 hour(s))  CULTURE, BLOOD (ROUTINE X 2)     Status: None   Collection Time    02/21/14  4:28 PM      Result Value Ref Range Status   Specimen Description BLOOD RIGHT ARM   Final     Special Requests BOTTLES DRAWN AEROBIC AND ANAEROBIC 10CC   Final   Culture  Setup Time     Final   Value: 02/21/2014 21:46     Performed at [facility]   Culture     Final   Value: CANDIDA TROPICALIS     Note: Gram Stain Report Called to,Read Back By and Verified With: [place] ON 02/22/14 AT 10:48P.M. GBTDV     Performed at [facility]   Report Status 02/25/2014 FINAL   Final  CULTURE, BLOOD (ROUTINE X 2)     Status: None   Collection Time    02/21/14  4:36 PM      Result Value Ref Range Status   Specimen Description BLOOD RIGHT HAND   Final   Special Requests BOTTLES DRAWN AEROBIC AND ANAEROBIC 10CC   Final   Culture  Setup Time     Final   Value: 02/21/2014 21:45     Performed at [facility]   Culture     Final   Value: NO GROWTH 5 DAYS     Performed at [facility]   Report Status 02/27/2014 FINAL   Final  MRSA PCR SCREENING     Status: None   Collection Time    02/28/14 10:36 PM      Result Value Ref Range Status   MRSA by PCR NEGATIVE  NEGATIVE Final   Comment:            The GeneXpert MRSA Assay (FDA     approved for NASAL specimens     only), is one component of a     comprehensive MRSA colonization     surveillance program. It is not     intended to diagnose MRSA     infection nor to guide or     monitor treatment for     MRSA infections.   Studies/Results: Dg Chest Port 1 View  03/01/2014   CLINICAL DATA:  Evaluate for PICC line placement.  EXAM: PORTABLE CHEST - 1 VIEW  COMPARISON:  12/20/2013.  FINDINGS: Stable cardiomediastinal silhouette. Clear lung fields. PICC line has been placed from a right arm approach with tip in the mid SVC. No pneumothorax. Previous cervical fusion.  IMPRESSION: PICC line tip mid SVC.  No pneumothorax.  Similar aeration to priors.  No active disease.   Electronically Signed   By: Rolla Flatten M.D.   On: 03/01/2014 00:46  EGD 02/27/2014: FINDINGS:  Esophagus: There were  small distal esophageal varices without  obvious stigmata of bleeding. There were scattered distal  esophageal erosions which were not bleeding.  Stomach: Portal hypertensive gastropathy. No signs of active  bleeding.  Duodenum: Normal  COMPLICATIONS:none  ENDOSCOPIC IMPRESSION:see above  RECOMMENDATIONS:PPI therapy. I do not think that this was a variceal  bleed. I think more likely he had bleeding from the esophageal  erosions or portal hypertensive gastropathy associated with his  being on Coumadin.  Medications: I have reviewed the patient's current medications. Scheduled Meds: . amitriptyline  25 mg Oral QHS  . clonazePAM  0.5 mg Oral QHS  . fluconazole  600 mg Oral Daily  . FLUoxetine  40 mg Oral Daily  . nafcillin 2 GM IVPB  2 g Intravenous 6 times per day  . [START ON 03/04/2014] pantoprazole (PROTONIX) IV  40 mg Intravenous Q12H  . propranolol  10 mg Oral TID  . rifampin  600 mg Oral Daily  . sodium chloride  10-40 mL Intracatheter Q12H   Continuous Infusions: . sodium chloride 100 mL/hr at 03/01/14 0022  . octreotide (SANDOSTATIN) infusion 25 mcg/hr (02/28/14 1822)  . pantoprozole (PROTONIX) infusion 8 mg/hr (03/01/14 0729)   PRN Meds:.fentaNYL, ondansetron (ZOFRAN) IV, oxyCODONE, sodium chloride Assessment/Plan: Principal Problem:   Hematemesis Active Problems:   Hepatitis C   Hepatitis B   Cirrhosis of liver   Hypertension   Esophageal varices in cirrhosis   Restless leg syndrome   Left subclavian vein thrombosis   Candidemia   Discitis of cervical region   GIB (gastrointestinal bleeding)  #: Hematemesis: Most likely due to upper GI bleeding secondary to esophageal erosions, possibly due to portal hypertensive gastropathy per Gastroenterology. Patient has hx of hepatitis B and C, and hx of IV drug use. He developed cirrhosis as evidenced by [place] on 05/18/13. He had hx of of esophageal varices which were banded previously (03/07/13 and 02/03/13). Recently, he was  started on coumadin for left subclavian thrombosis in the thrombophlebitis, which is likely the triggering factor this time GIB. Hgb 12.4 on 02/21/14-->8.7. Tachycardic at presentation, given 1U FFP in ED, VSS since. His INR is 1.23. Trop neg, Lactate 0.6, Phos 4.2, Mg 1.9 - Will transfer to Med-surg - GI consulted by ED, EGD performed, recommend PPI, thank you for rec's - increase NS at 75 mL/hr  - continue IV pantoprazole  - Discontinue octreotide (not likely variceal bleed) - Zofran IV for nausea  - patient need IV Rocephin 1 g X 7 days for SBP PPx, but he is on IV nafcillin, and oral rifampin for his discitis, will continue and not start IV Rocephin now.  - Avoid NSAIDs  - d/c lovenox and avoid SQ heparin  - Maintain IV access (2 large bore IVs if possible).  - continue BB, propranolol 10 mg tid, will discontinue if his blood pressure drops.  - Monitor closely and follow q12h cbc, transfuse as necessary.   # Recent Candidemia: Patient with blood culture positive for Candida tropicalis. Likely source was from PICC line which was removed. He was started on IV fluconazole which was transitioned to PO fluconazole with plan for treatment ending 03/09/2014.  Unclear if he filled this as an outpatient.  Will restart during this hospitalization, and discuss the importance of continuing upon discharge.    # Thrombosis of the left subclavian and brachiocephalic veins with thrombophlebitis: this was likely secondary to his PICC line, which was removed. He was anticoagulated for  6d as inpatient. Lovenox was D/c'd for current GI bleed pending evaluation by Gastroenterology.  Their team felt his bleed was not variceal, but agreed that it was related to his anticoagulation therapy.  Given degree of Hg decrease, and weak recommendation for catheter-induced upper extremity DVT (Grade 2B recommendation), we feel the risk of bleeding in Mr. Decandia outweighs the benefit of anticoagulation.  - will avoid  anticoagulation - consider obtaining repeat duplex doppler studies if S&Ss return, and reassess risk/benefit profile  # C6-7 discitis/osteomyelitis s/p corpectomy with irrigation & debridement (01/16/14): MSSA grown from cultures from corpectomy and I&D on 01/16/14. The patient followed with ID, and has been on nafcillin and rifampin IV via left PICC (placed 01/20/14). PICC was removed due to above and anti-biotics continued through PO and PIV. Repeat cultures drawn with no bacterial growth per d/c summary. The patient continued IV nafcillin 2 gm q4h and PO rifampin 300 mg BID with a plan to complete 8 week course ending June 20th. Will need to reassess initiation of oral doxycycline 100 mg po BID on 6/21 for chronic oral suppressive therapy (agent chosen to minimize potential interactions with warfarin, but will no longer be n warfarin therapy). Follow-up with ID as outpatient.  # Cirrhosis: has hx of hepatitis B and C and hx of IVDU with Cirrhotic liver with splenomegaly noted on imaging. HIV was negative. He is on propranolol 10 mg q8h, spironolactone 50 mg daily with Bowel regimen as needed.  -will d/c spironolactone in the setting of GIB, until Hg stabilized  -will continue propranolol   # Restless leg syndrome: Continued clonazepam 0.5 mg TID prn and Amitriptyiline 25 mg nightly prn   # Depression: Continued home fluoxetine 40 mg daily   DVT: SCDs   Diet:  Clear liquid   Dispo: Disposition is deferred at this time, awaiting improvement of current medical problems. Anticipated discharge in approximately 1-2 day(s).     This is a Careers information officer Note.  The care of the patient was discussed with Dr. Heber [place] and the assessment and plan formulated with their assistance.  Please see their attached note for official documentation of the daily encounter.   LOS: 1 day   Barbie Haggis, Med Student 03/01/2014, 12:09 PM

## 2014-03-01 NOTE — Anesthesia Postprocedure Evaluation (Signed)
  Anesthesia Post-op Note  Patient: Mark Christensen  Procedure(s) Performed: Procedure(s): ESOPHAGOGASTRODUODENOSCOPY (EGD) (N/A)  Patient Location: Endoscopy Unit  Anesthesia Type:General  Level of Consciousness: awake, alert  and oriented  Airway and Oxygen Therapy: Patient Spontanous Breathing  Post-op Pain: none  Post-op Assessment: Post-op Vital signs reviewed, Patient's Cardiovascular Status Stable, Respiratory Function Stable, Patent Airway and No signs of Nausea or vomiting  Post-op Vital Signs: Reviewed and stable  Last Vitals:  Filed Vitals:   03/01/14 1000  BP: 103/62  Pulse: 65  Temp:   Resp: 13    Complications: No apparent anesthesia complications

## 2014-03-01 NOTE — Op Note (Signed)
[facility] [place], [id number]   ENDOSCOPY PROCEDURE REPORT  PATIENT: Mark Christensen, Mark Christensen  MR#: [id number] BIRTHDATE: 01/26/1961 , 52  yrs. old GENDER: Male ENDOSCOPIST: Acquanetta Sit, MD REFERRED BY: PROCEDURE DATE:  03/01/2014 PROCEDURE:   EGD ASA CLASS: 3 INDICATIONS: hematemesis MEDICATIONS: sedation per anesthesia TOPICAL ANESTHETIC:  DESCRIPTION OF PROCEDURE:   After the risks benefits and alternatives of the procedure were thoroughly explained, informed consent was obtained.  The Pentax Gastroscope [id number]  endoscope was introduced through the mouth and advanced to the second portion of the duodenum      , limited by Without limitations.   The instrument was slowly withdrawn as the mucosa was fully examined.      FINDINGS:  Esophagus: There were small distal esophageal varices without obvious stigmata of bleeding. There were scattered distal esophageal erosions which were not bleeding.  Stomach: Portal hypertensive gastropathy. No signs of active bleeding.  Duodenum: Normal  COMPLICATIONS:none  ENDOSCOPIC IMPRESSION:see above   RECOMMENDATIONS:PPI therapy. I do not think that this was a variceal bleed. I think more likely he had bleeding from the esophageal erosions or portal hypertensive gastropathy associated with his being on Coumadin.      _______________________________ Lorrin MaisAcquanetta Sit, MD 03/01/2014 9:33 AM       PATIENT NAME:  [name], [name] MR#: [id number]

## 2014-03-01 NOTE — Transfer of Care (Signed)
Immediate Anesthesia Transfer of Care Note  Patient: Mark Christensen  Procedure(s) Performed: Procedure(s): ESOPHAGOGASTRODUODENOSCOPY (EGD) (N/A)  Patient Location: Endoscopy Unit  Anesthesia Type:General  Level of Consciousness: sedated  Airway & Oxygen Therapy: Patient Spontanous Breathing and Patient connected to nasal cannula oxygen  Post-op Assessment: Report given to PACU RN and Post -op Vital signs reviewed and stable  Post vital signs: Reviewed and stable  Complications: No apparent anesthesia complications

## 2014-03-02 ENCOUNTER — Encounter (HOSPITAL_COMMUNITY): Payer: Self-pay | Admitting: Gastroenterology

## 2015-01-21 ENCOUNTER — Emergency Department (HOSPITAL_COMMUNITY)
Admission: EM | Admit: 2015-01-21 | Discharge: 2015-01-21 | Disposition: A | Discharge status: Home / Self Care | Payer: No Typology Code available for payment source | Attending: Emergency Medicine | Admitting: Emergency Medicine

## 2015-01-21 ENCOUNTER — Encounter (HOSPITAL_COMMUNITY): Payer: Self-pay | Admitting: *Deleted

## 2015-01-21 DIAGNOSIS — Z792 Long term (current) use of antibiotics: Secondary | ICD-10-CM | POA: Diagnosis not present

## 2015-01-21 DIAGNOSIS — Y92481 Parking lot as the place of occurrence of the external cause: Secondary | ICD-10-CM | POA: Insufficient documentation

## 2015-01-21 DIAGNOSIS — S3992XA Unspecified injury of lower back, initial encounter: Secondary | ICD-10-CM | POA: Diagnosis not present

## 2015-01-21 DIAGNOSIS — Y998 Other external cause status: Secondary | ICD-10-CM | POA: Diagnosis not present

## 2015-01-21 DIAGNOSIS — Z8701 Personal history of pneumonia (recurrent): Secondary | ICD-10-CM | POA: Diagnosis not present

## 2015-01-21 DIAGNOSIS — M1990 Unspecified osteoarthritis, unspecified site: Secondary | ICD-10-CM | POA: Insufficient documentation

## 2015-01-21 DIAGNOSIS — I10 Essential (primary) hypertension: Secondary | ICD-10-CM | POA: Diagnosis not present

## 2015-01-21 DIAGNOSIS — T07XXXA Unspecified multiple injuries, initial encounter: Secondary | ICD-10-CM

## 2015-01-21 DIAGNOSIS — Z720 Tobacco use: Secondary | ICD-10-CM | POA: Diagnosis not present

## 2015-01-21 DIAGNOSIS — K219 Gastro-esophageal reflux disease without esophagitis: Secondary | ICD-10-CM | POA: Insufficient documentation

## 2015-01-21 DIAGNOSIS — F419 Anxiety disorder, unspecified: Secondary | ICD-10-CM | POA: Diagnosis not present

## 2015-01-21 DIAGNOSIS — Y9389 Activity, other specified: Secondary | ICD-10-CM | POA: Diagnosis not present

## 2015-01-21 DIAGNOSIS — Z8619 Personal history of other infectious and parasitic diseases: Secondary | ICD-10-CM | POA: Diagnosis not present

## 2015-01-21 DIAGNOSIS — Z8669 Personal history of other diseases of the nervous system and sense organs: Secondary | ICD-10-CM | POA: Insufficient documentation

## 2015-01-21 DIAGNOSIS — Z79899 Other long term (current) drug therapy: Secondary | ICD-10-CM | POA: Insufficient documentation

## 2015-01-21 DIAGNOSIS — T148 Other injury of unspecified body region: Secondary | ICD-10-CM | POA: Insufficient documentation

## 2015-01-21 DIAGNOSIS — S79911A Unspecified injury of right hip, initial encounter: Secondary | ICD-10-CM | POA: Diagnosis not present

## 2015-01-21 MED ORDER — DIAZEPAM 5 MG PO TABS
10.0000 mg | ORAL_TABLET | Freq: Once | ORAL | Status: AC
  Administered 2015-01-21: 10 mg via ORAL
  Filled 2015-01-21: qty 2

## 2015-01-21 MED ORDER — DIAZEPAM 10 MG PO TABS: 10.0000 mg | ORAL_TABLET | Freq: Four times a day (QID) | ORAL | Status: AC | PRN
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
## 2015-01-21 NOTE — ED Notes (Signed)
Pt reports no pain to hip.  Ambulated well to the restroom.
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
## 2015-06-13 ENCOUNTER — Encounter (HOSPITAL_COMMUNITY): Payer: Self-pay | Admitting: Emergency Medicine

## 2015-06-13 ENCOUNTER — Emergency Department (HOSPITAL_COMMUNITY)
Admission: EM | Admit: 2015-06-13 | Discharge: 2015-06-13 | Disposition: A | Discharge status: Home / Self Care | Payer: Medicaid Other | Attending: Emergency Medicine | Admitting: Emergency Medicine

## 2015-06-13 DIAGNOSIS — K219 Gastro-esophageal reflux disease without esophagitis: Secondary | ICD-10-CM | POA: Insufficient documentation

## 2015-06-13 DIAGNOSIS — Z79899 Other long term (current) drug therapy: Secondary | ICD-10-CM | POA: Insufficient documentation

## 2015-06-13 DIAGNOSIS — Z8619 Personal history of other infectious and parasitic diseases: Secondary | ICD-10-CM | POA: Insufficient documentation

## 2015-06-13 DIAGNOSIS — L02414 Cutaneous abscess of left upper limb: Secondary | ICD-10-CM | POA: Insufficient documentation

## 2015-06-13 DIAGNOSIS — Z720 Tobacco use: Secondary | ICD-10-CM | POA: Insufficient documentation

## 2015-06-13 DIAGNOSIS — F419 Anxiety disorder, unspecified: Secondary | ICD-10-CM | POA: Insufficient documentation

## 2015-06-13 DIAGNOSIS — D696 Thrombocytopenia, unspecified: Secondary | ICD-10-CM | POA: Insufficient documentation

## 2015-06-13 DIAGNOSIS — I10 Essential (primary) hypertension: Secondary | ICD-10-CM | POA: Insufficient documentation

## 2015-06-13 DIAGNOSIS — M1990 Unspecified osteoarthritis, unspecified site: Secondary | ICD-10-CM | POA: Insufficient documentation

## 2015-06-13 DIAGNOSIS — L0291 Cutaneous abscess, unspecified: Secondary | ICD-10-CM

## 2015-06-13 DIAGNOSIS — Z8701 Personal history of pneumonia (recurrent): Secondary | ICD-10-CM | POA: Insufficient documentation

## 2015-06-13 LAB — BASIC METABOLIC PANEL
ANION GAP: 6 (ref 5–15)
BUN: 17 mg/dL (ref 6–20)
CO2: 22 mmol/L (ref 22–32)
CREATININE: 0.99 mg/dL (ref 0.61–1.24)
Calcium: 9.3 mg/dL (ref 8.9–10.3)
Chloride: 109 mmol/L (ref 101–111)
GFR calc Af Amer: >60 mL/min (ref >60)
GFR calc non Af Amer: >60 mL/min (ref >60)
Glucose, Bld: 104 mg/dL — ABNORMAL HIGH (ref 65–99)
POTASSIUM: 3.9 mmol/L (ref 3.5–5.1)
SODIUM: 137 mmol/L (ref 135–145)

## 2015-06-13 LAB — CBC WITH DIFFERENTIAL/PLATELET
BASOS ABS: 0 10*3/uL (ref 0.0–0.1)
BASOS PCT: 0 %
EOS ABS: 0.1 10*3/uL (ref 0.0–0.7)
Eosinophils Relative: 2 %
HCT: 38.7 % — ABNORMAL LOW (ref 39.0–52.0)
HEMOGLOBIN: 13.5 g/dL (ref 13.0–17.0)
Lymphocytes Relative: 31 %
Lymphs Abs: 0.9 10*3/uL (ref 0.7–4.0)
MCH: 29.9 pg (ref 26.0–34.0)
MCHC: 34.9 g/dL (ref 30.0–36.0)
MCV: 85.8 fL (ref 78.0–100.0)
Monocytes Absolute: 0.3 10*3/uL (ref 0.1–1.0)
Monocytes Relative: 12 %
NEUTROS PCT: 55 %
Neutro Abs: 1.6 10*3/uL — ABNORMAL LOW (ref 1.7–7.7)
Platelets: 42 10*3/uL — ABNORMAL LOW (ref 150–400)
RBC: 4.51 MIL/uL (ref 4.22–5.81)
RDW: 14.8 % (ref 11.5–15.5)
WBC: 3 10*3/uL — AB (ref 4.0–10.5)

## 2015-06-13 MED ORDER — LIDOCAINE-EPINEPHRINE (PF) 2 %-1:200000 IJ SOLN
20.0000 mL | Freq: Once | INTRAMUSCULAR | Status: AC
  Administered 2015-06-13: 20 mL via INTRADERMAL
  Filled 2015-06-13: qty 20

## 2015-06-13 MED ORDER — OXYCODONE HCL 5 MG PO TABS: 5.0000 mg | ORAL_TABLET | ORAL | Status: AC | PRN

## 2015-06-13 MED ORDER — OXYCODONE HCL 5 MG PO TABS
5.0000 mg | ORAL_TABLET | Freq: Once | ORAL | Status: AC
  Administered 2015-06-13: 5 mg via ORAL
  Filled 2015-06-13: qty 1

## 2015-06-13 MED ORDER — SULFAMETHOXAZOLE-TRIMETHOPRIM 800-160 MG PO TABS
1.0000 | ORAL_TABLET | Freq: Once | ORAL | Status: AC
  Administered 2015-06-13: 1 via ORAL
  Filled 2015-06-13: qty 1

## 2015-06-13 MED ORDER — SULFAMETHOXAZOLE-TRIMETHOPRIM 800-160 MG PO TABS: 1.0000 | ORAL_TABLET | Freq: Two times a day (BID) | ORAL | Status: AC

## 2015-06-13 NOTE — ED Notes (Signed)
Pt from home for eval of abscess to left antecubital x1 week, pt states he has hx of getting "infections and abscesses". Pt denies using any IV drug use, states "I use to a long time ago but not recently." area red and tender to touch. Pt also reports pain to left pointer finger x1 week after closing it int he door. Pt denies any n/v/d at this time.
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
## 2015-06-13 NOTE — Discharge Instructions (Signed)
Please read and follow all provided instructions.  Your diagnoses today include:  1. Abscess   2. Thrombocytopenia     Tests performed today include:  Vital signs. See below for your results today.   Medications prescribed:   Bactrim (trimethoprim/sulfamethoxazole) - antibiotic  You have been prescribed an antibiotic medicine: take the entire course of medicine even if you are feeling better. Stopping early can cause the antibiotic not to work.   Oxycodone - narcotic pain medication  DO NOT drive or perform any activities that require you to be awake and alert because this medicine can make you drowsy.   Take any prescribed medications only as directed.   Home care instructions:   Follow any educational materials contained in this packet  Follow-up instructions: Return to the Emergency Department in 48 hours for a recheck if your symptoms are not significantly improved.  Please follow-up with your primary care provider in the next 1 week for further evaluation of your symptoms.   Return instructions:  Return to the Emergency Department if you have:  Fever  Worsening symptoms  Worsening pain  Worsening swelling  Redness of the skin that moves away from the affected area, especially if it streaks away from the affected area   Any other emergent concerns  Your vital signs today were: BP 136/89 mmHg   Pulse 73   Temp(Src) 98 F (36.7 C) (Oral)   Resp 17   Ht 6\' 2"  (1.88 m)   Wt 179 lb 3.2 oz (81.285 kg)   BMI 23.00 kg/m2   SpO2 100% If your blood pressure (BP) was elevated above 135/85 this visit, please have this repeated by your doctor within one month. --------------

## 2017-07-10 ENCOUNTER — Ambulatory Visit
Admission: RE | Admit: 2017-07-10 | Discharge: 2017-07-10 | Disposition: A | Discharge status: Home / Self Care | Payer: Commercial Managed Care - PPO | Attending: Gastroenterology | Admitting: Gastroenterology

## 2017-07-10 DIAGNOSIS — K7030 Alcoholic cirrhosis of liver without ascites: Principal | ICD-10-CM

## 2017-07-14 MED ORDER — RIFAXIMIN 550 MG TABLET: 550 mg | tablet | 11 refills | 0 days

## 2017-07-14 MED ORDER — RIFAXIMIN 550 MG TABLET
ORAL_TABLET | Freq: Two times a day (BID) | ORAL | 11 refills | 0.00000 days | Status: CP
Start: 2017-07-14 — End: 2018-07-14

## 2017-07-17 MED ORDER — ESOMEPRAZOLE MAGNESIUM 20 MG CAPSULE,DELAYED RELEASE: capsule | 1 refills | 0 days

## 2017-07-17 MED ORDER — FUROSEMIDE 40 MG TABLET
ORAL_TABLET | Freq: Every day | ORAL | 1 refills | 0.00000 days | Status: CP
Start: 2017-07-17 — End: 2017-07-17

## 2017-07-17 MED ORDER — DOCUSATE SODIUM 100 MG CAPSULE
ORAL_CAPSULE | Freq: Every day | ORAL | 1 refills | 0.00000 days | Status: CP
Start: 2017-07-17 — End: 2017-07-17

## 2017-07-17 MED ORDER — LACTULOSE 10 GRAM/15 ML ORAL SOLUTION: 20 g | mL | Freq: Three times a day (TID) | 1 refills | 0 days | Status: AC

## 2017-07-17 MED ORDER — SPIRONOLACTONE 100 MG TABLET: 100 mg | tablet | Freq: Every day | 1 refills | 0 days | Status: AC

## 2017-07-17 MED ORDER — ESOMEPRAZOLE MAGNESIUM 20 MG CAPSULE,DELAYED RELEASE
ORAL_CAPSULE | Freq: Every day | ORAL | 1 refills | 0.00000 days | Status: CP
Start: 2017-07-17 — End: 2018-07-17

## 2017-07-17 MED ORDER — DOCUSATE SODIUM 100 MG CAPSULE: 100 mg | capsule | 1 refills | 0 days

## 2017-07-17 MED ORDER — SPIRONOLACTONE 100 MG TABLET
ORAL_TABLET | Freq: Every day | ORAL | 1 refills | 0.00000 days | Status: CP
Start: 2017-07-17 — End: 2017-07-17

## 2017-07-17 MED ORDER — FUROSEMIDE 40 MG TABLET: 40 mg | tablet | 1 refills | 0 days

## 2017-07-17 MED ORDER — LACTULOSE 10 GRAM/15 ML ORAL SOLUTION
Freq: Three times a day (TID) | ORAL | 10 refills | 0.00000 days | Status: CP
Start: 2017-07-17 — End: 2018-07-17

## 2017-07-18 NOTE — Unmapped (Signed)
-----   Message from Annie Paras, MD sent at 07/14/2017  3:05 PM EDT -----  Rockwell [place],  Can you call this guy and check on him? 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 He also was out of Xifaxin and now has no insurance. I sent a request to [facility] to help with MAP for Xifaxin as he really needs it.     I would like to stop his oral iron since it is constipating and that may be making problem worse. Beside Fe numbers were normal in past few months.    Also, he needs a TIPS doppler US locally at [facility]. I wonder if there is a way to schedule that for him?    Thanks,  [employer]

## 2017-07-18 NOTE — Unmapped (Signed)
Spoke with patient. He stated that he has been very ill for the past few days and has nausea and vomiting. He feels very weak and tired. He takes care of his wife but he is having trouble doing that.   He said he is drinking juices and fixing meals for his wife.   He needs refills on all of his medications. I told him I will send in refills for the medications that Dr. Piedad Climes prescribes for him He asked that they get sent to the pharmacy at [facility]  Instructed the patient to stop taking the iron supplement that he has been taking as it is probably causing constipation and not helping him much right now.  I also told him that he might be dehydrated and need to be seen by a local MD to check him out. He said he will have his son take him to the hospital.  Will share this information with Dr. Piedad Climes.

## 2017-07-19 MED ORDER — FUROSEMIDE 40 MG TABLET
ORAL_TABLET | Freq: Every day | ORAL | 1 refills | 0.00000 days | Status: CP
Start: 2017-07-19 — End: 2018-01-15

## 2017-07-19 MED ORDER — SPIRONOLACTONE 100 MG TABLET
ORAL_TABLET | Freq: Every day | ORAL | 1 refills | 0 days | Status: CP
Start: 2017-07-19 — End: 2018-01-15

## 2017-07-22 NOTE — Unmapped (Signed)
Called insurance listed on chart. Patient coverage terminated 06/21/17.  Called [facility] to schedule self-pay [place] - would not schedule.  [facility]. Was told that patient will need to apply for charity care before the [place] can be scheduled.  Called patient - phone disconnected.  Called patient's wife - Left message on machine to call back.

## 2017-10-08 NOTE — Unmapped (Signed)
Patient called to see if his application for pharmacy assistance has been approved. He is out of all of his medications and cannot afford to get any refills.  Ambulance person and was given a phone number to call- not in office. E-mail to pharmacist.
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
## 2019-07-26 DEATH — deceased
